# Patient Record
Sex: Female | Born: 1955 | Race: White | Hispanic: No | Smoking: Former smoker
Health system: Southern US, Community
[De-identification: ages and names within clinical notes are randomized; demographics above are authoritative.]

## PROBLEM LIST (undated history)

## (undated) DIAGNOSIS — I639 Cerebral infarction, unspecified: Secondary | ICD-10-CM

## (undated) DIAGNOSIS — J45909 Unspecified asthma, uncomplicated: Secondary | ICD-10-CM

## (undated) DIAGNOSIS — E0590 Thyrotoxicosis, unspecified without thyrotoxic crisis or storm: Secondary | ICD-10-CM

## (undated) DIAGNOSIS — F419 Anxiety disorder, unspecified: Secondary | ICD-10-CM

## (undated) DIAGNOSIS — J309 Allergic rhinitis, unspecified: Secondary | ICD-10-CM

## (undated) DIAGNOSIS — M1990 Unspecified osteoarthritis, unspecified site: Secondary | ICD-10-CM

## (undated) DIAGNOSIS — I219 Acute myocardial infarction, unspecified: Secondary | ICD-10-CM

## (undated) DIAGNOSIS — R569 Unspecified convulsions: Secondary | ICD-10-CM

## (undated) DIAGNOSIS — J449 Chronic obstructive pulmonary disease, unspecified: Secondary | ICD-10-CM

## (undated) DIAGNOSIS — E039 Hypothyroidism, unspecified: Secondary | ICD-10-CM

## (undated) DIAGNOSIS — K219 Gastro-esophageal reflux disease without esophagitis: Secondary | ICD-10-CM

## (undated) DIAGNOSIS — Z90710 Acquired absence of both cervix and uterus: Secondary | ICD-10-CM

## (undated) DIAGNOSIS — R011 Cardiac murmur, unspecified: Secondary | ICD-10-CM

## (undated) DIAGNOSIS — I6529 Occlusion and stenosis of unspecified carotid artery: Secondary | ICD-10-CM

## (undated) DIAGNOSIS — N186 End stage renal disease: Secondary | ICD-10-CM

## (undated) DIAGNOSIS — I2510 Atherosclerotic heart disease of native coronary artery without angina pectoris: Secondary | ICD-10-CM

## (undated) HISTORY — PX: OTHER SURGICAL HISTORY: SHX169

## (undated) HISTORY — DX: Unspecified asthma, uncomplicated: J45.909

## (undated) HISTORY — DX: Allergic rhinitis, unspecified: J30.9

## (undated) HISTORY — PX: APPENDECTOMY: SHX54

## (undated) HISTORY — PX: CORONARY ARTERY BYPASS GRAFT: SHX141

## (undated) HISTORY — DX: Acquired absence of both cervix and uterus: Z90.710

## (undated) HISTORY — DX: Thyrotoxicosis, unspecified without thyrotoxic crisis or storm: E05.90

---

## 2005-04-10 ENCOUNTER — Ambulatory Visit: Payer: Self-pay | Admitting: Family Medicine

## 2005-06-06 ENCOUNTER — Ambulatory Visit: Payer: Self-pay | Admitting: Family Medicine

## 2005-06-07 ENCOUNTER — Inpatient Hospital Stay (HOSPITAL_COMMUNITY): Admission: AD | Admit: 2005-06-07 | Discharge: 2005-06-17 | Payer: Self-pay | Admitting: Internal Medicine

## 2005-06-07 ENCOUNTER — Ambulatory Visit: Payer: Self-pay | Admitting: Internal Medicine

## 2005-06-08 ENCOUNTER — Encounter: Payer: Self-pay | Admitting: Internal Medicine

## 2005-06-08 ENCOUNTER — Encounter: Payer: Self-pay | Admitting: Vascular Surgery

## 2005-06-13 ENCOUNTER — Encounter (INDEPENDENT_AMBULATORY_CARE_PROVIDER_SITE_OTHER): Payer: Self-pay | Admitting: *Deleted

## 2005-06-13 HISTORY — PX: CORONARY ARTERY BYPASS GRAFT: SHX141

## 2005-06-13 HISTORY — PX: CAROTID ENDARTERECTOMY: SUR193

## 2005-07-07 ENCOUNTER — Encounter: Admission: RE | Admit: 2005-07-07 | Discharge: 2005-07-07 | Payer: Self-pay | Admitting: Cardiothoracic Surgery

## 2006-07-09 ENCOUNTER — Ambulatory Visit: Payer: Self-pay | Admitting: Vascular Surgery

## 2006-11-06 IMAGING — CR DG CHEST 2V
2 series · 2 of 2 positions shown · non-contrast
Comparison: none

CLINICAL DATA: Three weeks postop coronary artery bypass grafts.  Reformed smoker.
 TWO VIEW CHEST:
 PA and lateral views of the chest are made and are compared to previous studies of 06/16/05 from [HOSPITAL] and show significant improvement in aeration and clearing of the pleural effusions.  The coronary artery bypass grafts are seen but there is no cardiomegaly or edema.  There is no pneumothorax.  Bony thorax is normal and shows wire sutures in the sternum.

[w chest pa *]
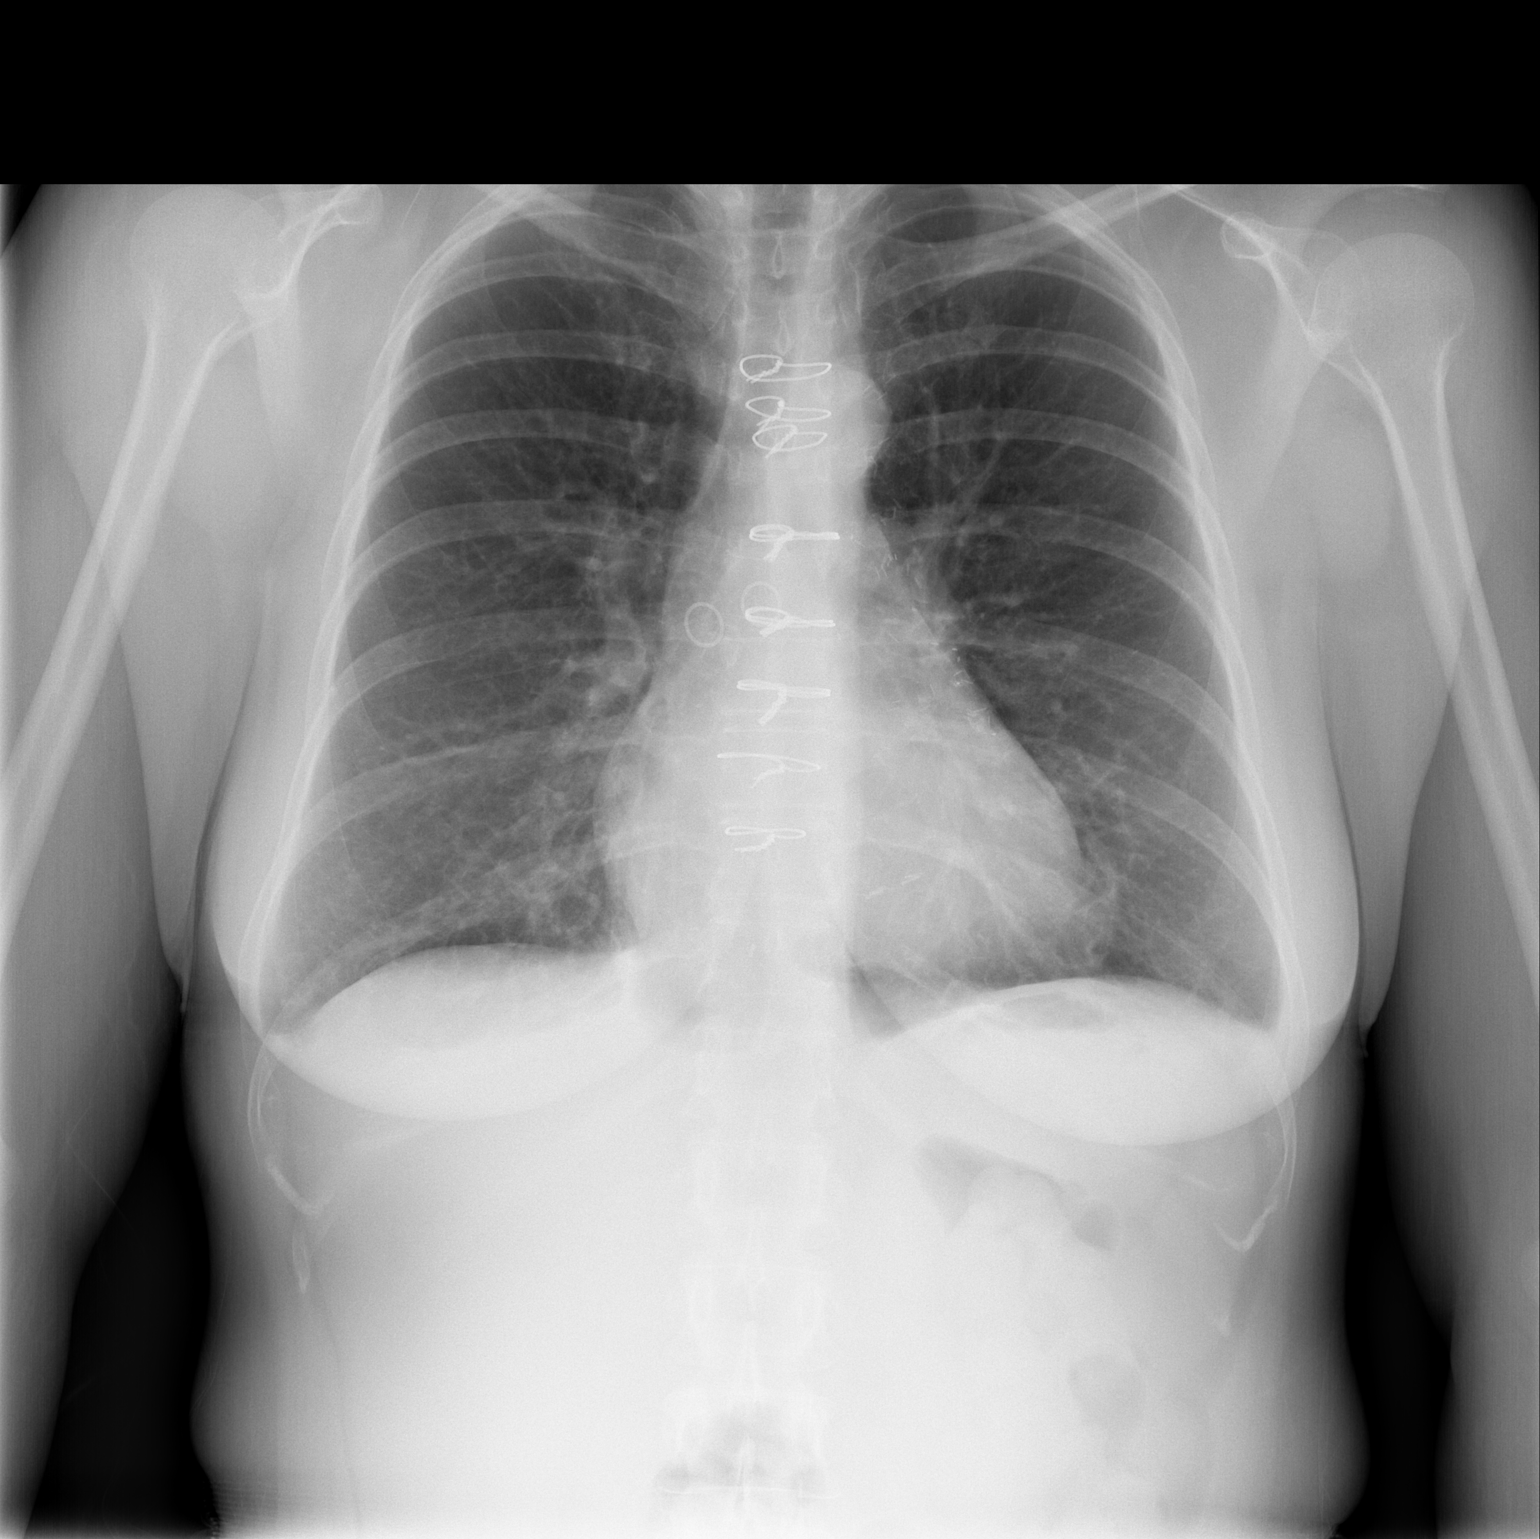

[w chest lat *]
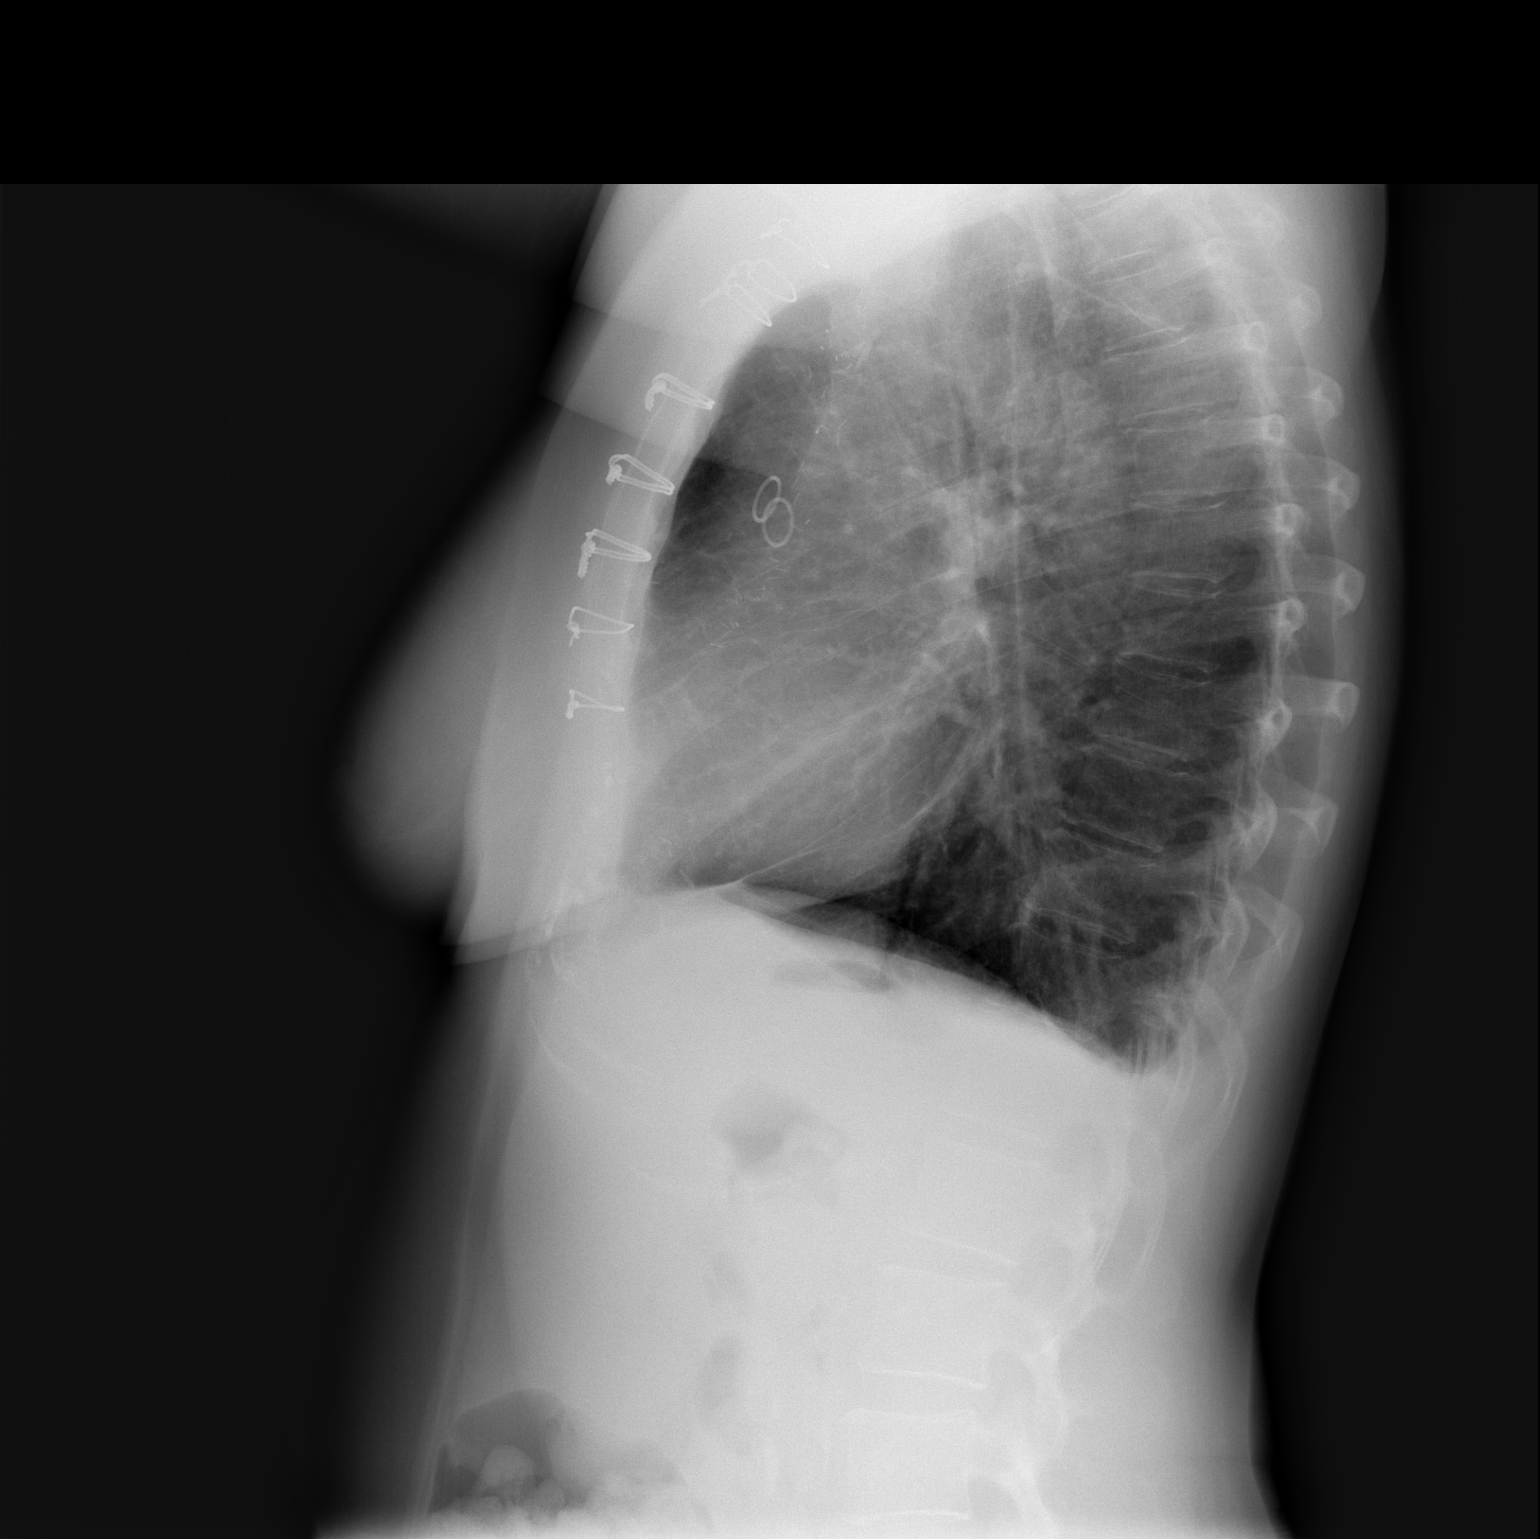

[2 of 2 positions shown; findings below may reference images not displayed]

IMPRESSION: Marked interval improvement in basilar aeration with clearing of the effusions and atelectasis at the base.  No evidence of active cardiopulmonary disease status post coronary artery bypass grafts.

## 2007-07-19 ENCOUNTER — Ambulatory Visit: Payer: Self-pay | Admitting: Vascular Surgery

## 2010-07-26 NOTE — Procedures (Signed)
CAROTID DUPLEX EXAM   INDICATION:  Followup carotid artery disease.   HISTORY:  Diabetes:  No.  Cardiac:  CABG.  Hypertension:  Yes.  Smoking:  Quit.  Previous Surgery:  Right CEA with DPA 06/13/2005.  CV History:  Asymptomatic.  Amaurosis Fugax No, Paresthesias No, Hemiparesis No                                       RIGHT               LEFT  Brachial systolic pressure:         121                 120  Brachial Doppler waveforms:         Triphasic           Triphasic  Vertebral direction of flow:        Antegrade           Antegrade  DUPLEX VELOCITIES (cm/sec)  CCA peak systolic                   82                  90  ECA peak systolic                   154                 [id number]  ICA peak systolic                   140                 [id number]  ICA end diastolic                   52                  48  PLAQUE MORPHOLOGY:                  Homogenous/intimal hyperplasia        Calcified  PLAQUE AMOUNT:                      Mild/moderate       Mild/moderate  PLAQUE LOCATION:                    bifurcation/ICA/ECA ICA/ECA/CCA   IMPRESSION:  Bilateral ICA stenosis of 40-59%, an increase bilaterally  from previous study.       ___________________________________________  Rosetta Posner, M.D.   AS/MEDQ  D:  07/19/2007  T:  07/19/2007  Job:  [id number]

## 2010-07-29 NOTE — H&P (Signed)
Bethany Sosa, Bethany Sosa               ACCOUNT NO.:  [id number]   MEDICAL RECORD NO.:  [id number]          PATIENT TYPE:  INP   LOCATION:  [id number]                         FACILITY:  [facility]   PHYSICIAN:  Glori Bickers, M.D. LHCDATE OF BIRTH:  01/22/1956   DATE OF ADMISSION:  06/07/2005  DATE OF DISCHARGE:                                HISTORY & PHYSICAL   BRIEF HISTORY:  Bethany Sosa is a 55 year old white female who was transferred  via [facility] to [facility] for further cardiac evaluation.   On June 04, 2005 Bethany Sosa got off work around midnight and went to bed  around 2 a.m.  When she laid down she noticed an anterior chest aching.  This did not radiate nor was it associated with shortness of breath, nausea,  vomiting, or diaphoresis.  The discomfort lasted approximately five minutes  that she gave it a 5 on a scale of 0-10.  She states she was able to fall  asleep and she was awakened at least three to four times from sleep with  similar discomfort.  On Tuesday morning she stated that she was tired and  around lunch time her symptoms returned; however, at this time it radiated  into her left arm and bilateral jaws.  She felt it was more intense and gave  an 8 on a scale of 0-10.  She denied any shortness of breath, nausea,  vomiting, or diaphoresis.  She felt that it would occur with exertion and  would be relieved with rest.  Episodes lasted less than five minutes.  She  cannot recall the frequency.  She does recall some belching.  She called her  primary care physician and was seen in the office and admitted to [facility].  While walking into [facility] she had reoccurring chest  discomfort, but has not had any further episodes since that time.   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  Chantix 0.5 mg b.i.d.  2.  Vytorin 10/40 daily.  3.  Levoxyl 0.1 half a tablet daily.  4.  Spiriva  one daily.  5.  Singulair 10 mg daily.  6.  Zegerid 40 mg daily.  7.  Advair 500/50 daily.   MEDICAL HISTORY:  1.  Hyperlipidemia, unknown last checked.  2.  Hypothyroidism since 2005.  3.  Recent bronchitis which was attributed to dust mites allergies.  4.  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  She denies any      rheumatic heart disease.   SURGICAL HISTORY:  Hysterectomy secondary to cervical cancer.  She denies  any history of diabetes, hypertension, CVA, myocardial infarction, COPD, or  bleeding dyscrasias.   SOCIAL HISTORY:  She resides in [place] with husband and is raising her 92-  year-old grandson.  Her husband is with end-stage liver cancer.  She is  employed as a Biomedical scientist.  She has two sons, one daughter  alive and well and six grandchildren alive and well.  She  smokes  approximately one pack per day for 30 years; however, she states that since  January she has cut this down to a few cigarettes per day.  She denies any  alcohol, drugs, herbal medications.  She follows an ADA diet as that her  husband is diabetic.  She does not exercise.   FAMILY HISTORY:  Notable for the health of her mother at age 48 with a  history of hypertension, her father at 19 with diabetes.  She has two  brothers alive and well.   REVIEW OF SYSTEMS:  In addition to above is notable for glasses,  postmenopausal, arthralgias in her legs, difficult to determine if she has  any claudication symptoms.  All other points are negative.   PHYSICAL EXAMINATION:  GENERAL:  Well-nourished, well-developed, pleasant  white female in no apparent distress.  VITAL SIGNS:  Temperature 98.5, blood pressure 93/64, pulse 76 and regular,  respirations 18, regular.  99% saturations on room air.  HEENT:  Unremarkable.  NECK:  Supple without thyromegaly, adenopathy, or JVD.  She has bilateral  bruits, left greater than the right.  LUNGS:  Symmetrical excursion.  Clear to  auscultation without rales,  rhonchi, or wheezing.  HEART:  Distant heart sounds.  Regular rate and rhythm.  I appreciate a 2/6  systolic murmur best appreciated at the upper right and left sternal  borders.  SKIN/INTEGUMENT:  Intact.  ABDOMEN:  Bowel sounds present without organomegaly, masses, or tenderness.  EXTREMITIES:  Negative cyanosis, clubbing, or edema.  Femoral pulses are 4+  without bruits.  Pedal pulses 2+ on the right, 1+ on the left.  MUSCULOSKELETAL:  Unremarkable.  NEUROLOGIC:  Unremarkable.   Chest x-ray from [facility] did not show any active disease.  EKG showed  normal sinus rhythm, normal intervals.  Old EKGs are not available for  comparison.  H&H is 12.3 and 35, normal indices, platelet 310.  WBCs 8.5.  Sodium 142, potassium 3.7, BUN 11, creatinine 0.9.  Normal LFTs.  CKs,  MBs,  and troponin x3 were negative at [facility].  TSH is elevated 51.06.  PTT 31.5, PT 9.6.  Urinalysis is unremarkable.   IMPRESSION:  1.  Acute coronary syndrome, transfer from [facility] for cardiac      catheterization.  2.  Tobacco use; however, she is currently trying to quit.  3.  Hypothyroidism with an elevated TSH of 51.  It is noted that at [facility] her Synthroid was increased from 0.5 prior to admission to      0.125.  4.  History as noted per past medical history.   DISPOSITION:  We will begin aspirin, continue her transfer and home  medications.  However, we will change her nitroglycerin paste to IV  nitroglycerin.  We will obtain carotid Dopplers and echocardiogram to  further evaluate her murmur and bilateral carotid bruits.  Cardiac  catheterization will be performed on June 08, 2005.  Tobacco cessation  consult has already been performed.  Further plan will be pending results.      Sharyl Nimrod, P.A. [facility]      Glori Bickers, M.D. [facility]  Electronically Signed    EW/MEDQ  D:  06/07/2005  T:  06/08/2005  Job:  [id number]  cc:   Mardene Celeste  A. Rocky Link, M.D.  [phone]   Malkiat Dhatt, M.D.  Fax: [phone]   Jiles Prows, M.D.  Fax: [phone]

## 2010-07-29 NOTE — Op Note (Signed)
Bethany Sosa, Bethany Sosa               ACCOUNT NO.:  [id number]   MEDICAL RECORD NO.:  [id number]          PATIENT TYPE:  INP   LOCATION:  2314                         FACILITY:  [facility]   PHYSICIAN:  Ivin Poot, M.D.  DATE OF BIRTH:  1955-08-28   DATE OF PROCEDURE:  06/13/2005  DATE OF DISCHARGE:                                 OPERATIVE REPORT   OPERATION:  Coronary artery bypass grafting x4 (left internal mammary artery  to the LAD, saphenous vein graft to obtuse marginal, sequential saphenous  vein graft to right coronary artery and posterior descending).   PRE-AND-POSTOPERATIVE DIAGNOSIS:  Class IV unstable angina with severe 3-  vessel coronary artery disease, critical right carotid stenosis.   SURGEON:  Ivin Poot, M.D.   ASSISTANT:  Suzzanne Cloud, PA-C   ANESTHESIA:  General.   INDICATIONS:  The patient is a 55 year old female smoker who presented with  symptoms of unstable angina.  Cardiac catheterization demonstrated severe 3-  vessel coronary artery disease with preserved LV function.  Preoperative  Dopplers indicated bilateral carotid artery stenosis with right carotid  stenosis critical at over 90%.  The patient was felt to be candidate for  combined cabbage and right carotid endarterectomy.   Prior to surgery I examined the patient and reviewed the results of the  cardiac catheterization with the patient and her family.  I discussed the  indications and expected benefits of coronary bypass surgery for treatment  of her coronary disease.  I reviewed, as well, the alternatives to surgical  therapy for treatment of her coronary disease.  I discussed with the  patient, and family the major aspects of the planned procedure including the  choice of conduit to include mammary artery and endoscopically harvested  vein, the  location of the surgical incisions, the use of general anesthesia  and cardiopulmonary bypass, and the expected postoperative hospital  recovery.   I also reviewed with the patient the risks to her of coronary  artery bypass surgery.  The right risks of MI, CVA, bleeding, infection,  blood transfusion requirement, and death.  She understood these implications  for the surgery and agreed to proceed with the operation as planned under  what, I felt, was an informed consent.   OPERATIVE FINDINGS:  The patient had a combined cabbage right carotid  endarterectomy.  The carotid endarterectomy was performed by Dr. Sherren Mocha Early  who will dictate the procedure in a separate document.  The patient was  given Amicar after the carotid endarterectomy was completed to help with the  bleeding complications.  The patient presented to the operating room with a  hematocrit of 27%; and received 2 units of packed cells at the onset of the  case.  The patient had a post pump platelet count of 125,000 and received a  unit of platelets after reversal of heparin with protamine.  The patient's  saphenous vein was harvested endoscopically from the right leg and was of  good quality.  The posterior descending was a very small targets as was the  LAD.  The other vessels were adequate targets for  grafts.  The patient has a  significant smoking induced lung disease.   PROCEDURE:  The patient was brought to operating room and placed supine on  the operating table.  A general anesthesia was induced under invasive  hemodynamic monitoring.  The chest, abdomen, and legs were prepped with  Betadine and draped as a sterile field.  The right neck was prepped and a  right carotid endarterectomy was performed initially by Dr. Sherren Mocha early,  which will be dictated in a separate operative document.  When the carotid  endarterectomy was completed, a sternal incision was made as the saphenous  vein was harvested endoscopically from the right leg.  The left internal  mammary artery was harvested as a pedicle graft from its origin at the  subclavian vessels; and was a good vessel  with excellent flow.  Heparin was  administered and the ACT was documented as being therapeutic.  The sternal  retractor was placed and the pericardium was opened and suspended.  Pursestrings were placed through the ascending aorta and the right atrium  and the patient was cannulated and placed on bypass.  Cardioplegia catheters  were placed as well for antegrade aortic and retrograde coronary sinus  cardioplegia.  The coronaries were identified for grafting in the mammary  artery and vein grafts were prepared for the distal anastomoses.  The  patient was cooled to 30 degrees.  The aortic crossclamp was applied.  Then  800 mL of cold blood cardioplegia was delivered, in split doses, between the  antegrade aortic and retrograde coronary sinus catheters.  There is a good  cardioplegic arrest and septal temperature dropped less than 12 degrees.  Topical iced saline was used to augment myocardial preservation.   The distal coronary anastomoses were then performed.  The first distal  anastomosis was a sequential vein graft to the right coronary artery  continuing to the posterior descending.  The right coronary was a 1.75 mm  vessel approximately 80% stenosis.  A side-to-side anastomosis with the  reverse saphenous vein was constructed using a running 7-0 Prolene.  The  second distal anastomosis was a continuation of this sequential vein graft  to the posterior descending.  There was a smaller 1.2 mm vessel with  approximately 90% stenosis at its origin from the right coronary.  An end-to-  side anastomosis using running 8-0 Prolene was used to construct this graft.  Cardioplegia was redosed.  The third distal anastomosis was the obtuse  marginal with the circumflex.  This was a 1.5 mm vessel with approximately  90% stenosis.  The reverse saphenous vein was sewn end-to-side with running 7-0 Prolene.  There was good flow through the graft.  The fourth distal  anastomosis was the distal third to  the LAD.  This was a 1.5 mm vessel  approximately 90% stenosis.  The left IMA pedicle was brought through an  opening created in the left lateral pericardium; and was brought down onto  the LAD, and sewn end-to-side with running 8-0 Prolene.  There was good flow  through the anastomosis with immediate rise in septal temperature after  release of the pedicle clamp on the mammary artery.  The mammary pedicle  bulldog was replaced and the pedicle was secured to the epicardium with 6-0  Prolene.   Cardioplegia was redosed.  While the crossclamp was still in place, 2  proximal vein anastomoses were placed in the ascending aorta using a 4.0 mm  punch with a running 6-0 Prolene.  Prior  to release of the crossclamp, air  was vented from the coronaries and the left side of heart using a dose of  retrograde warm blood cardioplegia; and the usual de-airing maneuvers on  bypass.  The patient was reperfused as the crossclamp was removed.  The  heart resumed a spontaneous rhythm.  Air was aspirated from the vein grafts  with a 27-gauge needle and the grafts were opened; and each had good flow.  Hemostasis was documented at the proximal and distal anastomoses.  The  patient was rewarmed and reperfused.  Temporary pacing wires were applied.  The lungs were re-expanded and the ventilator was resumed.  The patient was  then weaned from bypass without inotropes with stable blood pressure and  good cardiac output.  Protamine was administered without adverse reaction.  The cannulas were removed.  The mediastinum was irrigated with warm  antibiotic irrigation.  The leg incision was irrigated in a standard  fashion, and closed.  The superior mediastinal fat was closed over the aorta  and vein grafts.  Two mediastinal and a left pleural chest tube were placed  and brought out through separate incisions.  The sternum was closed with  interrupted steel wire.  The pectoralis fascia was closed with a running #1   Vicryl.  The subcutaneous and skin layers were closed with a running Vicryl  and sterile dressings were applied.   The right neck incision was then inspected, irrigated, and hemostasis was  achieved.  The fascial layer was closed with an interrupted 2-0 Vicryl.  The  subcutaneous was closed with a running 3-0 Vicryl; and the skin was closed  with a subcuticular.  Sterile dressings were applied.  The patient then  returned to the intensive care unit in stable condition.  Total  cardiopulmonary bypass time was 110 minutes with crossclamp time of 70  minutes.      Ivin Poot, M.D.  Electronically Signed     PV/MEDQ  D:  06/13/2005  T:  06/14/2005  Job:  [id number]   cc:   Glori Bickers, M.D. [place] [id number]   CVTS Office

## 2010-07-29 NOTE — Op Note (Signed)
NAMEHONESTY, SCHWALLIE               ACCOUNT NO.:  [id number]   MEDICAL RECORD NO.:  [id number]          PATIENT TYPE:  INP   LOCATION:  3799                         FACILITY:  [facility]   PHYSICIAN:  Rosetta Posner, M.D.    DATE OF BIRTH:  [birth year]   DATE OF PROCEDURE:  06/13/2005  DATE OF DISCHARGE:                                 OPERATIVE REPORT   PREOPERATIVE DIAGNOSIS:  Severe asymptomatic right internal carotid artery  stenosis and severe coronary disease.   POSTOPERATIVE DIAGNOSIS:  Severe asymptomatic right internal carotid artery  stenosis and severe coronary disease.   PROCEDURE:  Right carotid endarterectomy and Dacron patch angioplasty  followed by coronary bypass grafting.   SURGEON:  Rosetta Posner, M.D.   ASSISTANT:  Ivin Poot, M.D.  John Giovanni, P.A.-C.   ANESTHESIA:  General tracheal.   COMPLICATIONS:  None.   DISPOSITION:  The patient will next undergo coronary bypass graft which will  be dictated as a separate note by Dr. Prescott Gum.   PROCEDURE IN DETAIL:  The patient was taken to the operating room and placed  in the supine position where the area of the right neck, chest, and both  legs were prepped and draped in a sterile fashion.  An incision was made  anterior to the sternocleidomastoid and carried down to the platysma with  electrocautery.  The sternocleidomastoid was reflected posteriorly and the  carotid sheath was opened.  The facial vein was ligated with 2-0 silk ties  divided.  The common carotid artery was encircled with an umbilical tape and  Rumel tourniquet.  Dissection was continued on to the bifurcation.  The  superior thyroid artery was encircled with a 2-0 silk Potts tie, the  external carotid was encircled with a blue vessel loop, and the internal  carotid was encircled with an umbilical tape and Rumel tourniquet.  The  vagus hypoglossal nerves were identified and preserved.  The patient was  given 7000 units of intravenous  heparin.  After adequate circulation time,  the internal, external, and common carotid arteries were occluded.  The  common carotid artery was opened with an 11 blade sewn and extended with  Potts scissors through the plaque onto the internal carotid Potts scissors.  A 10 shunt was passed up the internal carotid artery, allowed to back bleed,  and then down the common carotid where it was secured with Rumel  tourniquets.  The endarterectomy was begun on the common carotid artery and  the plaques divided proximally with Potts scissors.  The endarterectomy was  continued onto the artery syndrome bifurcation.  The external carotid was  then endarterectomized with an eversion technique.  The internal carotid was  opened.  Endarterectomy was performed in an open fashion.  Remaining  atheromatous debris was removed.  A Finesse Hemashield Dacron patch was  brought onto the field and sewn as a patch angioplasty with a running 6-0  Prolene suture.  Prior to completion of the anastomosis, the shunt was  removed and the usual flushing maneuvers were undertaken.  The anastomosis  was then completed  and the external followed by the common and finally the  internal carotid occlusion clamp was removed.  Excellent flow  characteristics were noted with handheld Doppler in the internal and  external carotid arteries.  The patient had excellent Doppler flow in the  internal and external carotid arteries.  The wounds were irrigated with  saline.  The wound was temporarily closed with 2-0 nylon mattress sutures  over a Ray-Tec. At the completion of the procedure, the patient had coronary  artery bypass grafting which will be dictated as a separate note.  AT the  completion of this, the neck wound was irrigated and closed in a standard  fashion.      Rosetta Posner, M.D.  Electronically Signed     TFE/MEDQ  D:  06/13/2005  T:  06/13/2005  Job:  [id number]   cc:   Glori Bickers, M.D. [place]  [place] [address]

## 2010-07-29 NOTE — Discharge Summary (Signed)
Bethany Sosa, Bethany Sosa               ACCOUNT NO.:  [id number]   MEDICAL RECORD NO.:  [id number]          PATIENT TYPE:  INP   LOCATION:  2040                         FACILITY:  [facility]   PHYSICIAN:  Ivin Poot, M.D.  DATE OF BIRTH:  1955-07-24   DATE OF ADMISSION:  06/07/2005  DATE OF DISCHARGE:  06/17/2005                                 DISCHARGE SUMMARY   ADMISSION DIAGNOSES:  1.  Acute coronary syndrome.  2.  Ongoing tobacco use.  3.  Hypothyroidism with elevated thyroid-stimulating hormones of 51.   DISCHARGE/SECONDARY DIAGNOSES:  1.  Class IV unstable angina with severe three vessel coronary artery      disease, status post coronary artery bypass graft.  2.  Severe asymptomatic right internal carotid artery stenosis, status post      right carotid endarterectomy.  3.  Chronic obstructive pulmonary disease with active tobacco use, status      post smoking cessation consult and on Chantix at admission.  4.  Allergic bronchitis on inhalers.  5.  Status post hysterectomy secondary to cervical cancer.  6.  Hyperlipidemia.  7.  Hypothyroidism with TSH of 51 on admission with Levoxyl dose increased      this hospitalization.  8.  Gastrointestinal intolerance to codeine.   PROCEDURES:  1.  June 13, 2005, coronary artery bypass grafting x4 using left internal      mammary artery to the left anterior descending artery, saphenous vein      graft to the obtuse marginal, sequential saphenous vein graft to the      right coronary artery and posterior descending.  Surgeon Dr. Tharon Aquas      Trigt.  2.  June 13, 2005, right carotid endarterectomy with Dacron patch      angioplasty (with combined coronary artery bypass grafting).  Surgeon      Dr. Arvilla Meres. Early.  3.  June 09, 2005, cardiac catheterization by Dr. Glori Bickers.  4.  June 09, 2005, ankle-branchial indices greater than 1.0 bilaterally.  5.  June 08, 2005, carotid Duplex showing greater than 80% internal carotid   artery stenosis.  There is also external carotid artery stenosis.      Vertebral arteries are antegrade.  6.  June 08, 2005, 2-D echocardiogram showing overall left ventricular      systolic function normal with an ejection fraction estimated at 65%.   BRIEF HISTORY:  Bethany Sosa is a 55 year old Caucasian female who was  transferred to [facility] from [facility] on June 07, 2005.  On June 04, 2005, she went to bed around 2 a.m. and began having anterior  chest aching which did not radiate and was not associated with shortness of  breath, nausea, vomiting or diaphoresis.  The discomfort lasted about five  minutes.  She eventually fell asleep but awoke at least three or four times  during the night with similar discomfort.  The next morning, she had similar  pain but was more intense and this time with radiation to her left arm and  bilateral jaws.  She called her primary physician and  was seen in the office  and admitted to [facility].  She was started on IV nitroglycerin and  transferred to [facility] for further evaluation and treatment.   HOSPITAL COURSE:  Bethany Sosa was admitted to [facility] on June 07, 2005.  She was admitted for acute coronary syndrome and had initially been  evaluated in [place] where her enzymes were negative but was placed on IV  nitroglycerin and heparin.  Her EKG showed normal sinus rhythm by report.   Once at [facility], she was initially admitted under [place],  cardiologist Dr. Glori Bickers.  She ultimately underwent cardiac  catheterization which demonstrated ejection fraction 60%, 80% stenosis of  her mid-right coronary with 70% stenosis at the origin of the posterior  descending.  She had 50% left main and 80% stenosis of the LAD with 50%  stenosis of her circumflex.  She was also noted to have bilateral renal  artery stenosis with 80% stenosis of the right renal artery and 50% stenosis  of the  left renal artery.  A 2-D echocardiogram was also performed showing  normal LV systolic function without significant valvular disease.  She was  referred to cardiac surgeon, Dr. Tharon Aquas Trigt, for consideration of  coronary revascularization.  At that time, she also had a carotid duplex  showing bilateral internal carotid artery, right greater than left.  After  being evaluated by Dr. Tharon Aquas Trigt, he did feel she was a candidate for  coronary artery bypass graft surgery with combined right carotid  endarterectomy.  She was seen by vascular surgeon, Dr. Arvilla Meres Early, who  agreed she would benefit from carotid surgery as well.  She was ultimately  taken to the operating room on June 13, 2005.  At the time of dictation, her  postoperative course has been relatively uneventful.  She was treated for  some postoperative fluid volume excess with her weight up about 10 pounds  but has been diuresing well with short-term Lasix.  Her creatinine has  remained stable at 0.6.  As mentioned earlier, she did have an elevated TSH  on admission with her Levoxyl adjusted appropriately by cardiology.  Neurologically, she has remained stable and is noted to have no dysphagia or  weakness.   She had been transferred out of the surgical care unit to telemetry unit  2000 by postoperative day two.  She has maintained normal sinus rhythm with  her heart rate in the 90s.  She was then weaned from supplemental oxygen  saturating above 92%.  Her blood pressure has been tolerating lower dose  metoprolol but ACE inhibitor has been held since her systolic blood pressure  has been between 100 and 115.  Her chest tubes were removed by postoperative  day one with follow-up chest x-ray showing tiny right apical pneumothorax  which remained stable and bibasilar atelectasis for which aggressive  pulmonary toilet was encouraged.  She has been able to void after removal of her Foley catheter and has been able to ambulate  independently.  Her  incisions appear to be healing well without signs of infection.  At this  time, her bowel function is starting to return and she has had no nausea,  vomiting or distension.  She did undergo a smoking cessation consult since  she had a history of tobacco use and will continue to work on smoking  cessation for which she is also in a program in [place].  If she continues  to make steady progress, it is anticipated that she will be ready to be  discharged home by postoperative day four or five which would be April 7 or  June 18, 2005.   LABORATORY DATA:  Recent labs show white blood count of 11.5 (WBC still  pending), hemoglobin 12.9, hematocrit 37.2, platelet count 231,000.  Sodium  137, potassium 4.2, chloride 105, CO2 25, BUN 8, creatinine 0.6, blood  glucose 92.  AST 35, ALT 36, alkaline phosphate 68, total bilirubin 0.7,  albumin 3.5.  TSH 51.7, T3 on June 11, 2005 (with plans for her to follow up  with her primary physician).  Hemoglobin A1c was normal at 5.2.   DISCHARGE MEDICATIONS:  1.  Aspirin 81 mg p.o. daily.  2.  Lopressor 25 mg p.o. b.i.d.  3.  Vytorin 10/40 mg p.o. daily.  4.  Advair 500/50 mcg 1 dose inhaled b.i.d.  5.  Singulair 10 mg p.o. daily.  6.  Spiriva 18 mcg inhaled daily.  7.  Chantix 0.5 mg p.o. b.i.d.  8.  Zegerid 40 mg p.o. daily.  9.  Levoxyl 0.1 mg p.o. daily.  10. Percocet 5/325 mg 1-2 tablets p.o. q.4-6h. p.r.n. pain.   DISCHARGE INSTRUCTIONS:  She is instructed to avoid driving or heavy lifting  more than 10 pounds.  She is encouraged to continue daily walking, breathing  exercises.  She is to follow a low-fat, low-salt diet.  She may shower and  clean her incisions gently with mild soap and water but she is to notify the  CVTS office if she develops fever greater than 101, redness or drainage from  her incision site, or increase in edema or shortness of breath.   FOLLOW UP:  She is to call and schedule a two-week follow up with  her  cardiologist, Dr. Pati Gallo.  She is to follow up with Dr. Prescott Gum at the [place]  office in approximately six weeks.  The CVTS office will contact her  regarding the specific appointment date and time. Follow up with Dr. Donnetta Hutching  for her carotid artery disease will be discussed at that time.  She should  also call and schedule a follow-up with her primary care physician for  further management of her hypothyroidism.      Jacinta Shoe, P.A.      Ivin Poot, M.D.  Electronically Signed    AWZ/MEDQ  D:  06/16/2005  T:  06/17/2005  Job:  [id number]   cc:   Patient's chart   Ivin Poot, M.D.  [address]  [place]  [place] [address]   Rosetta Posner, M.D.  [address]  [place]  [place] [address]   Glori Bickers, M.D. [place] [id number]   Patricia A. Rocky Link, M.D.  [phone]   Malkiat Dhatt, M.D.  Fax: [phone]   Jiles Prows, M.D. Fax: [phone]

## 2010-07-29 NOTE — Cardiovascular Report (Signed)
NAMEMARTELLA, MEDINE               ACCOUNT NO.:  [id number]   MEDICAL RECORD NO.:  [id number]          PATIENT TYPE:  INP   LOCATION:  [id number]                         FACILITY:  [facility]   PHYSICIAN:  Glori Bickers, M.D. LHCDATE OF BIRTH:  Oct 05, 1955   DATE OF PROCEDURE:  06/09/2005  DATE OF DISCHARGE:                              CARDIAC CATHETERIZATION   PATIENT IDENTIFICATION:  Bethany Sosa is a very pleasant 55 year old woman with  a long history of tobacco use as well as hyperlipidemia, who was transferred  from [facility] for unstable angina.  She ruled out for a myocardial  infarction with serial cardiac markers and is referred for cardiac  catheterization.   PROCEDURES PERFORMED:  1.  Selective coronary angiography.  2.  Left heart catheterization.  3.  Left ventriculogram.  4.  Abdominal aortogram.  5.  Left subclavian angiography.   DESCRIPTION OF PROCEDURE:  The risks and benefits of catheterization were  explained to Ms. Bertling and her family.  Consent was signed and placed on the  chart.  A 6-French arterial sheath was placed in the right femoral artery  using modified Seldinger technique.  Initially we used a 6-French JL-4  catheter to engage the left main.  However, we had significant damping and  thus we switched out for a 5-French JL 3.5, which fit nicely but had some  mild ventricularization of the wave form.  A 6-French JR-4 and a 6-French  angled pigtail were used for the remainder of the catheterization.  All  catheter exchanges were made over a wire.  There were no apparent  complications.   Central aortic pressure is 107/62 with a mean of 80.  LV pressure is 114/11  with an EDP of 16.  There is no aortic stenosis.   Left main had a 50% ostial stenosis.  There was damping with a 6-French  catheter.  With a 5-French catheter, there was just minimal  ventricularization of pressures.   LAD was a long vessel coursing to the apex.  It was a heavily diseased  throughout.  There was a 70% tubular lesion proximally.  In the midportion  there was a long 90% lesion followed by a 60% tubular lesion.  This crossed  the span of four small diagonals.  There were two large septal perforators,  each with 90% stenosis in their ostium.   Left circumflex was a large vessel made up primarily of a large branching OM-  1.  There was a small ramus.  In the proximal left circumflex there was a  40% tubular stenosis leading into the ostium of the OM-1.  In the distal  branch of the OM-1 there was a 95% ostial stenosis.  The ramus was small  with a diffuse 50 proximally.   Right coronary artery was a large, dominant vessel.  It gave off a large PDA  and three large posterolaterals.  The proximal section to the midsection was  diffusely diseased with about 50-60% diffuse lesions.  There was a focal 80%  lesion and a focal 70% lesion in the midsection.  In the ostium of the  PDA,  there is a focal 70-80% stenosis.   Left ventriculogram done the RAO position projection showed an EF of 60%  with no wall motion abnormalities or mitral regurgitation.   Abdominal aortogram showed moderate aortoiliac disease.  There was an 80%  stenosis in the proximal right renal artery and a 50% stenosis on the left.   Left subclavian injection showed that the subclavian was patent without any  significant obstruction.  The LIMA was patent to the chest wall.   ASSESSMENT:  1.  Severe three-vessel coronary artery disease as described above.  2.  Normal left ventricular function.  3.  Severe peripheral arterial disease with bilateral renal artery stenosis      and bilateral 80% carotid stenosis by a recent ultrasound.   PLAN/DISCUSSION:  She will need a CVTS consultation for possible CABG and  carotid endarterectomy.      Glori Bickers, M.D. [facility]  Electronically Signed     DB/MEDQ  D:  06/09/2005  T:  06/10/2005  Job:  [id number]   cc:   Mardene Celeste A. Rocky Link, M.D.  [phone]   Malkiat Dhatt, M.D.  Fax: [phone]

## 2010-07-29 NOTE — Consult Note (Signed)
Bethany Sosa, Bethany Sosa               ACCOUNT NO.:  [id number]   MEDICAL RECORD NO.:  [id number]          PATIENT TYPE:  INP   LOCATION:  [id number]                         FACILITY:  [facility]   PHYSICIAN:  Ivin Poot, M.D.  DATE OF BIRTH:  12-Mar-1956   DATE OF CONSULTATION:  06/09/2005  DATE OF DISCHARGE:                                   CONSULTATION   PHYSICIAN REQUESTING CONSULTATION:  Dr. Haroldine Laws.   PRIMARY CARDIOLOGIST:  Dr. Pati Gallo in [place].   CONSULTANT:  Tharon Aquas Trigt.   PRIMARY CARE PHYSICIAN:  Dr. Rocky Link in [place].   REASON FOR CONSULTATION:  Left main and three-vessel coronary artery  disease, unstable angina.   CHIEF COMPLAINT:  Chest pain.   HISTORY OF PRESENT ILLNESS:  I was asked to evaluate this 55 year old white  female smoker for potential surgical coronary revascularization for recently  diagnosed severe coronary artery disease.  The patient was admitted to the  hospital Monday night and early Tuesday morning after having nocturnal  angina which persisted. She had no prior history of coronary disease or  myocardial infarction.  She was evaluated in [place] where her enzymes were  negative and she was subsequently transferred to [facility] where  she was placed on nitroglycerin and heparin.  A 2-D echocardiogram was  performed which showed normal LV systolic function without significant  valvular disease.  She had bilateral carotid bruits and duplex carotid  ultrasound demonstrated high-grade greater than 80% stenosis of the internal  carotid arteries with the right side being worse than left.  She  subsequently underwent left heart cath today by Dr. Haroldine Laws.  This  demonstrated EF of 60%.  She had an 80% stenosis of the mid right coronary  with a 70% stenosis at the origin of the posterior descending.  She had a  50% left main and 80% stenosis of the LAD.  Her circumflex had a 50%  stenosis.  She is also noted to have bilateral renal artery  stenosis with  80% stenosis of the right renal artery and 50% stenosis of the left renal  artery.  Based on the patient's coronary anatomy and symptoms, she is felt  to be candidate for surgical coronary revascularization and possible  combined carotid endarterectomy with CABG.  She is currently stable on  Lovenox and nitroglycerin.   PAST MEDICAL HISTORY:  1.  COPD with active smoking one pack a day.  2.  Allergic bronchitis on inhalers.  3.  Status post hysterectomy.  4.  Hyperlipidemia.  5.  Hypothyroidism.  6.  GI intolerance to CODEINE.   MEDICATIONS:  1.  Vytorin 10/40 p.o. daily.  2.  Levoxyl 0.05 mg p.o. daily.  3.  Spiriva 1 p.o. q.d.  4.  Singulair 10 mg daily.  5.  Zegerid 40 mg daily.  6.  Lopressor 25 mg b.i.d.  7.  Zetia 10 mg q.d.  8.  Zocor 40 mg a day.  9.  Chantix 0.5 mg b.i.d.  10. Lovenox.  11. Nitroglycerin.   SOCIAL HISTORY:  The patient is married lives in [place].  She is  a Insurance account manager  in [employer]  She smokes pack of cigarettes a day and does  not use alcohol.   FAMILY HISTORY:  Positive for hypertension.  Negative for premature coronary  artery disease.  Positive for diabetes.   REVIEW OF SYSTEMS:  Constitutional review is negative for fever or weight  loss. ENT review is negative difficulty swallowing, change in vision.  She  has upper dental plates.  Pulmonary review is positive for active smoking,  COPD.  No history of pulmonary nodule on chest x-ray and she did have a  recent course of oral antibiotics this past winter for bronchitis.  Cardiac  is positive unstable angina, three-vessel coronary disease and left main  stenosis.  Review is positive for bilateral renal artery stenosis, moderate  and bilateral carotid artery stenosis.  There is no history of DVT, TIA or  claudication.  Neurologic is negative stroke or seizure.  GI review is  negative for blood per rectum, hepatitis, jaundice or ulcer disease.  Endocrine review is  positive thyroid disease.  Negative for diabetes.  Hematologic review is negative bleeding disorder or blood transfusion.   PHYSICAL EXAMINATION:  The patient is 5 feet tall and weighs 145 pounds  blood.  Blood pressure 100/70, pulse 64, temperature 99.1, saturation on  room air 97%.  General appearance is of a middle-aged white female in her  hospital room comfortable in no acute distress accompanied by her daughter.  HEENT exam is normocephalic.  Full EOMs.  Pharynx clear.  Dentition  adequate.  Neck without JVD, mass or thyromegaly.  She has bilateral carotid  bruits, right greater than left.  Lymphatics reveal no palpable  supraclavicular or axillary adenopathy.  Thorax is without deformity.  She  has distant breath sounds.  No active wheezing.  Cardiac exam reveals a soft  grade 1/6 systolic murmur, otherwise no S3 gallop, no rub or abnormal PMI.  Abdominal exam is soft, nontender without organomegaly, abdominal bruit.  Bowel sounds are present.  Peripheral pulses are intact in all extremities.  There is no evidence venous insufficiency.  She has a compression dressing  in the right groin without evidence of hematoma.  Skin is without rash or  lesion.  Neurologic exam is alert and oriented without focal motor deficit.   LABORATORY DATA:  Hematocrit 35%, platelet count 300,000.  Creatinine 0.9.  Sodium 142.  Cardiac enzymes were negative.  Chest x-ray shows no active  disease.  Her coronary arteriograms are reviewed with Dr. Haroldine Laws and she  has severe left main and three-vessel coronary disease with preserved LV  function.   IMPRESSION/PLAN:  The patient has severe left main and three-vessel disease  and would benefit from surgical revascularization of her left anterior  descending artery, circumflex, right coronary and posterior descending  vessels.  She will probably also need combined carotid endarterectomy and a vascular evaluation is pending.  The patient will be scheduled  for her  surgery next week.  The plan for her evaluation, treatment was reviewed with  the patient and daughter and all questions addressed.  Thank you for the  consultation.     Ivin Poot, M.D.  Electronically Signed    PV/MEDQ  D:  06/09/2005  T:  06/11/2005  Job:  [id number]

## 2011-10-26 ENCOUNTER — Emergency Department (HOSPITAL_COMMUNITY)
Admission: EM | Admit: 2011-10-26 | Discharge: 2011-10-27 | Disposition: A | Discharge status: Home / Self Care | Payer: Medicaid Other | Attending: Emergency Medicine | Admitting: Emergency Medicine

## 2011-10-26 ENCOUNTER — Emergency Department (HOSPITAL_COMMUNITY): Payer: Medicaid Other

## 2011-10-26 ENCOUNTER — Encounter (HOSPITAL_COMMUNITY): Payer: Self-pay | Admitting: Adult Health

## 2011-10-26 DIAGNOSIS — R0789 Other chest pain: Secondary | ICD-10-CM

## 2011-10-26 HISTORY — DX: Chronic obstructive pulmonary disease, unspecified: J44.9

## 2011-10-26 HISTORY — DX: Atherosclerotic heart disease of native coronary artery without angina pectoris: I25.10

## 2011-10-26 LAB — BASIC METABOLIC PANEL
CO2: 23 mEq/L (ref 19–32)
Calcium: 9.2 mg/dL (ref 8.4–10.5)
Chloride: 102 mEq/L (ref 96–112)
GFR calc non Af Amer: 68 mL/min — ABNORMAL LOW (ref >90)
Glucose, Bld: 99 mg/dL (ref 70–99)
Potassium: 3.6 mEq/L (ref 3.5–5.1)
Sodium: 137 mEq/L (ref 135–145)

## 2011-10-26 LAB — POCT I-STAT TROPONIN I: Troponin i, poc: 0 ng/mL (ref 0.00–0.08)

## 2011-10-26 LAB — CBC
HCT: 36.8 % (ref 36.0–46.0)
Platelets: 239 10*3/uL (ref 150–400)

## 2011-10-26 MED ORDER — NITROGLYCERIN 0.4 MG SL SUBL: 0.4000 mg | SUBLINGUAL_TABLET | SUBLINGUAL | Status: DC | PRN

## 2011-10-26 MED ORDER — HYDROMORPHONE HCL 2 MG PO TABS: 1.0000 mg | ORAL_TABLET | Freq: Four times a day (QID) | ORAL | Status: AC | PRN

## 2011-10-26 MED ORDER — ASPIRIN 81 MG PO CHEW
324.0000 mg | CHEWABLE_TABLET | Freq: Once | ORAL | Status: AC
  Administered 2011-10-26: 324 mg via ORAL

## 2011-10-26 MED ORDER — HYDROMORPHONE HCL 2 MG PO TABS
1.0000 mg | ORAL_TABLET | Freq: Once | ORAL | Status: AC
  Administered 2011-10-26: 1 mg via ORAL
  Filled 2011-10-26: qty 1

## 2011-10-26 MED ORDER — ASPIRIN 325 MG PO TABS: 325.0000 mg | ORAL_TABLET | ORAL | Status: DC

## 2011-10-26 MED ORDER — ASPIRIN 81 MG PO CHEW
CHEWABLE_TABLET | ORAL | Status: AC
  Filled 2011-10-26: qty 4

## 2011-10-26 NOTE — ED Notes (Signed)
Sharp stabbing chest pain that has gotten worse today and started about 2 days ago. Pt staes "it feels like there is a knife stuck on the left side of my chest, hurts worse with a deep breath and wraps around to my back. I thought it was a muscle" denies nausea.

## 2011-10-26 NOTE — ED Provider Notes (Signed)
History     CSN: [id number]  Arrival date & time 10/26/11  2154   First MD Initiated Contact with Patient 10/26/11 2222      Chief Complaint  Patient presents with  . Chest Pain    (Consider location/radiation/quality/duration/timing/severity/associated sxs/prior treatment) Patient is a 56 y.o. female presenting with chest pain. The history is provided by the patient.  Chest Pain The chest pain began 2 days ago. Chest pain occurs constantly. The chest pain is unchanged. The pain is associated with lifting (movement). At its most intense, the pain is at 8/10. The pain is currently at 8/10. The quality of the pain is described as sharp. Radiates to: wraps around left side. Chest pain is worsened by deep breathing (movement, turning the ehad). Pertinent negatives for primary symptoms include no fever, no syncope, no shortness of breath, no cough, no palpitations, no abdominal pain, no nausea and no vomiting.  Pertinent negatives for associated symptoms include no lower extremity edema, no near-syncope, no orthopnea and no paroxysmal nocturnal dyspnea. She tried nothing for the symptoms.  Her past medical history is significant for CAD (s/p CABG) and COPD.     Past Medical History  Diagnosis Date  . Coronary artery disease   . COPD (chronic obstructive pulmonary disease)     Past Surgical History  Procedure Date  . Open heart surgery   . Coronary artery bypass graft     History reviewed. No pertinent family history.  History  Substance Use Topics  . Smoking status: Former Smoker    Types: Cigarettes  . Smokeless tobacco: Not on file  . Alcohol Use: No    OB History    Grav Para Term Preterm Abortions TAB SAB Ect Mult Living                  Review of Systems  Constitutional: Negative for fever and chills.  HENT: Negative.   Eyes: Negative.   Respiratory: Negative for cough, chest tightness and shortness of breath.   Cardiovascular: Positive for chest pain. Negative  for palpitations, orthopnea, leg swelling, syncope and near-syncope.  Gastrointestinal: Negative.  Negative for nausea, vomiting, abdominal pain, diarrhea and constipation.  Genitourinary: Negative.   Musculoskeletal: Negative.   Skin: Negative.   Neurological: Negative.   All other systems reviewed and are negative.    Allergies  Codeine  Home Medications   Current Outpatient Rx  Name Route Sig Dispense Refill  . ALBUTEROL SULFATE 0.63 MG/3ML IN NEBU Nebulization Take 1 ampule by nebulization every 6 (six) hours as needed. For shortness of breath/wheezing    . ALBUTEROL SULFATE (2.5 MG/3ML) 0.083% IN NEBU Nebulization Take 2.5 mg by nebulization every 6 (six) hours as needed. For shortness of breath/wheezing    . BUDESONIDE 180 MCG/ACT IN AEPB Inhalation Inhale 1 puff into the lungs 2 (two) times daily.    . CHLORPHEN-PE-ACETAMINOPHEN 2-5-325 MG PO TABS Oral Take 2 tablets by mouth every 6 (six) hours as needed. For sinuses    . CITALOPRAM HYDROBROMIDE 20 MG PO TABS Oral Take 20 mg by mouth daily.    Marland Kitchen HYDROXYZINE HCL 25 MG PO TABS Oral Take 25 mg by mouth every 6 (six) hours as needed. For anxiety    . LEVOTHYROXINE SODIUM 125 MCG PO TABS Oral Take 125 mcg by mouth daily.    Marland Kitchen LORATADINE-PSEUDOEPHEDRINE ER 10-240 MG PO TB24 Oral Take 1 tablet by mouth daily.    Marland Kitchen METOPROLOL TARTRATE 25 MG PO TABS Oral Take 25  mg by mouth 2 (two) times daily.    Marland Kitchen TIOTROPIUM BROMIDE MONOHYDRATE 18 MCG IN CAPS Inhalation Place 18 mcg into inhaler and inhale daily.    Marland Kitchen HYDROMORPHONE HCL 2 MG PO TABS Oral Take 0.5 tablets (1 mg total) by mouth every 6 (six) hours as needed for pain. 10 tablet 0    BP 159/76  Pulse 76  Temp 98.3 F (36.8 C) (Oral)  Resp 13  SpO2 97%  Physical Exam  Nursing note and vitals reviewed. Constitutional: She is oriented to person, place, and time. She appears well-developed and well-nourished. No distress.  HENT:  Head: Normocephalic and atraumatic.  Eyes:  Conjunctivae are normal.  Neck: Neck supple.  Cardiovascular: Normal rate, regular rhythm, normal heart sounds and intact distal pulses.   Pulmonary/Chest: Effort normal and breath sounds normal. She has no wheezes. She has no rales. She exhibits no tenderness.  Abdominal: Soft. She exhibits no distension. There is no tenderness.  Musculoskeletal: Normal range of motion.  Neurological: She is alert and oriented to person, place, and time.  Skin: Skin is warm and dry.    ED Course  Procedures (including critical care time)  Labs Reviewed  CBC - Abnormal; Notable for the following:    WBC 15.0 (*)     All other components within normal limits  BASIC METABOLIC PANEL - Abnormal; Notable for the following:    GFR calc non Af Amer 68 (*)     GFR calc Af Amer 79 (*)     All other components within normal limits  POCT I-STAT TROPONIN I   Dg Chest 2 View  10/26/2011  *RADIOLOGY REPORT*  Clinical Data: Chest pain.  CHEST - 2 VIEW  Comparison: 12/14/2007.  Findings:  Cardiopericardial silhouette within normal limits. Mediastinal contours normal. Trachea midline.  No airspace disease or effusion.  CABG.  IMPRESSION: No active cardiopulmonary disease.  Original Report Authenticated By: Dereck Ligas, M.D.     1. Musculoskeletal chest pain       MDM  56 yo female with PMHx of CAD s/p CABG and COPD who presents with 2 day history of chest pain.  Pt reports onset of left sided chest pain after picking up a large dog to days ago.  Today she feels pain has worsened.  It is described as sharp pain wrapping around the left side of the chest.  Pain is worsened with movement of the arm and shoulder.  No history of shortness of breath, cough, or fever.  AF, VSS, NAD.  Presentation consistent with musculoskeletal chest pain.  Will follow labs and CXR obtained in triage.  EKG: rate 84, NSR, nml axis, nml intervals, no ST or T wave changes.  No prior EKG for comparison.  Labs wnl and troponin negative.   Description of pain exacerbated by turning the head and moving the arm with inciting event of lifting her 60 lb dog consistent with musculoskeletal chest pain.  Doubt ACS.  Pt does not tolerate codeine preparations well and will DC with short course of low-dose PO Dilaudid.  Tx plan discussed with pt who voiced understanding.  Strong return precautions provided.        Renaldo Reel, MD 10/27/11 [phone]

## 2011-10-26 NOTE — ED Provider Notes (Signed)
I saw and evaluated the patient, reviewed the resident's note and I agree with the findings and plan.  Patient seen by me. Left anterior chest pain made worse by moving Worse by moving left arm has been ongoing for 2 days. Got worse today started after lifting her 60 pound dog. Made also worse by coughing. Do not think this is cardiac chest pain seems very musculoskeletal. EKG without any acute changes. His labs showed no significant abnormalities patient can be discharged home she does not like taking codeine-type medicine so I would recommend hydromorphone by mouth.    Date: 10/26/2011  Rate: 84  Rhythm: normal sinus rhythm  QRS Axis: normal  Intervals: normal  ST/T Wave abnormalities: nonspecific T wave changes  Conduction Disutrbances:none  Narrative Interpretation:   Old EKG Reviewed: unchanged  From 06/14/2005  Mervin Kung, MD 10/26/11 [phone]

## 2011-10-27 NOTE — ED Notes (Signed)
MD at bedside. 

## 2011-10-27 NOTE — ED Notes (Signed)
Gave patient sprite. Tolerating well.

## 2012-08-06 DIAGNOSIS — I739 Peripheral vascular disease, unspecified: Secondary | ICD-10-CM | POA: Insufficient documentation

## 2012-08-06 DIAGNOSIS — I743 Embolism and thrombosis of arteries of the lower extremities: Secondary | ICD-10-CM | POA: Insufficient documentation

## 2014-11-19 DIAGNOSIS — J4540 Moderate persistent asthma, uncomplicated: Secondary | ICD-10-CM | POA: Insufficient documentation

## 2014-11-19 DIAGNOSIS — J449 Chronic obstructive pulmonary disease, unspecified: Secondary | ICD-10-CM | POA: Insufficient documentation

## 2014-11-19 DIAGNOSIS — J3089 Other allergic rhinitis: Secondary | ICD-10-CM | POA: Insufficient documentation

## 2014-12-14 ENCOUNTER — Other Ambulatory Visit: Payer: Self-pay | Admitting: Allergy and Immunology

## 2014-12-30 ENCOUNTER — Other Ambulatory Visit: Payer: Self-pay | Admitting: Allergy and Immunology

## 2015-01-11 ENCOUNTER — Ambulatory Visit (INDEPENDENT_AMBULATORY_CARE_PROVIDER_SITE_OTHER): Payer: Medicaid Other | Admitting: Allergy and Immunology

## 2015-01-11 ENCOUNTER — Encounter: Payer: Self-pay | Admitting: Allergy and Immunology

## 2015-01-11 VITALS — BP 136/70 | HR 74 | Resp 14 | Ht 59.21 in | Wt 152.1 lb

## 2015-01-11 DIAGNOSIS — J449 Chronic obstructive pulmonary disease, unspecified: Secondary | ICD-10-CM

## 2015-01-11 DIAGNOSIS — J4540 Moderate persistent asthma, uncomplicated: Secondary | ICD-10-CM | POA: Diagnosis not present

## 2015-01-11 DIAGNOSIS — J3089 Other allergic rhinitis: Secondary | ICD-10-CM

## 2015-01-11 MED ORDER — TIOTROPIUM BROMIDE MONOHYDRATE 18 MCG IN CAPS: 18.0000 ug | ORAL_CAPSULE | Freq: Every day | RESPIRATORY_TRACT | Status: DC

## 2015-01-11 NOTE — Patient Instructions (Addendum)
  1. Restart Spiriva handihaler one inhaled capsule contents one time per day.  2. Continue Symbicort 160 two inhalations two times per day  3. Add Qvar 80 two inhalations two times a day to Symbicort during 'flare up'  4. Continue Proair HFA and albuterol nebulization if needed.  5. Return in 6 months or earlier if problem

## 2015-01-11 NOTE — Progress Notes (Signed)
[facility]  Follow-up Note  Refering Provider: No ref. provider found Primary Provider: Default, Provider, MD  Subjective:   Bethany Sosa is a 59 y.o. female who returns to the [facility] [place] in re-evaluation of the following:  HPI Comments:  Bethany Sosa returns to this clinic noting that she is doing very well. However, she did run out of Spiriva in the past week or so and she is felt a little bit more short of breath and has had to use her bronchodilator more often. Prior to stopping her Spiriva and while consistently using her Symbicort without any Qvar she was doing quite well and had very little wheezing and coughing and shortness of breath and she can exert without much problem and rarely uses her bronchodilator. Her nose is been doing quite well.   Outpatient Encounter Prescriptions as of 01/11/2015  Medication Sig  . albuterol (PROAIR HFA) 108 (90 BASE) MCG/ACT inhaler Inhale 2 puffs into the lungs every 4 (four) hours as needed for wheezing or shortness of breath.  Marland Kitchen albuterol (PROVENTIL) (2.5 MG/3ML) 0.083% nebulizer solution Take 2.5 mg by nebulization every 4 (four) hours as needed for wheezing or shortness of breath.  Marland Kitchen aspirin 81 MG EC tablet Take 81 mg by mouth daily. Swallow whole.  . beclomethasone (QVAR) 80 MCG/ACT inhaler Inhale into the lungs as needed.  Marland Kitchen levothyroxine (SYNTHROID, LEVOTHROID) 125 MCG tablet Take 125 mcg by mouth daily.  . metoprolol tartrate (LOPRESSOR) 25 MG tablet Take 25 mg by mouth 2 (two) times daily.  . Simvastatin (ZOCOR PO) Take by mouth.  . SYMBICORT 160-4.5 MCG/ACT inhaler INHALE TWO PUFFS BY MOUTH EVERY 12 HOURS TO  PREVENT  COUGH  OR  WHEEZING.  RINSE,GARGLE,  AND  SPIT  AFTER  USE. NEEDS APPOINTMENT  . tiotropium (SPIRIVA HANDIHALER) 18 MCG inhalation capsule Place 18 mcg into inhaler and inhale daily.  . [DISCONTINUED] albuterol (ACCUNEB) 0.63 MG/3ML nebulizer solution  Take 1 ampule by nebulization every 6 (six) hours as needed. For shortness of breath/wheezing  . [DISCONTINUED] albuterol (PROVENTIL) (2.5 MG/3ML) 0.083% nebulizer solution Take 2.5 mg by nebulization every 6 (six) hours as needed. For shortness of breath/wheezing  . [DISCONTINUED] budesonide (PULMICORT) 180 MCG/ACT inhaler Inhale 1 puff into the lungs 2 (two) times daily.  . [DISCONTINUED] Chlorphen-Phenyleph-APAP (TYLENOL SINUS CONGESTION/PAIN) 2-5-325 MG TABS Take 2 tablets by mouth every 6 (six) hours as needed. For sinuses  . [DISCONTINUED] citalopram (CELEXA) 20 MG tablet Take 20 mg by mouth daily.  . [DISCONTINUED] hydrOXYzine (ATARAX/VISTARIL) 25 MG tablet Take 25 mg by mouth every 6 (six) hours as needed. For anxiety  . [DISCONTINUED] Levothyroxine Sodium (SYNTHROID PO) Take by mouth.  . [DISCONTINUED] loratadine-pseudoephedrine (CLARITIN-D 24-HOUR) 10-240 MG per 24 hr tablet Take 1 tablet by mouth daily.  . [DISCONTINUED] tiotropium (SPIRIVA) 18 MCG inhalation capsule Place 18 mcg into inhaler and inhale daily.   No facility-administered encounter medications on file as of 01/11/2015.    No orders of the defined types were placed in this encounter.    Past Medical History  Diagnosis Date  . Coronary artery disease   . COPD (chronic obstructive pulmonary disease) ([place])   . History of hysterectomy   . Hyperthyroidism   . Asthma   . Allergic rhinitis     Past Surgical History  Procedure Laterality Date  . Open heart surgery    . Coronary artery bypass graft  Allergies  Allergen Reactions  . Codeine     GI issues    Review of Systems  Constitutional: Negative for fever and chills.  HENT: Negative for congestion, ear pain, facial swelling, nosebleeds, postnasal drip, rhinorrhea, sinus pressure, sneezing, sore throat, tinnitus, trouble swallowing and voice change.   Eyes: Negative for pain, discharge, redness and itching.  Respiratory: Positive for cough and  wheezing. Negative for choking, chest tightness, shortness of breath and stridor.   Cardiovascular: Negative for chest pain and leg swelling.  Gastrointestinal: Negative for nausea, vomiting and abdominal pain.  Endocrine: Negative for cold intolerance and heat intolerance.  Genitourinary: Negative for difficulty urinating.  Musculoskeletal: Negative for myalgias and arthralgias.  Allergic/Immunologic: Negative.   Neurological: Negative for dizziness.  Hematological: Negative for adenopathy.     Objective:   Filed Vitals:   01/11/15 1139  BP: 136/70  Pulse: 74  Resp: 14   Height: 4' 11.21" (150.4 cm)  Weight: 152 lb 1.9 oz (69 kg)   Physical Exam  Constitutional: She appears well-developed and well-nourished. No distress.  HENT:  Head: Normocephalic and atraumatic. Head is without right periorbital erythema and without left periorbital erythema.  Right Ear: Tympanic membrane, external ear and ear canal normal. No drainage or tenderness. No foreign bodies. Tympanic membrane is not injected, not scarred, not perforated, not erythematous, not retracted and not bulging. No middle ear effusion.  Left Ear: Tympanic membrane, external ear and ear canal normal. No drainage or tenderness. No foreign bodies. Tympanic membrane is not injected, not scarred, not perforated, not erythematous, not retracted and not bulging.  No middle ear effusion.  Nose: Nose normal. No mucosal edema, rhinorrhea, nose lacerations or sinus tenderness.  No foreign bodies.  Mouth/Throat: Oropharynx is clear and moist. No oropharyngeal exudate, posterior oropharyngeal edema, posterior oropharyngeal erythema or tonsillar abscesses.  Eyes: Lids are normal. Right eye exhibits no chemosis, no discharge and no exudate. No foreign body present in the right eye. Left eye exhibits no chemosis, no discharge and no exudate. No foreign body present in the left eye. Right conjunctiva is not injected. Left conjunctiva is not  injected.  Neck: Neck supple. No tracheal tenderness present. No tracheal deviation and no edema present. No thyroid mass and no thyromegaly present.  Cardiovascular: Normal rate, regular rhythm, S1 normal and S2 normal.  Exam reveals no gallop.   No murmur heard. Pulmonary/Chest: No accessory muscle usage or stridor. No respiratory distress. She has no wheezes. She has no rhonchi. She has no rales.  Abdominal: Soft. There is no hepatosplenomegaly. There is no tenderness. There is no rigidity, no rebound and no guarding.  Musculoskeletal: She exhibits no edema.  Lymphadenopathy:       Head (right side): No tonsillar adenopathy present.       Head (left side): No tonsillar adenopathy present.    She has no cervical adenopathy.  Neurological: She is alert.  Skin: No rash noted. She is not diaphoretic.  Psychiatric: She has a normal mood and affect. Her behavior is normal.    Diagnostics:    Spirometry was performed and demonstrated an FEV1 of 1.25 at 60 % of predicted.  The patient had an Asthma Control Test with the following results:  .    Assessment and Plan:   1. Chronic obstructive pulmonary disease, unspecified COPD type ([place])   2. Moderate persistent asthma, uncomplicated   3. Other allergic rhinitis      1. Restart Spiriva handihaler one inhaled capsule contents one time  per day.  2. Continue Symbicort 160 two inhalations two times per day  3. Add Qvar 80 two inhalations two times a day to Symbicort during 'flare up'  4. Continue Proair HFA and albuterol nebulization if needed.  5. Return in 6 months or earlier if problem  Overall Bethany Sosa has done well and we will continue to have her use the a for mentioned therapy which is a combination of a inhaled steroid, long-acting bronchodilator, and long-acting anticholinergic agent. I will see her back in this clinic in 6 months or earlier if there is a problem.     Allena Katz, MD [place]

## 2015-03-17 ENCOUNTER — Other Ambulatory Visit: Payer: Self-pay | Admitting: *Deleted

## 2015-03-17 ENCOUNTER — Other Ambulatory Visit: Payer: Self-pay | Admitting: Allergy and Immunology

## 2015-03-17 MED ORDER — BECLOMETHASONE DIPROPIONATE 80 MCG/ACT IN AERS: 2.0000 | INHALATION_SPRAY | Freq: Two times a day (BID) | RESPIRATORY_TRACT | Status: DC

## 2015-03-17 MED ORDER — ALBUTEROL SULFATE HFA 108 (90 BASE) MCG/ACT IN AERS: 2.0000 | INHALATION_SPRAY | RESPIRATORY_TRACT | Status: DC | PRN

## 2015-03-17 NOTE — Telephone Encounter (Signed)
PT IS REQUESTING MEDICATION REFILL FOR EMERGENCY INHALER AND QVAR.

## 2015-03-23 ENCOUNTER — Other Ambulatory Visit: Payer: Self-pay

## 2015-03-23 MED ORDER — ALBUTEROL SULFATE HFA 108 (90 BASE) MCG/ACT IN AERS
2.0000 | INHALATION_SPRAY | RESPIRATORY_TRACT | Status: DC | PRN
Start: 2015-03-23 — End: 2016-02-10

## 2015-05-29 DIAGNOSIS — M25551 Pain in right hip: Secondary | ICD-10-CM | POA: Insufficient documentation

## 2015-05-29 DIAGNOSIS — I2510 Atherosclerotic heart disease of native coronary artery without angina pectoris: Secondary | ICD-10-CM | POA: Diagnosis present

## 2015-05-29 DIAGNOSIS — E039 Hypothyroidism, unspecified: Secondary | ICD-10-CM | POA: Diagnosis present

## 2015-05-29 DIAGNOSIS — E785 Hyperlipidemia, unspecified: Secondary | ICD-10-CM | POA: Insufficient documentation

## 2015-07-12 ENCOUNTER — Encounter: Payer: Self-pay | Admitting: Allergy and Immunology

## 2015-07-12 ENCOUNTER — Ambulatory Visit (INDEPENDENT_AMBULATORY_CARE_PROVIDER_SITE_OTHER): Payer: Medicaid Other | Admitting: Allergy and Immunology

## 2015-07-12 VITALS — BP 140/70 | HR 72 | Resp 14

## 2015-07-12 DIAGNOSIS — J310 Chronic rhinitis: Secondary | ICD-10-CM

## 2015-07-12 DIAGNOSIS — J449 Chronic obstructive pulmonary disease, unspecified: Secondary | ICD-10-CM

## 2015-07-12 DIAGNOSIS — J387 Other diseases of larynx: Secondary | ICD-10-CM

## 2015-07-12 DIAGNOSIS — T485X1A Poisoning by other anti-common-cold drugs, accidental (unintentional), initial encounter: Secondary | ICD-10-CM | POA: Diagnosis not present

## 2015-07-12 DIAGNOSIS — J45909 Unspecified asthma, uncomplicated: Secondary | ICD-10-CM | POA: Diagnosis not present

## 2015-07-12 DIAGNOSIS — K219 Gastro-esophageal reflux disease without esophagitis: Secondary | ICD-10-CM

## 2015-07-12 DIAGNOSIS — J3089 Other allergic rhinitis: Secondary | ICD-10-CM | POA: Diagnosis not present

## 2015-07-12 DIAGNOSIS — T485X5A Adverse effect of other anti-common-cold drugs, initial encounter: Secondary | ICD-10-CM

## 2015-07-12 MED ORDER — AZELASTINE-FLUTICASONE 137-50 MCG/ACT NA SUSP: NASAL | Status: DC

## 2015-07-12 MED ORDER — FLUTICASONE PROPIONATE 50 MCG/ACT NA SUSP: 1.0000 | Freq: Two times a day (BID) | NASAL | Status: DC

## 2015-07-12 MED ORDER — RANITIDINE HCL 300 MG PO TABS: ORAL_TABLET | ORAL | Status: DC

## 2015-07-12 MED ORDER — OMEPRAZOLE 40 MG PO CPDR: DELAYED_RELEASE_CAPSULE | ORAL | Status: DC

## 2015-07-12 MED ORDER — AZELASTINE HCL 0.1 % NA SOLN: 1.0000 | Freq: Two times a day (BID) | NASAL | Status: DC

## 2015-07-12 NOTE — Patient Instructions (Addendum)
  1. Continue Spiriva handihaler one inhaled capsule contents one time per day.  2. Continue Symbicort 160 two inhalations two times per day  3. Add Qvar 80 two inhalations two times a day to Symbicort during 'flare up'  4. Continue Proair HFA and albuterol nebulization if needed.  5. Discontinue over-the-counter nasal decongestant spray  6. Start reflux treatment:   A. decrease daily caffeine and chocolate consumption  B. start omeprazole 40 mg 1 time per day in morning  C. start ranitidine 300 mg 1 time per day in evening  7. Start Dymista one spray each nostril twice a day  8. Can use over-the-counter antihistamine if needed  9. Return in 4 weeks or earlier if problem

## 2015-07-12 NOTE — Progress Notes (Signed)
Follow-up Note  Referring Provider: No ref. provider found Primary Provider: Janie Morning, DO Date of Office Visit: 07/12/2015  Subjective:   Bethany Sosa (DOB: [birth year]) is a 60 y.o. female who returns to the [facility] [place] on 07/12/2015 in re-evaluation of the following:  HPI: Angellena returns to this clinic in reevaluation of her COPD with asthma, allergic rhinitis, and problems with drainage. I last saw her in his clinic approximately 6 months ago.  Her lower respiratory tract status has really been doing quite well. She has not had a flareup of her asthma requiring a systemic steroid. She rarely uses any short acting bronchodilator and can exert herself without any difficulty. Morrigan has been consistently using a combination of Symbicort and Spiriva and has not had to activate an action plan including addition of Qvar. She did obtain the flu vaccine.  Her nose is been a little bit stuffy but she's had no sneezing and no anosmia and no headaches and no ugly nasal discharge. She has had lots of postnasal drip and some throat clearing. She was given Flonase which does not help her. She is using some over-the-counter nasal decongestant spray twice a day for the past several months which has not helped her. She has a history of reflux but she does not think this is active at this point in time. She drinks 5 cups of coffee per day.    Medication List           albuterol (2.5 MG/3ML) 0.083% nebulizer solution  Commonly known as:  PROVENTIL  Take 2.5 mg by nebulization every 4 (four) hours as needed for wheezing or shortness of breath.     albuterol 108 (90 Base) MCG/ACT inhaler  Commonly known as:  PROAIR HFA  Inhale 2 puffs into the lungs every 4 (four) hours as needed for wheezing or shortness of breath.     aspirin 81 MG EC tablet  Take 81 mg by mouth daily. Swallow whole.     beclomethasone 80 MCG/ACT inhaler  Commonly known as:  QVAR  Inhale 2 puffs into  the lungs 2 (two) times daily.     levothyroxine 125 MCG tablet  Commonly known as:  SYNTHROID, LEVOTHROID  Take 125 mcg by mouth daily.     metoprolol tartrate 25 MG tablet  Commonly known as:  LOPRESSOR  Take 25 mg by mouth 2 (two) times daily.     SYMBICORT 160-4.5 MCG/ACT inhaler  Generic drug:  budesonide-formoterol  INHALE TWO PUFFS BY MOUTH EVERY 12 HOURS TO PREVENT COUGH OR WHEEZING. RINSE, GARGLE, AND SPIT AFTER USE. NEEDS APPOINTMENT.     tiotropium 18 MCG inhalation capsule  Commonly known as:  SPIRIVA HANDIHALER  Place 1 capsule (18 mcg total) into inhaler and inhale daily.     ZOCOR PO  Take by mouth.        Past Medical History  Diagnosis Date  . Coronary artery disease   . COPD (chronic obstructive pulmonary disease) ([place])   . History of hysterectomy   . Hyperthyroidism   . Asthma   . Allergic rhinitis     Past Surgical History  Procedure Laterality Date  . Open heart surgery    . Coronary artery bypass graft      Allergies  Allergen Reactions  . Codeine     GI issues    Review of systems negative except as noted in HPI / PMHx or noted below:  Review of Systems  Constitutional:  Negative.   HENT: Negative.   Eyes: Negative.   Respiratory: Negative.   Cardiovascular: Negative.   Gastrointestinal: Negative.   Genitourinary: Negative.   Musculoskeletal: Negative.   Skin: Negative.   Neurological: Negative.   Endo/Heme/Allergies: Negative.   Psychiatric/Behavioral: Negative.      Objective:   Filed Vitals:   07/12/15 0839  BP: 140/70  Pulse: 72  Resp: 14          Physical Exam  Constitutional: She is well-developed, well-nourished, and in no distress.  HENT:  Head: Normocephalic.  Right Ear: Tympanic membrane, external ear and ear canal normal.  Left Ear: Tympanic membrane, external ear and ear canal normal.  Nose: Nose normal. No mucosal edema or rhinorrhea.  Mouth/Throat: Uvula is midline, oropharynx is clear and moist and  mucous membranes are normal. No oropharyngeal exudate.  Eyes: Conjunctivae are normal.  Neck: Trachea normal. No tracheal tenderness present. No tracheal deviation present. No thyromegaly present.  Cardiovascular: Normal rate, regular rhythm, S1 normal, S2 normal and normal heart sounds.   No murmur heard. Pulmonary/Chest: Breath sounds normal. No stridor. No respiratory distress. She has no wheezes. She has no rales.  Musculoskeletal: She exhibits no edema.  Lymphadenopathy:       Head (right side): No tonsillar adenopathy present.       Head (left side): No tonsillar adenopathy present.    She has no cervical adenopathy.  Neurological: She is alert. Gait normal.  Skin: No rash noted. She is not diaphoretic. No erythema. Nails show no clubbing.  Psychiatric: Mood and affect normal.    Diagnostics:    Spirometry was performed and demonstrated an FEV1 of 1.43 at 69 % of predicted.  The patient had an Asthma Control Test with the following results: ACT Total Score: 22.    Assessment and Plan:   1. COPD with asthma ([place])   2. Other allergic rhinitis   3. LPRD (laryngopharyngeal reflux disease)   4. Rhinitis medicamentosa      1. Continue Spiriva handihaler one inhaled capsule contents one time per day.  2. Continue Symbicort 160 two inhalations two times per day  3. Add Qvar 80 two inhalations two times a day to Symbicort during 'flare up'  4. Continue Proair HFA and albuterol nebulization if needed.  5. Discontinue over-the-counter nasal decongestant spray  6. Start reflux treatment:   A. decrease daily caffeine and chocolate consumption  B. start omeprazole 40 mg 1 time per day in morning  C. start ranitidine 300 mg 1 time per day in evening  7. Start Dymista one spray each nostril twice a day  8. Can use over-the-counter antihistamine if needed  9. Return in 4 weeks or earlier if problem  I will have Roxine utilize a combination of therapy directed against reflux  assuming that she may have a component of LPR and also have her discontinue her over-the-counter nasal decongestant spray and give her Dymista to help with any upper airway inflammation. I'll regroup with her in approximately 4 weeks and we'll make a determination about how to proceed pending her response.  Allena Katz, MD [place]

## 2015-08-12 ENCOUNTER — Other Ambulatory Visit: Payer: Self-pay | Admitting: Allergy and Immunology

## 2015-08-12 ENCOUNTER — Ambulatory Visit (INDEPENDENT_AMBULATORY_CARE_PROVIDER_SITE_OTHER): Payer: Medicaid Other | Admitting: Allergy and Immunology

## 2015-08-12 ENCOUNTER — Encounter: Payer: Self-pay | Admitting: Allergy and Immunology

## 2015-08-12 VITALS — BP 140/80 | HR 78 | Resp 16

## 2015-08-12 DIAGNOSIS — J45909 Unspecified asthma, uncomplicated: Secondary | ICD-10-CM

## 2015-08-12 DIAGNOSIS — J387 Other diseases of larynx: Secondary | ICD-10-CM | POA: Diagnosis not present

## 2015-08-12 DIAGNOSIS — J449 Chronic obstructive pulmonary disease, unspecified: Secondary | ICD-10-CM

## 2015-08-12 DIAGNOSIS — K219 Gastro-esophageal reflux disease without esophagitis: Secondary | ICD-10-CM

## 2015-08-12 DIAGNOSIS — J3089 Other allergic rhinitis: Secondary | ICD-10-CM | POA: Diagnosis not present

## 2015-08-12 MED ORDER — BUDESONIDE-FORMOTEROL FUMARATE 160-4.5 MCG/ACT IN AERO: INHALATION_SPRAY | RESPIRATORY_TRACT | Status: DC

## 2015-08-12 NOTE — Patient Instructions (Addendum)
  1. Continue Spiriva handihaler one inhaled capsule contents one time per day.  2. Continue Symbicort 160 two inhalations two times per day  3. Add Qvar 80 two inhalations two times a day to Symbicort during 'flare up'  4. Continue Proair HFA and albuterol nebulization if needed.  5. Discontinue over-the-counter nasal decongestant spray  6. Continue reflux treatment:   A. decrease daily caffeine and chocolate consumption  B. omeprazole 40 mg 1 time per day in morning  C. ranitidine 300 mg 1 time per day in evening  7. Start OTC Rhinocort one spray each nostril 3-7 times per week. Coupon  8. Can use over-the-counter antihistamine if needed  9. Return in 8 weeks or earlier if problem

## 2015-08-12 NOTE — Progress Notes (Signed)
Follow-up Note  Referring Provider: Janie Morning, DO Primary Provider: Janie Morning, DO Date of Office Visit: 08/12/2015  Subjective:   Bethany Sosa (DOB: [birth year]) is a 60 y.o. 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 Utilizing the plan as established on 13 Jul 2015 she has done much better. She's had very little problems with her lower airway and does not have any coughing and does not use a bronchodilator and can exert herself without any problem. Her nose has improved although it is still a little bit stuffy and she's no longer using any nasal steroid because her insurance company will not pay for dymista. Her throat is improved tremendously and she is basically resolved her postnasal drip and throat clearing. She has consolidate her caffeine and she has been consistently using medical therapy prescribed on 07/13/2015.    Medication List           albuterol (2.5 MG/3ML) 0.083% nebulizer solution  Commonly known as:  PROVENTIL  Take 2.5 mg by nebulization every 4 (four) hours as needed for wheezing or shortness of breath.     albuterol 108 (90 Base) MCG/ACT inhaler  Commonly known as:  PROAIR HFA  Inhale 2 puffs into the lungs every 4 (four) hours as needed for wheezing or shortness of breath.     PROAIR HFA 108 (90 Base) MCG/ACT inhaler  Generic drug:  albuterol  INHALE TWO PUFFS BY MOUTH  INTO THE LUNGS EVERY 4 HOURS AS NEEDED FOR WHEEZING OR SHORTNESS OF BREATH     aspirin 81 MG EC tablet  Take 81 mg by mouth daily. Swallow whole.     azelastine 0.1 % nasal spray  Commonly known as:  ASTELIN  Place 1 spray into both nostrils 2 (two) times daily.     beclomethasone 80 MCG/ACT inhaler  Commonly known as:  QVAR  Inhale 2 puffs into the lungs 2 (two) times daily.     fluticasone 50 MCG/ACT nasal spray    Commonly known as:  FLONASE  Place 1 spray into both nostrils 2 (two) times daily.     levothyroxine 125 MCG tablet  Commonly known as:  SYNTHROID, LEVOTHROID  Take 125 mcg by mouth daily.     metoprolol tartrate 25 MG tablet  Commonly known as:  LOPRESSOR  Take 25 mg by mouth 2 (two) times daily.     omeprazole 40 MG capsule  Commonly known as:  PRILOSEC  TAKE ONE CAPSULE EACH MORNING BEFORE BREAKFAST     ranitidine 300 MG tablet  Commonly known as:  ZANTAC  TAKE ONE TABLET EACH EVENING     SPIRIVA HANDIHALER 18 MCG inhalation capsule  Generic drug:  tiotropium  INHALE ONE DOSE BY MOUTH ONCE DAILY     SYMBICORT 160-4.5 MCG/ACT inhaler  Generic drug:  budesonide-formoterol  INHALE TWO PUFFS BY MOUTH EVERY 12 HOURS TO PREVENT COUGH OR WHEEZING. RINSE, GARGLE, AND SPIT AFTER USE. NEEDS APPOINTMENT.     ZOCOR PO  Take by mouth.        Past Medical History  Diagnosis Date  . Coronary artery disease   . COPD (chronic obstructive pulmonary disease) ([place])   . History of hysterectomy   . Hyperthyroidism   . Asthma   . Allergic rhinitis     Past Surgical History  Procedure Laterality Date  .  Open heart surgery    . Coronary artery bypass graft      Allergies  Allergen Reactions  . Codeine     GI issues    Review of systems negative except as noted in HPI / PMHx or noted below:  Review of Systems  Constitutional: Negative.   HENT: Negative.   Eyes: Negative.   Respiratory: Negative.   Cardiovascular: Negative.   Gastrointestinal: Negative.   Genitourinary: Negative.   Musculoskeletal: Negative.   Skin: Negative.   Neurological: Negative.   Endo/Heme/Allergies: Negative.   Psychiatric/Behavioral: Negative.      Objective:   Filed Vitals:   08/12/15 1110  BP: 140/80  Pulse: 78  Resp: 16          Physical Exam  Constitutional: She is well-developed, well-nourished, and in no distress.  HENT:  Head: Normocephalic.  Right Ear: Tympanic  membrane, external ear and ear canal normal.  Left Ear: Tympanic membrane, external ear and ear canal normal.  Nose: Nose normal. No mucosal edema or rhinorrhea.  Mouth/Throat: Uvula is midline, oropharynx is clear and moist and mucous membranes are normal. No oropharyngeal exudate.  Eyes: Conjunctivae are normal.  Neck: Trachea normal. No tracheal tenderness present. No tracheal deviation present. No thyromegaly present.  Cardiovascular: Normal rate, regular rhythm, S1 normal, S2 normal and normal heart sounds.   No murmur heard. Pulmonary/Chest: Breath sounds normal. No stridor. No respiratory distress. She has no wheezes. She has no rales.  Musculoskeletal: She exhibits no edema.  Lymphadenopathy:       Head (right side): No tonsillar adenopathy present.       Head (left side): No tonsillar adenopathy present.    She has no cervical adenopathy.  Neurological: She is alert. Gait normal.  Skin: No rash noted. She is not diaphoretic. No erythema. Nails show no clubbing.  Psychiatric: Mood and affect normal.    Diagnostics:    Spirometry was performed and demonstrated an FEV1 of 1.19 at 57 % of predicted.  The patient had an Asthma Control Test with the following results: ACT Total Score: 23.    Assessment and Plan:   1. COPD with asthma ([place])   2. Other allergic rhinitis   3. LPRD (laryngopharyngeal reflux disease)     1. Continue Spiriva handihaler one inhaled capsule contents one time per day.  2. Continue Symbicort 160 two inhalations two times per day  3. Add Qvar 80 two inhalations two times a day to Symbicort during 'flare up'  4. Continue Proair HFA and albuterol nebulization if needed.  5. Discontinue over-the-counter nasal decongestant spray  6. Continue reflux treatment:   A. decrease daily caffeine and chocolate consumption  B. omeprazole 40 mg 1 time per day in morning  C. ranitidine 300 mg 1 time per day in evening  7. Start OTC Rhinocort one spray each  nostril 3-7 times per week. Coupon  8. Can use over-the-counter antihistamine if needed  9. Return in 8 weeks or earlier if problem  Sara has done better and I'm going to keep her on this plan for an additional 8 weeks which will be a total of 12 weeks of therapy. I would like for her to use some over-the-counter Rhinocort on a relatively consistent basis even if that means using it 3 times per week. She will contact me should she develop significant problems in the face of the therapy mentioned above. Otherwise, she will return to this clinic in 8 weeks  Allena Katz, MD [facility]

## 2015-10-07 ENCOUNTER — Ambulatory Visit: Payer: Medicaid Other | Admitting: Allergy and Immunology

## 2015-11-16 ENCOUNTER — Ambulatory Visit (INDEPENDENT_AMBULATORY_CARE_PROVIDER_SITE_OTHER): Payer: Medicaid Other | Admitting: Allergy

## 2015-11-16 ENCOUNTER — Encounter: Payer: Self-pay | Admitting: Allergy

## 2015-11-16 VITALS — BP 140/90 | HR 80 | Resp 20

## 2015-11-16 DIAGNOSIS — J441 Chronic obstructive pulmonary disease with (acute) exacerbation: Secondary | ICD-10-CM | POA: Diagnosis not present

## 2015-11-16 DIAGNOSIS — J45901 Unspecified asthma with (acute) exacerbation: Secondary | ICD-10-CM | POA: Diagnosis not present

## 2015-11-16 DIAGNOSIS — J3089 Other allergic rhinitis: Secondary | ICD-10-CM | POA: Diagnosis not present

## 2015-11-16 DIAGNOSIS — J387 Other diseases of larynx: Secondary | ICD-10-CM | POA: Diagnosis not present

## 2015-11-16 DIAGNOSIS — K219 Gastro-esophageal reflux disease without esophagitis: Secondary | ICD-10-CM

## 2015-11-16 MED ORDER — PREDNISONE 10 MG (21) PO TBPK: ORAL_TABLET | ORAL | 0 refills | Status: DC

## 2015-11-16 MED ORDER — IPRATROPIUM-ALBUTEROL 0.5-2.5 (3) MG/3ML IN SOLN: 3.0000 mL | Freq: Once | RESPIRATORY_TRACT | Status: DC

## 2015-11-16 NOTE — Patient Instructions (Signed)
Asthma with COPD overlap, exacerbation  - Continue Spiriva HandiHaler 1 capsule inhalation daily  - Continue Symbicort 162 puffs twice a day  - Continue Qvar 80 2 puffs twice a day during flare up  - Take Proair or albuterol nebulizer every 4 hours during the day for the next 2-3 days then decrease to every 6 hours for 2 days then every 8 hours for 2 days then resume as needed use  - We'll extend your prednisone course -- take 40 mg 2 days, then 30 mg 2 days then 20 mg 2 days and 10 mg 2 days.  - Complete your levofloxacin course  - Continue Mucinex as previously prescribed, drink plenty of water  Allergic rhinitis  - Take Rhinocort 1 spray each nostril daily - Take over-the-counter antihistamine (Zyrtec or Allegra) daily  Reflux  - Continue current reflux regimen with omeprazole once a day in the morning and ranitidine in the evening  Follow-up 2-3 weeks

## 2015-11-16 NOTE — Progress Notes (Signed)
Follow-up Note  RE: Bethany Sosa MRN: [id number] DOB: October 30, 1955 Date of Office Visit: 11/16/2015   History of present illness: Bethany Sosa is a 60 y.o. female presenting today for sick visit. She has COPD with asthma as well as allergic rhinitis and laryngopharyngeal reflux disease. She was last seen in our office by Dr. Neldon Mc in June 2017.  She reports she was doing well up until Saturday when she noticed she was a little wheezy.  Sunday she reports her symptoms worsened.  She states she feels like her chest is full, continued wheezing, some coughing.  Has been using albuterol twice a day and does report some relief however it is short-lived.  She reports a low grade fever Sunday but hasn't had one since that time. She denies any sick contacts. She is unsure of the trigger.   She went to urgent care on Sunday who prescribed prednisone 40mg  x 3 days, levofloxacin 750mg  x10 day and mucinex Dm.  She reports she does not feel that much better. She continues to take her Symbicort 160 g 2 puffs twice a day, Spiriva 1 inhalation once a day, and she is started Qvar 80 g 2 puffs twice a day with this flare.      Review of systems: Review of Systems  Constitutional: Positive for fever and malaise/fatigue. Negative for chills.  HENT: Negative for sore throat.   Eyes: Negative for redness.  Respiratory: Positive for cough, shortness of breath and wheezing.   Cardiovascular: Positive for chest pain.  Gastrointestinal: Positive for heartburn. Negative for nausea and vomiting.  Skin: Negative for rash.  Neurological: Negative for headaches.    All other systems negative unless noted above in HPI  Past medical/social/surgical/family history have been reviewed and are unchanged unless specifically indicated below.  No changes  Medication List:   Medication List       Accurate as of 11/16/15 11:59 AM. Always use your most recent med list.          albuterol (2.5 MG/3ML) 0.083%  nebulizer solution Commonly known as:  PROVENTIL Take 2.5 mg by nebulization every 4 (four) hours as needed for wheezing or shortness of breath.   albuterol 108 (90 Base) MCG/ACT inhaler Commonly known as:  PROAIR HFA Inhale 2 puffs into the lungs every 4 (four) hours as needed for wheezing or shortness of breath.   PROAIR HFA 108 (90 Base) MCG/ACT inhaler Generic drug:  albuterol INHALE TWO PUFFS BY MOUTH  INTO THE LUNGS EVERY 4 HOURS AS NEEDED FOR WHEEZING OR SHORTNESS OF BREATH   aspirin 81 MG EC tablet Take 81 mg by mouth daily. Swallow whole.   azelastine 0.1 % nasal spray Commonly known as:  ASTELIN Place 1 spray into both nostrils 2 (two) times daily.   beclomethasone 80 MCG/ACT inhaler Commonly known as:  QVAR Inhale 2 puffs into the lungs 2 (two) times daily.   budesonide-formoterol 160-4.5 MCG/ACT inhaler Commonly known as:  SYMBICORT Inhale two puffs twice daily to prevent cough or wheeze.  Rinse, gargle, and spit after use.   dextromethorphan-guaiFENesin 30-600 MG 12hr tablet Commonly known as:  MUCINEX DM Take 1 tablet by mouth 2 (two) times daily as needed for cough.   fluticasone 50 MCG/ACT nasal spray Commonly known as:  FLONASE Place 1 spray into both nostrils 2 (two) times daily.   levofloxacin 750 MG tablet Commonly known as:  LEVAQUIN Take 750 mg by mouth daily.   levothyroxine 125 MCG tablet Commonly known  as:  SYNTHROID, LEVOTHROID Take 125 mcg by mouth daily.   metoprolol tartrate 25 MG tablet Commonly known as:  LOPRESSOR Take 25 mg by mouth 2 (two) times daily.   omeprazole 40 MG capsule Commonly known as:  PRILOSEC TAKE ONE CAPSULE EACH MORNING BEFORE BREAKFAST   predniSONE 20 MG tablet Commonly known as:  DELTASONE Take 20 mg by mouth 2 (two) times daily with a meal.   predniSONE 10 MG (21) Tbpk tablet Commonly known as:  STERAPRED UNI-PAK 21 TAB Take 40 mg (4 tabs) x 2 days, then 30 mg (3 tabs) x 2 days, then 20 mg (2 tabs) x 2 days  then 10 mg (1 tab)x 2 days then stop   ranitidine 300 MG tablet Commonly known as:  ZANTAC TAKE ONE TABLET EACH EVENING   RHINOCORT ALLERGY 32 MCG/ACT nasal spray Generic drug:  budesonide Place 2 sprays into both nostrils daily.   SPIRIVA HANDIHALER 18 MCG inhalation capsule Generic drug:  tiotropium INHALE ONE DOSE BY MOUTH ONCE DAILY   ZOCOR PO Take by mouth.       Known medication allergies: Allergies  Allergen Reactions  . Codeine     GI issues     Physical examination: Blood pressure 140/90, pulse 80, resp. rate 20, SpO2 90 % increased to 94% following bronchodilator.  General: Alert, interactive, in no acute distress. HEENT: TMs pearly gray, turbinates mildly edematous without discharge, post-pharynx non erythematous. Neck: Supple without lymphadenopathy. Lungs: Decreased breath sounds with expiratory wheezing bilaterally. {no increased work of breathing.  After DuoNeb she has increased breath sounds bilaterally still with wheezing. CV: Normal S1, S2 without murmurs. Abdomen: Nondistended, nontender. Skin: Warm and dry, without lesions or rashes. Extremities:  No clubbing, cyanosis or edema. Neuro:   Grossly intact.  Diagnositics/Labs:  Spirometry: FEV1: 0.72L  35%, FVC: 1.10L 41%.  DuoNeb provided in office and she did not have a significant response. FVC did increase to 1.15 L.  Assessment and plan:   Asthma with COPD overlap, exacerbation  - Continue Spiriva HandiHaler 1 capsule inhalation daily  - Continue Symbicort 160 2 puffs twice a day  - Continue Qvar 80 2 puffs twice a day during flare up  - Take Proair or albuterol nebulizer every 4 hours during the day for the next 2-3 days then decrease to every 6 hours for 2 days then every 8 hours for 2 days then resume as needed use  - We'll extend your prednisone course -- take 40 mg 2 days, then 30 mg 2 days then 20 mg 2 days and 10 mg 2 days.  - Complete your levofloxacin course  - Continue Mucinex  as previously prescribed, drink plenty of water  Allergic rhinitis  - Take Rhinocort 1 spray each nostril daily - Take over-the-counter antihistamine (Zyrtec or Allegra) daily  Reflux  - Continue current reflux regimen with omeprazole once a day in the morning and ranitidine in the evening  Follow-up 2-3 weeks  I appreciate the opportunity to take part in Bethany Sosa's care. Please do not hesitate to contact me with questions.  Sincerely,   Prudy Feeler, MD Allergy/Immunology [facility] [place] [facility]

## 2015-12-07 ENCOUNTER — Encounter: Payer: Self-pay | Admitting: Allergy

## 2015-12-07 ENCOUNTER — Ambulatory Visit (INDEPENDENT_AMBULATORY_CARE_PROVIDER_SITE_OTHER): Payer: Medicaid Other | Admitting: Allergy

## 2015-12-07 VITALS — BP 150/82 | HR 88 | Resp 18

## 2015-12-07 DIAGNOSIS — M546 Pain in thoracic spine: Secondary | ICD-10-CM

## 2015-12-07 DIAGNOSIS — J45909 Unspecified asthma, uncomplicated: Secondary | ICD-10-CM | POA: Diagnosis not present

## 2015-12-07 DIAGNOSIS — K219 Gastro-esophageal reflux disease without esophagitis: Secondary | ICD-10-CM | POA: Diagnosis not present

## 2015-12-07 DIAGNOSIS — B370 Candidal stomatitis: Secondary | ICD-10-CM

## 2015-12-07 DIAGNOSIS — J449 Chronic obstructive pulmonary disease, unspecified: Secondary | ICD-10-CM

## 2015-12-07 DIAGNOSIS — J3089 Other allergic rhinitis: Secondary | ICD-10-CM | POA: Diagnosis not present

## 2015-12-07 NOTE — Patient Instructions (Addendum)
Asthma with COPD overlap, with recent exacerbation  - Continue Spiriva HandiHaler 1 capsule inhalation daily  - Continue Symbicort 162 puffs twice a day  - Continue Qvar 80 2 puffs twice a day during flare up  - Take Proair or albuterol nebulizer as needed  - Continue Mucinex with plenty of water while still with cough  Compression fracture  - unable to fully cough due to back pain  - recommend addressing pain control with your primary care provider  Thrush  - recommend treatment with Nystatin suspension for next 7 days  Allergic rhinitis  - Take Rhinocort 1 spray each nostril daily  - Take over-the-counter antihistamine (Zyrtec or Allegra) daily  Reflux  - Continue current reflux regimen with omeprazole once a day in the morning and ranitidine in the evening  Follow-up 2-3 months

## 2015-12-07 NOTE — Progress Notes (Signed)
Follow-up Note  RE: Bethany Sosa MRN: [id number] DOB: Jul 25, 1955 Date of Office Visit: 12/07/2015   History of present illness: Bethany Sosa is a 60 y.o. female presenting today for follow-up of asthma exacerbation. She was last seen in our office on September 5 by myself. At that time she had an exacerbation and she was started on Spiriva and provided with a prednisone course. She did complete her levofloxacin course that was previously prescribed before her appointment here. She had resume use of her Qvar 80 that she takes during flares. She continue on her Symbicort that she does 2 puffs twice a day every day.   She was seen in her PCP on September 12 due to continued symptoms. She was given Rocephin injection as well as a Kenalog injection. She was prescribed azithromycin course and started on Singulair. She also had a chest x-ray (see below).    She does report feeling much better now. She states that she does not have much shortness of breath and does not need to use her albuterol every day. She still has a bit of a cough however she is unable to cough as well as she will like due to a compression fracture. Because of the cough she continues to take Mucinex.  She started having back pain and had an x-ray that showed a compression fracture in her upper back about a week ago. She has follow up with her PCP later in the week. She has been taking Bayer back pain relief which helps some.  Review of systems: Review of Systems  Constitutional: Negative for fever.  HENT: Positive for congestion. Negative for sore throat.   Eyes: Negative for redness.  Respiratory: Positive for cough, sputum production, shortness of breath and wheezing.   Cardiovascular: Negative for chest pain.  Gastrointestinal: Negative for nausea and vomiting.  Musculoskeletal: Positive for back pain and neck pain.  Skin: Negative for rash.  Neurological: Negative for headaches.    All other systems negative  unless noted above in HPI  Past medical/social/surgical/family history have been reviewed and are unchanged unless specifically indicated below.  none  Medication List:   Medication List       Accurate as of 12/07/15 11:44 AM. Always use your most recent med list.          albuterol (2.5 MG/3ML) 0.083% nebulizer solution Commonly known as:  PROVENTIL Take 2.5 mg by nebulization every 4 (four) hours as needed for wheezing or shortness of breath.   albuterol 108 (90 Base) MCG/ACT inhaler Commonly known as:  PROAIR HFA Inhale 2 puffs into the lungs every 4 (four) hours as needed for wheezing or shortness of breath.   aspirin 81 MG EC tablet Take 81 mg by mouth daily. Swallow whole.   azelastine 0.1 % nasal spray Commonly known as:  ASTELIN Place 1 spray into both nostrils 2 (two) times daily.   beclomethasone 80 MCG/ACT inhaler Commonly known as:  QVAR Inhale 2 puffs into the lungs 2 (two) times daily.   budesonide-formoterol 160-4.5 MCG/ACT inhaler Commonly known as:  SYMBICORT Inhale two puffs twice daily to prevent cough or wheeze.  Rinse, gargle, and spit after use.   dextromethorphan-guaiFENesin 30-600 MG 12hr tablet Commonly known as:  MUCINEX DM Take 1 tablet by mouth 2 (two) times daily as needed for cough.   levothyroxine 125 MCG tablet Commonly known as:  SYNTHROID, LEVOTHROID Take 125 mcg by mouth daily.   metoprolol tartrate 25 MG tablet Commonly known  as:  LOPRESSOR Take 25 mg by mouth 2 (two) times daily.   montelukast 10 MG tablet Commonly known as:  SINGULAIR Take 10 mg by mouth.   omeprazole 40 MG capsule Commonly known as:  PRILOSEC TAKE ONE CAPSULE EACH MORNING BEFORE BREAKFAST   ranitidine 300 MG tablet Commonly known as:  ZANTAC TAKE ONE TABLET EACH EVENING   RHINOCORT ALLERGY 32 MCG/ACT nasal spray Generic drug:  budesonide Place 2 sprays into both nostrils daily.   SPIRIVA HANDIHALER 18 MCG inhalation capsule Generic drug:   tiotropium INHALE ONE DOSE BY MOUTH ONCE DAILY   ZOCOR PO Take by mouth.       Known medication allergies: Allergies  Allergen Reactions  . Codeine     GI issues     Physical examination: Blood pressure (!) 150/82, pulse 88, resp. rate 18.  General: Alert, interactive, in no acute distress. HEENT: TMs pearly gray, turbinates minimally edematous without discharge, post-pharynx non erythematous.  White plaques on her tongue and hard and soft palate  Neck: Supple without lymphadenopathy. Lungs: Mildly decreased breath sounds bilaterally without wheezing, rhonchi or rales. {no increased work of breathing. CV: Normal S1, S2 without murmurs. Abdomen: Nondistended, nontender. Skin: Warm and dry, without lesions or rashes. Extremities:  No clubbing, cyanosis or edema. Neuro:   Grossly intact.  Diagnositics/Labs: Spirometry: FEV1: 1.17L  57%, FVC: 1.64L  61%.  spirometry improved from last study..  Reviewed x-ray reading from September 12:  Stable cardiomediastinal silhouette. Status post CABG. No acute infiltrate or pleural effusion. No pulmonary edema. Stable mild perihilar bronchitic changes. Thoracic spine osteopenia. Mild compression deformities midthoracic spine of indeterminate age.  Assessment and plan:   Asthma with COPD overlap, with recent exacerbation - improved  - Continue Spiriva HandiHaler 1 capsule inhalation daily  - Continue Symbicort 160 2 puffs twice a day  - Continue Qvar 80 2 puffs twice a day while still with cough  - Continue Singulair daily  - Take Proair or albuterol nebulizer as needed  - Continue Mucinex with plenty of water while still with cough  Osteopenia\compression deformities  - unable to fully cough due to back pain  - recommend addressing pain control with your primary care provider  - ?need for repeat DEXA scan +/- treatment/therapy for osteopenia  Thrush  - recommend treatment with Nystatin suspension for next 7 days  - Recommend to  rinse and spit after inhaled steroid use  Allergic rhinitis  - Take Rhinocort 1 spray each nostril daily  - Take over-the-counter antihistamine (Zyrtec or Allegra) daily  Reflux  - Continue current reflux regimen with omeprazole once a day in the morning and ranitidine in the evening   follow-up 2-3 months   I appreciate the opportunity to take part in Bethany Sosa's care. Please do not hesitate to contact me with questions.    Sincerely,   Prudy Feeler, MD Allergy/Immunology [facility] [place] [facility] [place]

## 2015-12-16 ENCOUNTER — Other Ambulatory Visit: Payer: Self-pay | Admitting: Allergy and Immunology

## 2016-01-10 ENCOUNTER — Other Ambulatory Visit: Payer: Self-pay | Admitting: Allergy and Immunology

## 2016-01-31 ENCOUNTER — Other Ambulatory Visit: Payer: Self-pay | Admitting: Allergy and Immunology

## 2016-02-04 ENCOUNTER — Other Ambulatory Visit: Payer: Self-pay | Admitting: Allergy and Immunology

## 2016-02-10 ENCOUNTER — Other Ambulatory Visit: Payer: Self-pay

## 2016-02-10 MED ORDER — ALBUTEROL SULFATE HFA 108 (90 BASE) MCG/ACT IN AERS: 2.0000 | INHALATION_SPRAY | RESPIRATORY_TRACT | 0 refills | Status: DC | PRN

## 2016-02-23 ENCOUNTER — Other Ambulatory Visit: Payer: Self-pay | Admitting: Allergy and Immunology

## 2016-03-15 ENCOUNTER — Encounter: Payer: Self-pay | Admitting: Allergy

## 2016-03-15 ENCOUNTER — Ambulatory Visit (INDEPENDENT_AMBULATORY_CARE_PROVIDER_SITE_OTHER): Payer: Medicaid Other | Admitting: Allergy

## 2016-03-15 VITALS — BP 130/78 | HR 100 | Resp 18

## 2016-03-15 DIAGNOSIS — J3089 Other allergic rhinitis: Secondary | ICD-10-CM

## 2016-03-15 DIAGNOSIS — J4541 Moderate persistent asthma with (acute) exacerbation: Secondary | ICD-10-CM | POA: Diagnosis not present

## 2016-03-15 DIAGNOSIS — J449 Chronic obstructive pulmonary disease, unspecified: Secondary | ICD-10-CM

## 2016-03-15 DIAGNOSIS — J4489 Other specified chronic obstructive pulmonary disease: Secondary | ICD-10-CM

## 2016-03-15 NOTE — Patient Instructions (Signed)
Asthma with COPD overlap, with recent exacerbation  - Continue Spiriva HandiHaler 1 capsule inhalation daily  - Continue Symbicort 162 puffs twice a day  - Continue Qvar 80 2 puffs twice a day during flare up  - Take Proair or albuterol nebulizer as needed  - take prednisone 40mg  x 4 days, 30mg  x 3 days, 20mg  x 2 days, 10mg  x 1 day then stop  Allergic rhinitis  - continue Rhinocort 1-2 spray each nostril daily  - Take over-the-counter antihistamine (Zyrtec or Allegra) daily  Reflux  - Continue current reflux regimen with omeprazole once a day in the morning and ranitidine in the evening  Follow-up 2-3 months

## 2016-03-15 NOTE — Progress Notes (Signed)
Follow-up Note  RE: Bethany Sosa MRN: [id number] DOB: March 19, 1955 Date of Office Visit: 03/15/2016   History of present illness: Bethany Sosa is a 61 y.o. female presenting today for sick visit.  She was last seen in the office on 12/07/15.  She was doing well up until about a 1-2 weeks ago.  She reports she had her house worked on the week before christmas and she reports her house was "full of dust". She started having worsening of asthma her symptoms with productive cough, wheeze and worsening nasal congestion.  She saw her PCP about a week ago with these symptoms her gave her a prednisone for 7 day taper.   She is now down to10mg  and symptoms have started to worsen again.  She also got a azithromycin that she reports taking for a week. Using nebulizer before bed due to coughing right now.    She ran out of her Proair.       Review of systems: Review of Systems  Constitutional: Negative for chills, fever and malaise/fatigue.  HENT: Positive for congestion. Negative for nosebleeds, sinus pain, sore throat and tinnitus.   Eyes: Negative for discharge and redness.  Respiratory: Positive for cough, sputum production, shortness of breath and wheezing.   Cardiovascular: Negative for chest pain.  Gastrointestinal: Negative for heartburn, nausea and vomiting.  Skin: Negative for itching and rash.    All other systems negative unless noted above in HPI  Past medical/social/surgical/family history have been reviewed and are unchanged unless specifically indicated below.  No changes  Medication List: Allergies as of 03/15/2016      Reactions   Codeine    GI issues      Medication List       Accurate as of 03/15/16  1:47 PM. Always use your most recent med list.          albuterol (2.5 MG/3ML) 0.083% nebulizer solution Commonly known as:  PROVENTIL Take 2.5 mg by nebulization every 4 (four) hours as needed for wheezing or shortness of breath.   albuterol 108 (90 Base)  MCG/ACT inhaler Commonly known as:  PROAIR HFA Inhale 2 puffs into the lungs every 4 (four) hours as needed for wheezing or shortness of breath.   aspirin 81 MG EC tablet Take 81 mg by mouth daily. Swallow whole.   azelastine 0.1 % nasal spray Commonly known as:  ASTELIN Place 1 spray into both nostrils 2 (two) times daily.   azithromycin 500 MG tablet Commonly known as:  ZITHROMAX Take 500 mg by mouth.   dextromethorphan-guaiFENesin 30-600 MG 12hr tablet Commonly known as:  MUCINEX DM Take 1 tablet by mouth 2 (two) times daily as needed for cough.   levothyroxine 125 MCG tablet Commonly known as:  SYNTHROID, LEVOTHROID Take 125 mcg by mouth daily.   metoprolol tartrate 25 MG tablet Commonly known as:  LOPRESSOR Take 25 mg by mouth 2 (two) times daily.   montelukast 10 MG tablet Commonly known as:  SINGULAIR Take 10 mg by mouth.   omeprazole 40 MG capsule Commonly known as:  PRILOSEC TAKE ONE CAPSULE BY MOUTH IN THE MORNING BEFORE  BREAKFAST   predniSONE 20 MG tablet Commonly known as:  DELTASONE Take 3 tablets daily for 3 days then take 2 tablets daily for 3 days then take 1 tablet daily for 3 days   QVAR 80 MCG/ACT inhaler Generic drug:  beclomethasone INHALE TWO PUFFS INTO THE LUNGS TWICE DAILY.   ranitidine 300 MG tablet  Commonly known as:  ZANTAC TAKE ONE TABLET BY MOUTH IN THE EVENING   SYMBICORT 160-4.5 MCG/ACT inhaler Generic drug:  budesonide-formoterol INHALE TWO PUFFS BY MOUTH TWICE DAILY TO  PREVENT  COUGH  OR  WHEEZE.  RINSE,  GARGLE,  AND  SPIT  AFTER  USE.   tiotropium 18 MCG inhalation capsule Commonly known as:  SPIRIVA HANDIHALER Inhale the contents of one capsule once daily as directed.   ZOCOR PO Take by mouth.       Known medication allergies: Allergies  Allergen Reactions  . Codeine     GI issues     Physical examination: Blood pressure 130/78, pulse 100, resp. rate 18.  General: Alert, interactive, in no acute  distress. HEENT: TMs pearly gray, turbinates moderately edematous with clear discharge, post-pharynx non erythematous. Neck: Supple without lymphadenopathy. Lungs: Mildly decreased breath sounds with expiratory wheezing bilaterally. {no increased work of breathing.  Post duoneb better aeration with decreased wheezing throughout CV: Normal S1, S2 without murmurs. Abdomen: Nondistended, nontender. Skin: Warm and dry, without lesions or rashes. Extremities:  No clubbing, cyanosis or edema. Neuro:   Grossly intact.  Diagnositics/Labs:  Spirometry: FEV1: 1.04L  51%, FVC: 1.6L  60%,  Post BD she had improvement in FEV1 1.13L  Assessment and plan:   Asthma with COPD overlap, with acute exacerbation following particle dust exposure in home  - Continue Spiriva HandiHaler 1 capsule inhalation daily  - Continue Symbicort 160  2 puffs twice a day  - Continue Qvar 80  2 puffs twice a day during flare up  - Take Proair or albuterol nebulizer as needed  - take prednisone 40mg  x 4 days, 30mg  x 3 days, 20mg  x 2 days, 10mg  x 1 day then stop  Allergic rhinitis  - continue Rhinocort 1-2 spray each nostril daily  - Take over-the-counter antihistamine (Zyrtec or Allegra) daily  - encouraged nasal saline rinse however she prefers not to use  Reflux  - Continue current reflux regimen with omeprazole once a day in the morning and ranitidine in the evening  Follow-up 2-3 months  I appreciate the opportunity to take part in Jannessa's care. Please do not hesitate to contact me with questions.  Sincerely,   Prudy Feeler, MD Allergy/Immunology [facility]

## 2016-04-07 ENCOUNTER — Other Ambulatory Visit: Payer: Self-pay | Admitting: *Deleted

## 2016-04-07 MED ORDER — ALBUTEROL SULFATE HFA 108 (90 BASE) MCG/ACT IN AERS: 2.0000 | INHALATION_SPRAY | RESPIRATORY_TRACT | 1 refills | Status: DC | PRN

## 2016-04-23 DIAGNOSIS — I639 Cerebral infarction, unspecified: Secondary | ICD-10-CM

## 2016-04-23 DIAGNOSIS — E039 Hypothyroidism, unspecified: Secondary | ICD-10-CM | POA: Diagnosis not present

## 2016-04-23 DIAGNOSIS — Z720 Tobacco use: Secondary | ICD-10-CM | POA: Diagnosis not present

## 2016-04-23 DIAGNOSIS — E785 Hyperlipidemia, unspecified: Secondary | ICD-10-CM | POA: Diagnosis not present

## 2016-04-23 DIAGNOSIS — I10 Essential (primary) hypertension: Secondary | ICD-10-CM | POA: Diagnosis not present

## 2016-04-23 DIAGNOSIS — I2510 Atherosclerotic heart disease of native coronary artery without angina pectoris: Secondary | ICD-10-CM | POA: Diagnosis not present

## 2016-04-23 DIAGNOSIS — J449 Chronic obstructive pulmonary disease, unspecified: Secondary | ICD-10-CM | POA: Diagnosis not present

## 2016-04-23 HISTORY — DX: Cerebral infarction, unspecified: I63.9

## 2016-04-24 DIAGNOSIS — I639 Cerebral infarction, unspecified: Secondary | ICD-10-CM | POA: Diagnosis not present

## 2016-04-24 DIAGNOSIS — Z720 Tobacco use: Secondary | ICD-10-CM | POA: Diagnosis not present

## 2016-04-24 DIAGNOSIS — E785 Hyperlipidemia, unspecified: Secondary | ICD-10-CM | POA: Diagnosis not present

## 2016-04-24 DIAGNOSIS — I10 Essential (primary) hypertension: Secondary | ICD-10-CM | POA: Diagnosis not present

## 2016-04-25 DIAGNOSIS — I639 Cerebral infarction, unspecified: Secondary | ICD-10-CM | POA: Diagnosis not present

## 2016-04-25 DIAGNOSIS — E785 Hyperlipidemia, unspecified: Secondary | ICD-10-CM | POA: Diagnosis not present

## 2016-04-25 DIAGNOSIS — I10 Essential (primary) hypertension: Secondary | ICD-10-CM | POA: Diagnosis not present

## 2016-04-25 DIAGNOSIS — Z720 Tobacco use: Secondary | ICD-10-CM | POA: Diagnosis not present

## 2016-04-28 DIAGNOSIS — I69398 Other sequelae of cerebral infarction: Secondary | ICD-10-CM | POA: Insufficient documentation

## 2016-04-28 DIAGNOSIS — IMO0002 Reserved for concepts with insufficient information to code with codable children: Secondary | ICD-10-CM | POA: Insufficient documentation

## 2016-04-28 DIAGNOSIS — I639 Cerebral infarction, unspecified: Secondary | ICD-10-CM | POA: Insufficient documentation

## 2016-05-02 DIAGNOSIS — N289 Disorder of kidney and ureter, unspecified: Secondary | ICD-10-CM | POA: Diagnosis not present

## 2016-05-02 DIAGNOSIS — E860 Dehydration: Secondary | ICD-10-CM | POA: Diagnosis not present

## 2016-05-02 DIAGNOSIS — R262 Difficulty in walking, not elsewhere classified: Secondary | ICD-10-CM | POA: Diagnosis not present

## 2016-05-02 DIAGNOSIS — I639 Cerebral infarction, unspecified: Secondary | ICD-10-CM | POA: Diagnosis not present

## 2016-05-03 DIAGNOSIS — N289 Disorder of kidney and ureter, unspecified: Secondary | ICD-10-CM | POA: Diagnosis not present

## 2016-05-03 DIAGNOSIS — E860 Dehydration: Secondary | ICD-10-CM | POA: Diagnosis not present

## 2016-05-03 DIAGNOSIS — I639 Cerebral infarction, unspecified: Secondary | ICD-10-CM | POA: Diagnosis not present

## 2016-05-03 DIAGNOSIS — R262 Difficulty in walking, not elsewhere classified: Secondary | ICD-10-CM | POA: Diagnosis not present

## 2016-06-12 ENCOUNTER — Other Ambulatory Visit: Payer: Self-pay | Admitting: *Deleted

## 2016-06-12 MED ORDER — BUDESONIDE-FORMOTEROL FUMARATE 160-4.5 MCG/ACT IN AERO: INHALATION_SPRAY | RESPIRATORY_TRACT | 0 refills | Status: DC

## 2016-06-23 ENCOUNTER — Inpatient Hospital Stay (HOSPITAL_COMMUNITY)
Admission: EM | Admit: 2016-06-23 | Discharge: 2016-06-26 | DRG: 683 | Disposition: A | Discharge status: Home Health | Payer: Medicaid Other | Attending: Internal Medicine | Admitting: Internal Medicine

## 2016-06-23 ENCOUNTER — Emergency Department (HOSPITAL_COMMUNITY): Payer: Medicaid Other

## 2016-06-23 ENCOUNTER — Encounter (HOSPITAL_COMMUNITY): Payer: Self-pay | Admitting: Emergency Medicine

## 2016-06-23 DIAGNOSIS — Z7982 Long term (current) use of aspirin: Secondary | ICD-10-CM

## 2016-06-23 DIAGNOSIS — N179 Acute kidney failure, unspecified: Principal | ICD-10-CM

## 2016-06-23 DIAGNOSIS — N189 Chronic kidney disease, unspecified: Secondary | ICD-10-CM | POA: Diagnosis present

## 2016-06-23 DIAGNOSIS — E039 Hypothyroidism, unspecified: Secondary | ICD-10-CM | POA: Diagnosis present

## 2016-06-23 DIAGNOSIS — G3184 Mild cognitive impairment, so stated: Secondary | ICD-10-CM | POA: Insufficient documentation

## 2016-06-23 DIAGNOSIS — E860 Dehydration: Secondary | ICD-10-CM

## 2016-06-23 DIAGNOSIS — I2510 Atherosclerotic heart disease of native coronary artery without angina pectoris: Secondary | ICD-10-CM | POA: Diagnosis present

## 2016-06-23 DIAGNOSIS — N390 Urinary tract infection, site not specified: Secondary | ICD-10-CM | POA: Diagnosis present

## 2016-06-23 DIAGNOSIS — J449 Chronic obstructive pulmonary disease, unspecified: Secondary | ICD-10-CM | POA: Diagnosis present

## 2016-06-23 DIAGNOSIS — I129 Hypertensive chronic kidney disease with stage 1 through stage 4 chronic kidney disease, or unspecified chronic kidney disease: Secondary | ICD-10-CM | POA: Diagnosis present

## 2016-06-23 DIAGNOSIS — R740 Nonspecific elevation of levels of transaminase and lactic acid dehydrogenase [LDH]: Secondary | ICD-10-CM | POA: Diagnosis present

## 2016-06-23 DIAGNOSIS — I69398 Other sequelae of cerebral infarction: Secondary | ICD-10-CM

## 2016-06-23 DIAGNOSIS — I951 Orthostatic hypotension: Secondary | ICD-10-CM | POA: Diagnosis present

## 2016-06-23 DIAGNOSIS — J45901 Unspecified asthma with (acute) exacerbation: Secondary | ICD-10-CM

## 2016-06-23 DIAGNOSIS — E0590 Thyrotoxicosis, unspecified without thyrotoxic crisis or storm: Secondary | ICD-10-CM | POA: Diagnosis present

## 2016-06-23 DIAGNOSIS — I252 Old myocardial infarction: Secondary | ICD-10-CM

## 2016-06-23 DIAGNOSIS — Z79899 Other long term (current) drug therapy: Secondary | ICD-10-CM

## 2016-06-23 DIAGNOSIS — Z951 Presence of aortocoronary bypass graft: Secondary | ICD-10-CM

## 2016-06-23 DIAGNOSIS — Z7951 Long term (current) use of inhaled steroids: Secondary | ICD-10-CM

## 2016-06-23 DIAGNOSIS — Z7902 Long term (current) use of antithrombotics/antiplatelets: Secondary | ICD-10-CM

## 2016-06-23 DIAGNOSIS — J441 Chronic obstructive pulmonary disease with (acute) exacerbation: Secondary | ICD-10-CM

## 2016-06-23 DIAGNOSIS — D72829 Elevated white blood cell count, unspecified: Secondary | ICD-10-CM

## 2016-06-23 DIAGNOSIS — R197 Diarrhea, unspecified: Secondary | ICD-10-CM

## 2016-06-23 DIAGNOSIS — R569 Unspecified convulsions: Secondary | ICD-10-CM

## 2016-06-23 DIAGNOSIS — Z87891 Personal history of nicotine dependence: Secondary | ICD-10-CM

## 2016-06-23 DIAGNOSIS — E785 Hyperlipidemia, unspecified: Secondary | ICD-10-CM | POA: Diagnosis present

## 2016-06-23 DIAGNOSIS — K219 Gastro-esophageal reflux disease without esophagitis: Secondary | ICD-10-CM | POA: Diagnosis present

## 2016-06-23 HISTORY — DX: Unspecified convulsions: R56.9

## 2016-06-23 HISTORY — DX: Cerebral infarction, unspecified: I63.9

## 2016-06-23 NOTE — ED Provider Notes (Signed)
[facility] DEPT Provider Note   CSN: [id number] Arrival date & time: 06/23/16  2200  By signing my name below, I, Ethelle Lyon Long, attest that this documentation has been prepared under the direction and in the presence of Carlisle Cater, PA-C. Electronically Signed: Ethelle Lyon Long, Scribe. 06/23/2016. 11:10 PM.  History   Chief Complaint Chief Complaint  Patient presents with  . Seizures   The history is provided by the EMS personnel, medical records, the patient and a relative. No language interpreter was used.    HPI Comments:  Bethany Sosa is a 61 y.o. female with a PMhx of CAD, Hyperthyroidism, Seizures, COPD, Asthma, and CVA, who presents to the Emergency Department by way of ambulance s/p a seizure this evening. Per EMS, pt requested to use the bedside commode this evening when she began to show seizure like activity witnessed by the family. Upon arrival, pt was "cool, clammy, and unconscious." Pt states with past seizures, she normally feels her right leg begin to shake and move prior to all over convulsions. However, tonight she did have any warning signs. Her daughter states the last seizure prior to today, was yesterday in which she loss consciousness. During these episodes, she is aware of her surroundings and can sometimes speak during them with some episodes being worse than others. She does not have incontinence bladder/bowel or biting of her tongue during episodes but reports these seizures are occurring more often and with increased severity, having one almost every day this week. Pt is currently on anticoagulant therapy. Pt denies HA, nausea, vomiting, fever, cough, sneezing, CP, SOB, neck pain or pain anywhere else, and any other complaints at this time.  Today she had multiple episodes of watery, non-bloody diarrhea. Last episode on arrival to ED.    Past Medical History:  Diagnosis Date  . Allergic rhinitis   . Asthma   . COPD (chronic obstructive pulmonary  disease) ([place])   . Coronary artery disease   . History of hysterectomy   . Hyperthyroidism   . Seizures ([place])   . Stroke [facility])     Patient Active Problem List   Diagnosis Date Noted  . GERD (gastroesophageal reflux disease) 12/07/2015  . Obstructive lung disease ([place]) 11/19/2014  . Moderate persistent asthma 11/19/2014  . Allergic rhinitis 11/19/2014    Past Surgical History:  Procedure Laterality Date  . CORONARY ARTERY BYPASS GRAFT    . open heart surgery      OB History    No data available       Home Medications    Prior to Admission medications   Medication Sig Start Date End Date Taking? Authorizing Provider  albuterol (PROAIR HFA) 108 (90 Base) MCG/ACT inhaler Inhale 2 puffs into the lungs every 4 (four) hours as needed for wheezing or shortness of breath. 04/07/16   Jiles Prows, MD  albuterol (PROVENTIL) (2.5 MG/3ML) 0.083% nebulizer solution Take 2.5 mg by nebulization every 4 (four) hours as needed for wheezing or shortness of breath.    Historical Provider, MD  aspirin 81 MG EC tablet Take 81 mg by mouth daily. Swallow whole.    Historical Provider, MD  azelastine (ASTELIN) 0.1 % nasal spray Place 1 spray into both nostrils 2 (two) times daily. 07/12/15   Jiles Prows, MD  budesonide-formoterol [facility]) 160-4.5 MCG/ACT inhaler Inhale two puffs twice daily to prevent cough or wheeze 06/12/16   Jiles Prows, MD  dextromethorphan-guaiFENesin [facility] DM) 30-600 MG 12hr tablet Take 1 tablet  by mouth 2 (two) times daily as needed for cough.    Historical Provider, MD  levothyroxine (SYNTHROID, LEVOTHROID) 125 MCG tablet Take 125 mcg by mouth daily.    Historical Provider, MD  metoprolol tartrate (LOPRESSOR) 25 MG tablet Take 25 mg by mouth 2 (two) times daily.    Historical Provider, MD  montelukast (SINGULAIR) 10 MG tablet Take 10 mg by mouth. 11/23/15 12/23/15  Historical Provider, MD  omeprazole (PRILOSEC) 40 MG capsule TAKE ONE CAPSULE BY MOUTH IN THE MORNING  BEFORE  BREAKFAST 02/07/16   Jiles Prows, MD  predniSONE (DELTASONE) 20 MG tablet Take 3 tablets daily for 3 days then take 2 tablets daily for 3 days then take 1 tablet daily for 3 days 03/09/16   Historical Provider, MD  QVAR 80 MCG/ACT inhaler INHALE TWO PUFFS INTO THE LUNGS TWICE DAILY. 12/17/15   Jiles Prows, MD  ranitidine (ZANTAC) 300 MG tablet TAKE ONE TABLET BY MOUTH IN THE EVENING 02/23/16   Jiles Prows, MD  Simvastatin (ZOCOR PO) Take by mouth.    Historical Provider, MD  tiotropium (SPIRIVA HANDIHALER) 18 MCG inhalation capsule Inhale the contents of one capsule once daily as directed. 02/23/16   Jiles Prows, MD    Family History Family History  Problem Relation Age of Onset  . Asthma Father     Social History Social History  Substance Use Topics  . Smoking status: Former Smoker    Types: Cigarettes  . Smokeless tobacco: Never Used  . Alcohol use No     Allergies   Codeine   Review of Systems Review of Systems  Constitutional: Negative for fever.  HENT: Negative for sneezing.   Respiratory: Negative for cough and shortness of breath.   Cardiovascular: Negative for chest pain.  Gastrointestinal: Positive for diarrhea. Negative for blood in stool, nausea and vomiting.  Genitourinary: Negative for pelvic pain.  Musculoskeletal: Negative for neck pain.  Neurological: Positive for seizures. Negative for headaches.     Physical Exam Updated Vital Signs Pulse 87   Resp 16   Ht 5' (1.524 m)   Wt 173 lb (78.5 kg)   SpO2 100%   BMI 33.79 kg/m   Physical Exam  Constitutional: She is oriented to person, place, and time. She appears well-developed and well-nourished.  HENT:  Head: Normocephalic and atraumatic.  Mouth/Throat: Oropharynx is clear and moist.  Eyes: Conjunctivae are normal. Right eye exhibits no discharge. Left eye exhibits no discharge.  Neck: Normal range of motion. Neck supple.  Cardiovascular: Normal rate, regular rhythm and normal  heart sounds.   Pulmonary/Chest: Effort normal and breath sounds normal.  Abdominal: Soft. She exhibits no distension. There is no tenderness.  Musculoskeletal: Normal range of motion.  Neurological: She is alert and oriented to person, place, and time. She has normal strength. No cranial nerve deficit or sensory deficit. She displays a negative Romberg sign.  Skin: Skin is warm and dry.  Psychiatric: She has a normal mood and affect.  Nursing note and vitals reviewed.    ED Treatments / Results  DIAGNOSTIC STUDIES:  Oxygen Saturation is 100% with [place] on 3L, normal  by my interpretation.    COORDINATION OF CARE:  11:08 PM Discussed treatment plan with pt at bedside including CBC, CMP, and CT Head without contrast and pt agreed to plan.  Labs (all labs ordered are listed, but only abnormal results are displayed) Labs Reviewed  CBC WITH DIFFERENTIAL/PLATELET - Abnormal; Notable for the following:  Result Value   WBC 14.2 (*)    Neutro Abs 11.3 (*)    All other components within normal limits  COMPREHENSIVE METABOLIC PANEL - Abnormal; Notable for the following:    CO2 21 (*)    Glucose, Bld 113 (*)    Creatinine, Ser 1.83 (*)    AST 49 (*)    ALT 55 (*)    GFR calc non Af Amer 28 (*)    GFR calc Af Amer 33 (*)    All other components within normal limits  URINALYSIS, ROUTINE W REFLEX MICROSCOPIC  CK  MAGNESIUM    Radiology Ct Head Wo Contrast  Result Date: 06/23/2016 CLINICAL DATA:  Acute onset of seizure and syncope. Generalized weakness. Initial encounter. EXAM: CT HEAD WITHOUT CONTRAST TECHNIQUE: Contiguous axial images were obtained from the base of the skull through the vertex without intravenous contrast. COMPARISON:  CT of the head performed 05/05/2016 FINDINGS: Brain: No evidence of acute infarction, hemorrhage, hydrocephalus, extra-axial collection or mass lesion/mass effect. Prominence of the ventricles and sulci reflects mild cortical volume loss. Mild  cerebellar atrophy is noted. A chronic infarct is noted at the left frontal lobe, with associated encephalomalacia. The brainstem and fourth ventricle are within normal limits. The basal ganglia are unremarkable in appearance. The cerebral hemispheres demonstrate grossly normal gray-white differentiation. No mass effect or midline shift is seen. Vascular: No hyperdense vessel or unexpected calcification. Skull: There is no evidence of fracture; visualized osseous structures are unremarkable in appearance. Sinuses/Orbits: The visualized portions of the orbits are within normal limits. The paranasal sinuses and mastoid air cells are well-aerated. Other: No significant soft tissue abnormalities are seen. IMPRESSION: 1. No acute intracranial pathology seen on CT. 2. Mild cortical volume loss. 3. 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 Electronically Signed   By: Garald Balding M.D.   On: 06/23/2016 23:18    Procedures Procedures (including critical care time)  Medications Ordered in ED Medications  gemfibrozil (LOPID) tablet 600 mg (not administered)  clopidogrel (PLAVIX) tablet 75 mg (not administered)  citalopram (CELEXA) tablet 20 mg (not administered)  famotidine (PEPCID) tablet 40 mg (not administered)  pantoprazole (PROTONIX) EC tablet 40 mg (not administered)  tiotropium (SPIRIVA) inhalation capsule 18 mcg (not administered)  aspirin EC tablet 81 mg (not administered)  ipratropium-albuterol (DUONEB) 0.5-2.5 (3) MG/3ML nebulizer solution 3 mL (not administered)  levothyroxine (SYNTHROID, LEVOTHROID) tablet 125 mcg (not administered)  0.9 %  sodium chloride infusion (not administered)  levETIRAcetam (KEPPRA) tablet 500 mg (not administered)  budesonide (PULMICORT) nebulizer solution 0.25 mg (not administered)  levETIRAcetam (KEPPRA) tablet 1,000 mg (1,000 mg Oral Given 06/24/16 0123)  sodium chloride 0.9 % bolus 1,000 mL (1,000 mLs Intravenous New Bag/Given  06/24/16 0314)     Initial Impression / Assessment and Plan / ED Course  I have reviewed the triage vital signs and the nursing notes.  Pertinent labs & imaging results that were available during my care of the patient were reviewed by me and considered in my medical decision making (see chart for details).     Vital signs reviewed and are as follows: Vitals:   06/23/16 2335 06/24/16 0145  BP: 115/74 95/80  Pulse: 86 83  Resp: 14 15  Temp: 97.6 F (36.4 C)     Orthostatic VS for the past 24 hrs:  BP- Lying Pulse- Lying BP- Sitting Pulse- Sitting BP- Standing at 0 minutes Pulse- Standing at 0 minutes  06/24/16 0234 90/58 84 (!) 86/67  92 106/88 97   Spoke with Dr. Leonel Ramsay earlier. Reccs Keppra. New AKI. Spoke with pharmacy to confirm dosage. 500mg  bid, 1000mg  load. Pt does not have IV access -- will give orally and monitor for an hr.   Patient's BP continues to be soft. She is orthostatic. Will admit for IV fluids with AKI. RN to start IV in foot.     Spoke with Dr. Tamala Julian who will evaluate patient.   Final Clinical Impressions(s) / ED Diagnoses   Final diagnoses:  Seizure ([place])  Diarrhea, unspecified type  Acute kidney injury ([place])  Dehydration   Admit.   New Prescriptions Current Discharge Medication List     I personally performed the services described in this documentation, which was scribed in my presence. The recorded information has been reviewed and is accurate.     Carlisle Cater, PA-C 06/24/16 [place], MD 06/24/16 [phone]

## 2016-06-23 NOTE — ED Triage Notes (Signed)
Pt brought in by [facility] EMS from home  Pt told family she felt like she needed to have a BM and got up to the bedside commode and while sitting on the [facility] she started having a seizure witnessed by family   When EMS arrived pt was cool, clammy, and unconscious  Upon moving pt to the stretcher pt woke up and was alert and oriented x 4  Cardiac monitor read SR with PVCs  Oxygen applied at 3 liters/min via [place]  IV attempted x 2   Pt denies pain but states she is weak all over and sore

## 2016-06-23 NOTE — ED Notes (Signed)
Patient transported to CT 

## 2016-06-24 ENCOUNTER — Observation Stay (HOSPITAL_COMMUNITY): Payer: Medicaid Other

## 2016-06-24 DIAGNOSIS — I951 Orthostatic hypotension: Secondary | ICD-10-CM

## 2016-06-24 DIAGNOSIS — R569 Unspecified convulsions: Secondary | ICD-10-CM

## 2016-06-24 DIAGNOSIS — E860 Dehydration: Secondary | ICD-10-CM | POA: Diagnosis present

## 2016-06-24 DIAGNOSIS — N179 Acute kidney failure, unspecified: Secondary | ICD-10-CM

## 2016-06-24 DIAGNOSIS — Z87891 Personal history of nicotine dependence: Secondary | ICD-10-CM | POA: Diagnosis not present

## 2016-06-24 DIAGNOSIS — Z7902 Long term (current) use of antithrombotics/antiplatelets: Secondary | ICD-10-CM | POA: Diagnosis not present

## 2016-06-24 DIAGNOSIS — I69398 Other sequelae of cerebral infarction: Secondary | ICD-10-CM | POA: Diagnosis not present

## 2016-06-24 DIAGNOSIS — I2510 Atherosclerotic heart disease of native coronary artery without angina pectoris: Secondary | ICD-10-CM

## 2016-06-24 DIAGNOSIS — I252 Old myocardial infarction: Secondary | ICD-10-CM | POA: Diagnosis not present

## 2016-06-24 DIAGNOSIS — I129 Hypertensive chronic kidney disease with stage 1 through stage 4 chronic kidney disease, or unspecified chronic kidney disease: Secondary | ICD-10-CM | POA: Diagnosis present

## 2016-06-24 DIAGNOSIS — J449 Chronic obstructive pulmonary disease, unspecified: Secondary | ICD-10-CM | POA: Diagnosis present

## 2016-06-24 DIAGNOSIS — J45901 Unspecified asthma with (acute) exacerbation: Secondary | ICD-10-CM

## 2016-06-24 DIAGNOSIS — E039 Hypothyroidism, unspecified: Secondary | ICD-10-CM

## 2016-06-24 DIAGNOSIS — J441 Chronic obstructive pulmonary disease with (acute) exacerbation: Secondary | ICD-10-CM | POA: Diagnosis not present

## 2016-06-24 DIAGNOSIS — D72829 Elevated white blood cell count, unspecified: Secondary | ICD-10-CM

## 2016-06-24 DIAGNOSIS — E038 Other specified hypothyroidism: Secondary | ICD-10-CM

## 2016-06-24 DIAGNOSIS — Z951 Presence of aortocoronary bypass graft: Secondary | ICD-10-CM | POA: Diagnosis not present

## 2016-06-24 DIAGNOSIS — K219 Gastro-esophageal reflux disease without esophagitis: Secondary | ICD-10-CM | POA: Diagnosis present

## 2016-06-24 DIAGNOSIS — R197 Diarrhea, unspecified: Secondary | ICD-10-CM

## 2016-06-24 DIAGNOSIS — R740 Nonspecific elevation of levels of transaminase and lactic acid dehydrogenase [LDH]: Secondary | ICD-10-CM | POA: Diagnosis present

## 2016-06-24 DIAGNOSIS — D72823 Leukemoid reaction: Secondary | ICD-10-CM

## 2016-06-24 DIAGNOSIS — N390 Urinary tract infection, site not specified: Secondary | ICD-10-CM | POA: Diagnosis present

## 2016-06-24 DIAGNOSIS — E0590 Thyrotoxicosis, unspecified without thyrotoxic crisis or storm: Secondary | ICD-10-CM | POA: Diagnosis present

## 2016-06-24 DIAGNOSIS — R319 Hematuria, unspecified: Secondary | ICD-10-CM | POA: Diagnosis not present

## 2016-06-24 DIAGNOSIS — Z7982 Long term (current) use of aspirin: Secondary | ICD-10-CM | POA: Diagnosis not present

## 2016-06-24 DIAGNOSIS — Z7951 Long term (current) use of inhaled steroids: Secondary | ICD-10-CM | POA: Diagnosis not present

## 2016-06-24 DIAGNOSIS — N189 Chronic kidney disease, unspecified: Secondary | ICD-10-CM | POA: Diagnosis present

## 2016-06-24 DIAGNOSIS — Z79899 Other long term (current) drug therapy: Secondary | ICD-10-CM | POA: Diagnosis not present

## 2016-06-24 DIAGNOSIS — E785 Hyperlipidemia, unspecified: Secondary | ICD-10-CM | POA: Diagnosis present

## 2016-06-24 LAB — URINALYSIS, COMPLETE (UACMP) WITH MICROSCOPIC
BILIRUBIN URINE: NEGATIVE
GLUCOSE, UA: NEGATIVE mg/dL
Ketones, ur: NEGATIVE mg/dL
NITRITE: NEGATIVE
PH: 5 (ref 5.0–8.0)
Protein, ur: 30 mg/dL — AB
SPECIFIC GRAVITY, URINE: 1.016 (ref 1.005–1.030)

## 2016-06-24 LAB — COMPREHENSIVE METABOLIC PANEL
ALK PHOS: 76 U/L (ref 38–126)
ALT: 55 U/L — AB (ref 14–54)
AST: 49 U/L — ABNORMAL HIGH (ref 15–41)
Albumin: 3.8 g/dL (ref 3.5–5.0)
Anion gap: 8 (ref 5–15)
BILIRUBIN TOTAL: 1 mg/dL (ref 0.3–1.2)
BUN: 18 mg/dL (ref 6–20)
CALCIUM: 9 mg/dL (ref 8.9–10.3)
CO2: 21 mmol/L — ABNORMAL LOW (ref 22–32)
CREATININE: 1.83 mg/dL — AB (ref 0.44–1.00)
Chloride: 110 mmol/L (ref 101–111)
GFR calc non Af Amer: 28 mL/min — ABNORMAL LOW (ref >60)
GFR, EST AFRICAN AMERICAN: 33 mL/min — AB (ref >60)
Glucose, Bld: 113 mg/dL — ABNORMAL HIGH (ref 65–99)
Potassium: 3.8 mmol/L (ref 3.5–5.1)
SODIUM: 139 mmol/L (ref 135–145)
TOTAL PROTEIN: 6.6 g/dL (ref 6.5–8.1)

## 2016-06-24 LAB — CBC WITH DIFFERENTIAL/PLATELET
Basophils Absolute: 0 10*3/uL (ref 0.0–0.1)
Basophils Relative: 0 %
Eosinophils Absolute: 0.1 10*3/uL (ref 0.0–0.7)
Eosinophils Relative: 1 %
HEMATOCRIT: 40.8 % (ref 36.0–46.0)
HEMOGLOBIN: 14.3 g/dL (ref 12.0–15.0)
LYMPHS ABS: 2.1 10*3/uL (ref 0.7–4.0)
LYMPHS PCT: 15 %
MCH: 33.3 pg (ref 26.0–34.0)
MCHC: 35 g/dL (ref 30.0–36.0)
MCV: 94.9 fL (ref 78.0–100.0)
MONOS PCT: 5 %
Monocytes Absolute: 0.7 10*3/uL (ref 0.1–1.0)
NEUTROS ABS: 11.3 10*3/uL — AB (ref 1.7–7.7)
NEUTROS PCT: 79 %
Platelets: 244 10*3/uL (ref 150–400)
RBC: 4.3 MIL/uL (ref 3.87–5.11)
RDW: 13.2 % (ref 11.5–15.5)
WBC: 14.2 10*3/uL — ABNORMAL HIGH (ref 4.0–10.5)

## 2016-06-24 LAB — MAGNESIUM: Magnesium: 2.1 mg/dL (ref 1.7–2.4)

## 2016-06-24 LAB — TROPONIN I

## 2016-06-24 LAB — RAPID URINE DRUG SCREEN, HOSP PERFORMED
AMPHETAMINES: NOT DETECTED
BARBITURATES: NOT DETECTED
Benzodiazepines: NOT DETECTED
Cocaine: NOT DETECTED
OPIATES: NOT DETECTED
TETRAHYDROCANNABINOL: NOT DETECTED

## 2016-06-24 LAB — TSH: TSH: 61.286 u[IU]/mL — ABNORMAL HIGH (ref 0.350–4.500)

## 2016-06-24 LAB — CK: Total CK: 259 U/L — ABNORMAL HIGH (ref 38–234)

## 2016-06-24 MED ORDER — GEMFIBROZIL 600 MG PO TABS
600.0000 mg | ORAL_TABLET | Freq: Two times a day (BID) | ORAL | Status: DC
  Administered 2016-06-24 – 2016-06-26 (×5): 600 mg via ORAL
  Filled 2016-06-24 (×6): qty 1

## 2016-06-24 MED ORDER — CLOPIDOGREL BISULFATE 75 MG PO TABS
75.0000 mg | ORAL_TABLET | Freq: Every day | ORAL | Status: DC
  Administered 2016-06-24 – 2016-06-26 (×3): 75 mg via ORAL
  Filled 2016-06-24 (×3): qty 1

## 2016-06-24 MED ORDER — ONDANSETRON HCL 4 MG PO TABS
4.0000 mg | ORAL_TABLET | Freq: Four times a day (QID) | ORAL | Status: DC | PRN
  Administered 2016-06-24: 4 mg via ORAL
  Filled 2016-06-24 (×2): qty 1

## 2016-06-24 MED ORDER — ROSUVASTATIN CALCIUM 10 MG PO TABS
40.0000 mg | ORAL_TABLET | Freq: Every day | ORAL | Status: DC
Start: 2016-06-24 — End: 2016-06-26
  Administered 2016-06-24 – 2016-06-26 (×3): 40 mg via ORAL
  Filled 2016-06-24 (×3): qty 4

## 2016-06-24 MED ORDER — CITALOPRAM HYDROBROMIDE 20 MG PO TABS
20.0000 mg | ORAL_TABLET | Freq: Every day | ORAL | Status: DC
  Administered 2016-06-24 – 2016-06-26 (×3): 20 mg via ORAL
  Filled 2016-06-24 (×5): qty 1

## 2016-06-24 MED ORDER — LEVETIRACETAM 500 MG PO TABS
1000.0000 mg | ORAL_TABLET | Freq: Once | ORAL | Status: AC
  Administered 2016-06-24: 1000 mg via ORAL
  Filled 2016-06-24: qty 2

## 2016-06-24 MED ORDER — BECLOMETHASONE DIPROPIONATE 80 MCG/ACT IN AERS: 2.0000 | INHALATION_SPRAY | Freq: Two times a day (BID) | RESPIRATORY_TRACT | Status: DC

## 2016-06-24 MED ORDER — ENOXAPARIN SODIUM 30 MG/0.3ML ~~LOC~~ SOLN
30.0000 mg | SUBCUTANEOUS | Status: DC
  Administered 2016-06-24 – 2016-06-25 (×2): 30 mg via SUBCUTANEOUS
  Filled 2016-06-24 (×2): qty 0.3

## 2016-06-24 MED ORDER — ALBUTEROL SULFATE (2.5 MG/3ML) 0.083% IN NEBU: 2.5000 mg | INHALATION_SOLUTION | RESPIRATORY_TRACT | Status: DC | PRN

## 2016-06-24 MED ORDER — FAMOTIDINE 20 MG PO TABS: 40.0000 mg | ORAL_TABLET | Freq: Every day | ORAL | Status: DC

## 2016-06-24 MED ORDER — BUDESONIDE 0.25 MG/2ML IN SUSP: 0.2500 mg | Freq: Two times a day (BID) | RESPIRATORY_TRACT | Status: DC

## 2016-06-24 MED ORDER — LEVETIRACETAM 500 MG PO TABS
500.0000 mg | ORAL_TABLET | Freq: Two times a day (BID) | ORAL | Status: DC
  Administered 2016-06-24 – 2016-06-26 (×5): 500 mg via ORAL
  Filled 2016-06-24 (×5): qty 1

## 2016-06-24 MED ORDER — TIOTROPIUM BROMIDE MONOHYDRATE 18 MCG IN CAPS
18.0000 ug | ORAL_CAPSULE | Freq: Every day | RESPIRATORY_TRACT | Status: DC
  Administered 2016-06-24 – 2016-06-26 (×3): 18 ug via RESPIRATORY_TRACT
  Filled 2016-06-24: qty 5

## 2016-06-24 MED ORDER — SODIUM CHLORIDE 0.9 % IV SOLN
INTRAVENOUS | Status: DC
  Administered 2016-06-24 (×2): via INTRAVENOUS

## 2016-06-24 MED ORDER — LEVOTHYROXINE SODIUM 125 MCG PO TABS
125.0000 ug | ORAL_TABLET | Freq: Every day | ORAL | Status: DC
  Administered 2016-06-24 – 2016-06-25 (×2): 125 ug via ORAL
  Filled 2016-06-24 (×2): qty 1

## 2016-06-24 MED ORDER — PANTOPRAZOLE SODIUM 40 MG PO TBEC
40.0000 mg | DELAYED_RELEASE_TABLET | Freq: Every day | ORAL | Status: DC
  Administered 2016-06-24 – 2016-06-26 (×3): 40 mg via ORAL
  Filled 2016-06-24 (×3): qty 1

## 2016-06-24 MED ORDER — SODIUM CHLORIDE 0.9 % IV SOLN
1000.0000 mg | Freq: Once | INTRAVENOUS | Status: DC
  Filled 2016-06-24: qty 10

## 2016-06-24 MED ORDER — ACETAMINOPHEN 650 MG RE SUPP: 650.0000 mg | RECTAL | Status: DC | PRN

## 2016-06-24 MED ORDER — MOMETASONE FURO-FORMOTEROL FUM 200-5 MCG/ACT IN AERO
2.0000 | INHALATION_SPRAY | Freq: Two times a day (BID) | RESPIRATORY_TRACT | Status: DC
  Administered 2016-06-24 – 2016-06-26 (×5): 2 via RESPIRATORY_TRACT
  Filled 2016-06-24: qty 8.8

## 2016-06-24 MED ORDER — SODIUM CHLORIDE 0.9 % IV BOLUS (SEPSIS)
1000.0000 mL | Freq: Once | INTRAVENOUS | Status: AC
  Administered 2016-06-24: 1000 mL via INTRAVENOUS

## 2016-06-24 MED ORDER — ACETAMINOPHEN 325 MG PO TABS: 650.0000 mg | ORAL_TABLET | ORAL | Status: DC | PRN

## 2016-06-24 MED ORDER — ONDANSETRON HCL 4 MG/2ML IJ SOLN
4.0000 mg | Freq: Four times a day (QID) | INTRAMUSCULAR | Status: DC | PRN
Start: 2016-06-24 — End: 2016-06-26

## 2016-06-24 MED ORDER — FAMOTIDINE 20 MG PO TABS
20.0000 mg | ORAL_TABLET | Freq: Every day | ORAL | Status: DC
  Administered 2016-06-24 – 2016-06-25 (×2): 20 mg via ORAL
  Filled 2016-06-24 (×2): qty 1

## 2016-06-24 MED ORDER — ENOXAPARIN SODIUM 40 MG/0.4ML ~~LOC~~ SOLN: 40.0000 mg | SUBCUTANEOUS | Status: DC

## 2016-06-24 MED ORDER — LORAZEPAM 2 MG/ML IJ SOLN: 1.0000 mg | INTRAMUSCULAR | Status: DC | PRN

## 2016-06-24 MED ORDER — SODIUM CHLORIDE 0.9 % IV SOLN: 75.0000 mL/h | INTRAVENOUS | Status: DC

## 2016-06-24 MED ORDER — IPRATROPIUM-ALBUTEROL 0.5-2.5 (3) MG/3ML IN SOLN
3.0000 mL | RESPIRATORY_TRACT | Status: DC | PRN
Start: 2016-06-24 — End: 2016-06-24

## 2016-06-24 MED ORDER — LEVOTHYROXINE SODIUM 150 MCG PO TABS: 150.0000 ug | ORAL_TABLET | Freq: Every day | ORAL | Status: DC

## 2016-06-24 MED ORDER — ASPIRIN EC 81 MG PO TBEC
81.0000 mg | DELAYED_RELEASE_TABLET | Freq: Every day | ORAL | Status: DC
  Administered 2016-06-24 – 2016-06-26 (×3): 81 mg via ORAL
  Filled 2016-06-24 (×3): qty 1

## 2016-06-24 NOTE — Evaluation (Signed)
Physical Therapy Evaluation Patient Details Name: Bethany Sosa MRN: [id number] DOB: 10-31-1955 Today's Date: 06/24/2016   History of Present Illness  Bethany Sosa is a 61 y.o. female with medical history significant of COPD, CAD, MI, hypothyroidism, seizures, left frontal CVA in 2/18; who presents with after having seizure at home yesterday evening.  Clinical Impression  Pt admitted with above diagnosis. Pt currently with functional limitations due to the deficits listed below (see PT Problem List). * Pt will benefit from skilled PT to increase their independence and safety with mobility to allow discharge to the venue listed below.   O2 sats 99-100% on RA;  Pt fatigued, very unsteady with gait today with LOB x 2; recommend HHPT at D/C    Follow Up Recommendations Home health PT;Supervision for mobility/OOB    Equipment Recommendations  None recommended by PT    Recommendations for Other Services       Precautions / Restrictions Precautions Precautions: Fall Restrictions Weight Bearing Restrictions: No      Mobility  Bed Mobility Overal bed mobility: Needs Assistance Bed Mobility: Supine to Sit;Sit to Supine     Supine to sit: Min guard Sit to supine: Min guard   General bed mobility comments: incr time and effort to transition to sit; min-guard for safety  Transfers Overall transfer level: Needs assistance Equipment used: Rolling walker (2 wheeled) Transfers: Sit to/from Stand Sit to Stand: Min assist         General transfer comment: assist to rise and stabilize; cues for safety--to back up to surface  Ambulation/Gait Ambulation/Gait assistance: Min assist Ambulation Distance (Feet): 110 Feet Assistive device: Rolling walker (2 wheeled) Gait Pattern/deviations: Step-through pattern;Decreased stride length;Narrow base of support;Scissoring     General Gait Details: pt very unsteady, LOB x 2 with min assist to recover; assist to maneuver RW around  objects; scissoring with turns  Science writer    Modified Rankin (Stroke Patients Only)       Balance Overall balance assessment: Needs assistance Sitting-balance support: No upper extremity supported;Feet supported Sitting balance-Leahy Scale: Fair       Standing balance-Leahy Scale: Fair Standing balance comment: able to maintain static standing without support; LOB x2 with dynamic activities even with UE support                             Pertinent Vitals/Pain Pain Assessment: No/denies pain    Home Living Family/patient expects to be discharged to:: Private residence Living Arrangements: Children Available Help at Discharge: Available PRN/intermittently Type of Home: [place]: One level (3 stairs to get to living area ) Home Equipment: Gilford Rile - 2 wheels;Bedside commode Additional Comments: dtr has been staying with pt "for awhile"    Prior Function Level of Independence: Independent with assistive device(s);Independent         Comments: pt reports Ind with ADLS, mod I amb with RW in and outside home     Hand Dominance        Extremity/Trunk Assessment   Upper Extremity Assessment Upper Extremity Assessment: Overall WFL for tasks assessed    Lower Extremity Assessment Lower Extremity Assessment: Overall WFL for tasks assessed       Communication   Communication: No difficulties  Cognition Arousal/Alertness: Awake/alert (sleepy) Behavior During Therapy: WFL for tasks assessed/performed;Flat affect Overall Cognitive Status: Within Functional Limits  for tasks assessed                                        General Comments      Exercises     Assessment/Plan    PT Assessment Patient needs continued PT services  PT Problem List Decreased activity tolerance;Decreased balance;Decreased mobility;Decreased coordination       PT Treatment Interventions DME  instruction;Gait training;Functional mobility training;Stair training;Therapeutic activities;Therapeutic exercise;Patient/family education;Balance training    PT Goals (Current goals can be found in the Care Plan section)  Acute Rehab PT Goals Patient Stated Goal: back to independence PT Goal Formulation: With patient/family Time For Goal Achievement: 07/01/16 Potential to Achieve Goals: Good    Frequency Min 3X/week   Barriers to discharge        Co-evaluation               End of Session Equipment Utilized During Treatment: Gait belt Activity Tolerance: Patient tolerated treatment well Patient left: in bed;with call bell/phone within reach;with family/visitor present   PT Visit Diagnosis: Unsteadiness on feet (R26.81)    Time: 6381-7711 PT Time Calculation (min) (ACUTE ONLY): 14 min   Charges:   PT Evaluation $PT Eval Low Complexity: 1 Procedure     PT G CodesKenyon Ana, PT Pager: [phone] 06/24/2016   [facility] 06/24/2016, 3:00 PM

## 2016-06-24 NOTE — Progress Notes (Signed)
PROGRESS NOTE  Bethany Sosa XLK:[id number] DOB: 1955-06-22 DOA: 06/23/2016 PCP: Janie Morning, DO  Brief History:  61 year old female with a history of coronary artery disease, COPD, hypothyroidism, hyperlipidemia presenting after seizure-like activity. The patient was getting up after using the commode when she felt right leg shaking that subsequently progressed up the rest of her body resulting in "whole body convulsing" witnessed by daughter. This resulted in loss of consciousness that lasted approximately 3 minutes. According to the family, the patient has had increasing episodes of these spells in the past week prior to admission.  The family also states that she has had these prior episodes where she is awake and aware for surroundings even as she has had "whole body convulsions".  There is no loss of bowel or bladder function. Patient is not better to him. The patient went to see her primary care provider on the morning prior to admission with same complaints. Referral was provided to see a neurologist in the outpatient setting. However, after the patient went home, she had the episode on the commode which prompted her to come to the emergency department. The patient denies any new medications and denies any illicit drug use or over-the-counter medications. According to medical records, the patient had similar complaints back in January 2018 when she went to see her primary care provider. Per Dr. Janie Morning' notes, it was felt these spells were related to muscle spasms. In the emergency department, the patient was noted to have WBC 14.2 and elevated serum creatinine of 1.83.  As result, the patient was admitted for further observation.The case was discussed with neurology, Dr. Leonel Ramsay who recommended Keppra load and continuing on maintenance Keppra and follow up with outpatient neurology.  Assessment/Plan: Seizure-like activity -MRI brain -EEG -Continue Keppra -Urine drug  screen  Acute kidney injury -Patient had normal serum creatinine dating back to 10/26/2011 -Renal ultrasound -IV fluids -Urinalysis -Holding lisinopril  Hypotension -Likely due to volume depletion -Improved with fluids  Hypertension -Holding lisinopril secondary to soft blood pressure -Holding metoprolol tortured secondary to soft blood pressure  CAD -Continue aspirin and Plavix -Continue statin -No anginal symptoms  COPD -Patient quit smoking February 2018 -Stable presently -Continue LABA and Spiriva  Syncope -due to volume depletion -continue tele  Hypothyroidism -Continue Synthroid -Check TSH  Hyperlipidemia  -Continue gemfibrozil and statin   Leukocytosis -CXR -UA -Holding antibiotics as the patient is afebrile and clinically stable  Disposition Plan:   Home 4/15 if stable  Family Communication:   Daughter updated at bedside--Total time spent 35 minutes.  Greater than 50% spent face to face counseling and coordinating care. 0720 to [phone]  Consultants:  Neurology on phone  Code Status:  FULL   DVT Prophylaxis:  [place] Lovenox   Procedures: As Listed in Progress Note Above  Antibiotics: None    Subjective: Patient denies fevers, chills, headache, chest pain, dyspnea, nausea, vomiting, diarrhea, abdominal pain, dysuria, hematuria, hematochezia, and melena.   Objective: Vitals:   06/24/16 0400 06/24/16 0430 06/24/16 0500 06/24/16 0558  BP: 103/64 110/78 111/76 112/76  Pulse:    99  Resp: (!) 21 18 17 16   Temp:    98.4 F (36.9 C)  TempSrc:    Oral  SpO2:    100%  Weight:      Height:       No intake or output data in the 24 hours ending 06/24/16 0744 Weight change:  Exam:   General:  Pt is alert, follows commands appropriately, not in acute distress  HEENT: No icterus, No thrush, No neck mass, [place]/AT  Cardiovascular: RRR, S1/S2, no rubs, no gallops  Respiratory: CTA bilaterally, no wheezing, no crackles, no rhonchi  Abdomen:  Soft/+BS, non tender, non distended, no guarding  Extremities: No edema, No lymphangitis, No petechiae, No rashes, no synovitis   Data Reviewed: I have personally reviewed following labs and imaging studies Basic Metabolic Panel:  Recent Labs Lab 06/24/16 0009 06/24/16 0626  NA 139  --   K 3.8  --   CL 110  --   CO2 21*  --   GLUCOSE 113*  --   BUN 18  --   CREATININE 1.83*  --   CALCIUM 9.0  --   MG  --  2.1   Liver Function Tests:  Recent Labs Lab 06/24/16 0009  AST 49*  ALT 55*  ALKPHOS 76  BILITOT 1.0  PROT 6.6  ALBUMIN 3.8   No results for input(s): LIPASE, AMYLASE in the last 168 hours. No results for input(s): AMMONIA in the last 168 hours. Coagulation Profile: No results for input(s): INR, PROTIME in the last 168 hours. CBC:  Recent Labs Lab 06/24/16 0009  WBC 14.2*  NEUTROABS 11.3*  HGB 14.3  HCT 40.8  MCV 94.9  PLT 244   Cardiac Enzymes:  Recent Labs Lab 06/24/16 0626  CKTOTAL 259*   BNP: Invalid input(s): POCBNP CBG: No results for input(s): GLUCAP in the last 168 hours. HbA1C: No results for input(s): HGBA1C in the last 72 hours. Urine analysis: No results found for: COLORURINE, APPEARANCEUR, LABSPEC, PHURINE, GLUCOSEU, HGBUR, BILIRUBINUR, KETONESUR, PROTEINUR, UROBILINOGEN, NITRITE, LEUKOCYTESUR Sepsis Labs: @LABRCNTIP (procalcitonin:4,lacticidven:4) )No results found for this or any previous visit (from the past 240 hour(s)).   Scheduled Meds: . aspirin EC  81 mg Oral Daily  . citalopram  20 mg Oral Daily  . clopidogrel  75 mg Oral Daily  . enoxaparin (LOVENOX) injection  30 mg Subcutaneous Q24H  . famotidine  40 mg Oral QHS  . gemfibrozil  600 mg Oral BID AC  . levETIRAcetam  500 mg Oral BID  . levothyroxine  125 mcg Oral QAC breakfast  . mometasone-formoterol  2 puff Inhalation BID  . pantoprazole  40 mg Oral Daily  . rosuvastatin  40 mg Oral Daily  . tiotropium  18 mcg Inhalation Daily   Continuous Infusions: .  sodium chloride 75 mL/hr at 06/24/16 0600  . sodium chloride      Procedures/Studies: Ct Head Wo Contrast  Result Date: 06/23/2016 CLINICAL DATA:  Acute onset of seizure and syncope. Generalized weakness. Initial encounter. EXAM: CT HEAD WITHOUT CONTRAST TECHNIQUE: Contiguous axial images were obtained from the base of the skull through the vertex without intravenous contrast. COMPARISON:  CT of the head performed 05/05/2016 FINDINGS: Brain: No evidence of acute infarction, hemorrhage, hydrocephalus, extra-axial collection or mass lesion/mass effect. Prominence of the ventricles and sulci reflects mild cortical volume loss. Mild cerebellar atrophy is noted. A chronic infarct is noted at the left frontal lobe, with associated encephalomalacia. The brainstem and fourth ventricle are within normal limits. The basal ganglia are unremarkable in appearance. The cerebral hemispheres demonstrate grossly normal gray-white differentiation. No mass effect or midline shift is seen. Vascular: No hyperdense vessel or unexpected calcification. Skull: There is no evidence of fracture; visualized osseous structures are unremarkable in appearance. Sinuses/Orbits: The visualized portions of the orbits are within normal limits. The paranasal sinuses and mastoid air cells are well-aerated.  Other: No significant soft tissue abnormalities are seen. IMPRESSION: 1. No acute intracranial pathology seen on CT. 2. Mild cortical volume loss. 3. 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 Electronically Signed   By: Garald Balding M.D.   On: 06/23/2016 23:18    Qiara Minetti, DO  [facility] Pager [phone]  If 7PM-7AM, please contact night-coverage [email or web address] Password [facility] 06/24/2016, 7:44 AM   LOS: 0 days

## 2016-06-24 NOTE — H&P (Addendum)
History and Physical    Bethany Sosa GLO:[id number] DOB: 1955-11-14 DOA: 06/23/2016  Referring MD/NP/PA: Carlisle Cater, PA-C PCP: Janie Morning, DO  Patient coming from: Home via EMS  Chief Complaint: Griffin Basil  HPI: Bethany Sosa is a 61 y.o. female with medical history significant of COPD, CAD, MI, hypothyroidism, seizures, left frontal CVA in 2/18; who presents with after having seizure at home yesterday evening. Patient had gone up to use her bedside commode when she started having generalized whole-body seizure activity witnessed by family. Seizure like activity was initially reported prior to her stroke in February of this year. Symptoms previously included right leg shaking, but following the stroke had noticed increased frequency of episodes with progression to whole-body convulsions. Now reporting a least one episode per day. She had followed up with her primary care provider and was being set up to follow with the neurologist. However, symptoms last night were different in that the patient lost consciousness which she had never done before. Normally, she is aware of her surroundings and can speak with some episodes. Denies any tongue biting, loss of bowel/bladder, chest pain, muscle pain, or shortness of breath. Patient reports quitting smoking back in February. She also reported associated symptoms of diarrhea.  ED Course: Neurology was consulted who recommended loading the patient with Keppra 1000 mg  and then starting patient on Keppra 500mg  BID and to allow her to follow-up as outpatient with her neurologist. Labs revealed creatinine 1.83 which was also noted to be new.  Review of Systems: As per HPI otherwise 10 point review of systems negative.   Past Medical History:  Diagnosis Date  . Allergic rhinitis   . Asthma   . COPD (chronic obstructive pulmonary disease) ([place])   . Coronary artery disease   . History of hysterectomy   . Hyperthyroidism   . Seizures ([place])   . Stroke  [facility])     Past Surgical History:  Procedure Laterality Date  . CORONARY ARTERY BYPASS GRAFT    . open heart surgery       reports that she has quit smoking. Her smoking use included Cigarettes. She has never used smokeless tobacco. She reports that she does not drink alcohol or use drugs.  Allergies  Allergen Reactions  . Codeine     GI issues    Family History  Problem Relation Age of Onset  . Asthma Father     Prior to Admission medications   Medication Sig Start Date End Date Taking? Authorizing Provider  albuterol (PROAIR HFA) 108 (90 Base) MCG/ACT inhaler Inhale 2 puffs into the lungs every 4 (four) hours as needed for wheezing or shortness of breath. 04/07/16  Yes Jiles Prows, MD  albuterol (PROVENTIL) (2.5 MG/3ML) 0.083% nebulizer solution Take 2.5 mg by nebulization every 4 (four) hours as needed for wheezing or shortness of breath.   Yes Historical Provider, MD  aspirin 81 MG EC tablet Take 81 mg by mouth daily. Swallow whole.   Yes Historical Provider, MD  azelastine (ASTELIN) 0.1 % nasal spray Place 1 spray into both nostrils 2 (two) times daily. Patient taking differently: Place 1 spray into both nostrils 2 (two) times daily as needed for rhinitis or allergies.  07/12/15  Yes Jiles Prows, MD  budesonide-formoterol [facility]) 160-4.5 MCG/ACT inhaler Inhale two puffs twice daily to prevent cough or wheeze 06/12/16  Yes Jiles Prows, MD  citalopram (CELEXA) 20 MG tablet Take 20 mg by mouth daily.   Yes Historical Provider,  MD  clopidogrel (PLAVIX) 75 MG tablet Take 75 mg by mouth daily.   Yes Historical Provider, MD  dextromethorphan-guaiFENesin (MUCINEX DM) 30-600 MG 12hr tablet Take 1 tablet by mouth 2 (two) times daily as needed for cough.   Yes Historical Provider, MD  gemfibrozil (LOPID) 600 MG tablet Take 600 mg by mouth 2 (two) times daily before a meal.   Yes Historical Provider, MD  levothyroxine (SYNTHROID, LEVOTHROID) 125 MCG tablet Take 125 mcg by mouth daily.    Yes Historical Provider, MD  levothyroxine (SYNTHROID, LEVOTHROID) 150 MCG tablet Take 150 mcg by mouth daily before breakfast.   Yes Historical Provider, MD  lisinopril (PRINIVIL,ZESTRIL) 10 MG tablet Take 10 mg by mouth daily.   Yes Historical Provider, MD  metoprolol tartrate (LOPRESSOR) 25 MG tablet Take 25 mg by mouth 2 (two) times daily.   Yes Historical Provider, MD  nitroGLYCERIN (NITROSTAT) 0.4 MG SL tablet Place 0.4 mg under the tongue every 5 (five) minutes as needed for chest pain.   Yes Historical Provider, MD  omeprazole (PRILOSEC) 40 MG capsule TAKE ONE CAPSULE BY MOUTH IN THE MORNING BEFORE  BREAKFAST 02/07/16  Yes Jiles Prows, MD  QVAR 80 MCG/ACT inhaler INHALE TWO PUFFS INTO THE LUNGS TWICE DAILY. 12/17/15  Yes Jiles Prows, MD  ranitidine (ZANTAC) 300 MG tablet TAKE ONE TABLET BY MOUTH IN THE EVENING 02/23/16  Yes Jiles Prows, MD  rosuvastatin (CRESTOR) 40 MG tablet Take 40 mg by mouth daily.   Yes Historical Provider, MD  tiotropium (SPIRIVA HANDIHALER) 18 MCG inhalation capsule Inhale the contents of one capsule once daily as directed. 02/23/16  Yes Jiles Prows, MD  montelukast (SINGULAIR) 10 MG tablet Take 10 mg by mouth. 11/23/15 12/23/15  Historical Provider, MD    Physical Exam:   Constitutional: Elderly female in NAD, calm, comfortable Vitals:   06/23/16 2246 06/23/16 2335 06/24/16 0145 06/24/16 0330  BP:  115/74 95/80 103/88  Pulse: 87 86 83   Resp: 16 14 15 20   Temp:  97.6 F (36.4 C)    TempSrc:  Oral    SpO2: 100% 100% 100%   Weight: 78.5 kg (173 lb)     Height: 5' (1.524 m)      Eyes: PERRL, lids and conjunctivae normal ENMT: Mucous membranes are moist. Posterior pharynx clear of any exudate or lesions. Neck: normal, supple, no masses, no thyromegaly Respiratory: clear to auscultation bilaterally, no wheezing, no crackles. Normal respiratory effort. No accessory muscle use.  Cardiovascular: Regular rate and rhythm, no murmurs / rubs / gallops. No  extremity edema. 2+ pedal pulses. No carotid bruits.  Abdomen: no tenderness, no masses palpated. No hepatosplenomegaly. Bowel sounds positive.  Musculoskeletal: no clubbing / cyanosis. No joint deformity upper and lower extremities. Good ROM, no contractures. Normal muscle tone.  Skin: no rashes, lesions, ulcers. No induration Neurologic: CN 2-12 grossly intact. Sensation intact, DTR normal. Strength 5/5 in all 4.  Psychiatric: Normal judgment and insight. Alert and oriented x 3. Normal mood.     Labs on Admission: I have personally reviewed following labs and imaging studies  CBC:  Recent Labs Lab 06/24/16 0009  WBC 14.2*  NEUTROABS 11.3*  HGB 14.3  HCT 40.8  MCV 94.9  PLT 638   Basic Metabolic Panel:  Recent Labs Lab 06/24/16 0009  NA 139  K 3.8  CL 110  CO2 21*  GLUCOSE 113*  BUN 18  CREATININE 1.83*  CALCIUM 9.0   GFR: Estimated Creatinine  Clearance: 29.5 mL/min (A) (by C-G formula based on SCr of 1.83 mg/dL (H)). Liver Function Tests:  Recent Labs Lab 06/24/16 0009  AST 49*  ALT 55*  ALKPHOS 76  BILITOT 1.0  PROT 6.6  ALBUMIN 3.8   No results for input(s): LIPASE, AMYLASE in the last 168 hours. No results for input(s): AMMONIA in the last 168 hours. Coagulation Profile: No results for input(s): INR, PROTIME in the last 168 hours. Cardiac Enzymes: No results for input(s): CKTOTAL, CKMB, CKMBINDEX, TROPONINI in the last 168 hours. BNP (last 3 results) No results for input(s): PROBNP in the last 8760 hours. HbA1C: No results for input(s): HGBA1C in the last 72 hours. CBG: No results for input(s): GLUCAP in the last 168 hours. Lipid Profile: No results for input(s): CHOL, HDL, LDLCALC, TRIG, CHOLHDL, LDLDIRECT in the last 72 hours. Thyroid Function Tests: No results for input(s): TSH, T4TOTAL, FREET4, T3FREE, THYROIDAB in the last 72 hours. Anemia Panel: No results for input(s): VITAMINB12, FOLATE, FERRITIN, TIBC, IRON, RETICCTPCT in the last 72  hours. Urine analysis: No results found for: COLORURINE, APPEARANCEUR, LABSPEC, PHURINE, GLUCOSEU, HGBUR, BILIRUBINUR, KETONESUR, PROTEINUR, UROBILINOGEN, NITRITE, LEUKOCYTESUR Sepsis Labs: No results found for this or any previous visit (from the past 240 hour(s)).   Radiological Exams on Admission: Ct Head Wo Contrast  Result Date: 06/23/2016 CLINICAL DATA:  Acute onset of seizure and syncope. Generalized weakness. Initial encounter. EXAM: CT HEAD WITHOUT CONTRAST TECHNIQUE: Contiguous axial images were obtained from the base of the skull through the vertex without intravenous contrast. COMPARISON:  CT of the head performed 05/05/2016 FINDINGS: Brain: No evidence of acute infarction, hemorrhage, hydrocephalus, extra-axial collection or mass lesion/mass effect. Prominence of the ventricles and sulci reflects mild cortical volume loss. Mild cerebellar atrophy is noted. A chronic infarct is noted at the left frontal lobe, with associated encephalomalacia. The brainstem and fourth ventricle are within normal limits. The basal ganglia are unremarkable in appearance. The cerebral hemispheres demonstrate grossly normal gray-white differentiation. No mass effect or midline shift is seen. Vascular: No hyperdense vessel or unexpected calcification. Skull: There is no evidence of fracture; visualized osseous structures are unremarkable in appearance. Sinuses/Orbits: The visualized portions of the orbits are within normal limits. The paranasal sinuses and mastoid air cells are well-aerated. Other: No significant soft tissue abnormalities are seen. IMPRESSION: 1. No acute intracranial pathology seen on CT. 2. Mild cortical volume loss. 3. 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 Electronically Signed   By: Garald Balding M.D.   On: 06/23/2016 23:18      Assessment/Plan Recurrent seizures: Patient has been having seizure-like activity for the last 2 months. Reported more  frequent episodes. Yesterday was first episode which patient loss consciousness. Neurology was notified and recommended starting patient on [place]. Patient has follow-up appointment with outpatient neurology in the coming weeks. - Admit to a telemetry bed - Seizure protocol initiated - Check CPK - Ativan prn seizures - Continue Keppra 500 mg twice a day - May want to formally consult neurology in a.m. for any other recommendations  Orthostatic hypotension: Acute. Secondary to dehydration possibly related to diarrhea. - IVF NS at 75 ml/hr - Recheck orthostatic vital signs in a.m. - May warrant stool studies if diarrhea persists  Leukocytosis: WBC elevated at 14.2. Suspect that this could be secondary to infection versus reactive following seizure. - Continue to monitor  Acute kidney injury:  Patient presents with creatinine 1.83 and BUN 18. Previous creatinine on care everywhere noted to  be 0.86 on 08/2015 - Continue IV fluids  - Follow-up urinalysis - Check Renal ultrasound  - Hold nephrotoxic agents   Essential hypertension - Hold lisinopril and metoprolol  CAD - Continue Plavix and aspirin  COPD, without acute exacerbation - Continue Spiriva and pharmacy substitution of Dulera - Albuterol nebs prn  Dyslipidemia - Continue Crestor and gemfibrozil   Hypothyroidism - Check TSH - Continue levothyroxine    Transaminitis : Mild elevation in AST and nail pitting.   Jerrye Bushy - Continued pharmacy substitutions of Pepcid and Protonix   DVT prophylaxis: Lovenox Code Status: Full  Family Communication: Discussed plan of care with patient and family present at bedside Disposition Plan: Possible discharge home 1-2 days Consults called: None  Admission status: Observation  Norval Morton MD [facility] Pager [phone]  If 7PM-7AM, please contact night-coverage [email or web address] Password [facility]  06/24/2016, 4:04 AM

## 2016-06-24 NOTE — Progress Notes (Signed)
MEDICATION RELATED CONSULT NOTE - INITIAL   Pharmacy Consult for keppra Indication: seizues  Allergies  Allergen Reactions  . Codeine     GI issues    Patient Measurements: Height: 5' (152.4 cm) Weight: 173 lb (78.5 kg) IBW/kg (Calculated) : 45.5 Adjusted Body Weight:   Vital Signs: Temp: 98.4 F (36.9 C) (04/14 0558) Temp Source: Oral (04/14 0558) BP: 112/76 (04/14 0558) Pulse Rate: 99 (04/14 0558) Intake/Output from previous day: No intake/output data recorded. Intake/Output from this shift: No intake/output data recorded.  Labs:  Recent Labs  06/24/16 0009  WBC 14.2*  HGB 14.3  HCT 40.8  PLT 244  CREATININE 1.83*  ALBUMIN 3.8  PROT 6.6  AST 49*  ALT 55*  ALKPHOS 76  BILITOT 1.0   Estimated Creatinine Clearance: 29.5 mL/min (A) (by C-G formula based on SCr of 1.83 mg/dL (H)).   Microbiology: No results found for this or any previous visit (from the past 720 hour(s)).  Medical History: Past Medical History:  Diagnosis Date  . Allergic rhinitis   . Asthma   . COPD (chronic obstructive pulmonary disease) ([place])   . Coronary artery disease   . History of hysterectomy   . Hyperthyroidism   . Seizures ([place])   . Stroke [facility])     Medications:  Facility-Administered Medications Prior to Admission  Medication Dose Route Frequency Provider Last Rate Last Dose  . ipratropium-albuterol (DUONEB) 0.5-2.5 (3) MG/3ML nebulizer solution 3 mL  3 mL Nebulization Once Shaylar Charmian Muff, MD       Prescriptions Prior to Admission  Medication Sig Dispense Refill Last Dose  . albuterol (PROAIR HFA) 108 (90 Base) MCG/ACT inhaler Inhale 2 puffs into the lungs every 4 (four) hours as needed for wheezing or shortness of breath. 1 Inhaler 1 unknown  . albuterol (PROVENTIL) (2.5 MG/3ML) 0.083% nebulizer solution Take 2.5 mg by nebulization every 4 (four) hours as needed for wheezing or shortness of breath.   unknown  . aspirin 81 MG EC tablet Take 81 mg by mouth  daily. Swallow whole.   06/23/2016 at Unknown time  . azelastine (ASTELIN) 0.1 % nasal spray Place 1 spray into both nostrils 2 (two) times daily. (Patient taking differently: Place 1 spray into both nostrils 2 (two) times daily as needed for rhinitis or allergies. ) 30 mL 5 unknown  . budesonide-formoterol (SYMBICORT) 160-4.5 MCG/ACT inhaler Inhale two puffs twice daily to prevent cough or wheeze 10.2 g 0 06/23/2016 at Unknown time  . citalopram (CELEXA) 20 MG tablet Take 20 mg by mouth daily.   06/23/2016 at Unknown time  . clopidogrel (PLAVIX) 75 MG tablet Take 75 mg by mouth daily.   06/23/2016 at Unknown time  . dextromethorphan-guaiFENesin (MUCINEX DM) 30-600 MG 12hr tablet Take 1 tablet by mouth 2 (two) times daily as needed for cough.   unknown  . gemfibrozil (LOPID) 600 MG tablet Take 600 mg by mouth 2 (two) times daily before a meal.   06/23/2016 at Unknown time  . levothyroxine (SYNTHROID, LEVOTHROID) 125 MCG tablet Take 125 mcg by mouth daily.   06/23/2016 at Unknown time  . levothyroxine (SYNTHROID, LEVOTHROID) 150 MCG tablet Take 150 mcg by mouth daily before breakfast.   06/23/2016 at Unknown time  . lisinopril (PRINIVIL,ZESTRIL) 10 MG tablet Take 10 mg by mouth daily.   06/23/2016 at Unknown time  . metoprolol tartrate (LOPRESSOR) 25 MG tablet Take 25 mg by mouth 2 (two) times daily.   06/23/2016 at Unknown time  . nitroGLYCERIN (  NITROSTAT) 0.4 MG SL tablet Place 0.4 mg under the tongue every 5 (five) minutes as needed for chest pain.   unknown  . omeprazole (PRILOSEC) 40 MG capsule TAKE ONE CAPSULE BY MOUTH IN THE MORNING BEFORE  BREAKFAST 30 capsule 3 06/23/2016 at Unknown time  . QVAR 80 MCG/ACT inhaler INHALE TWO PUFFS INTO THE LUNGS TWICE DAILY. 1 Inhaler 4 06/22/2016 at Unknown time  . ranitidine (ZANTAC) 300 MG tablet TAKE ONE TABLET BY MOUTH IN THE EVENING 30 tablet 5 06/22/2016  . rosuvastatin (CRESTOR) 40 MG tablet Take 40 mg by mouth daily.   06/23/2016 at Unknown time  . tiotropium  (SPIRIVA HANDIHALER) 18 MCG inhalation capsule Inhale the contents of one capsule once daily as directed. 30 capsule 3 06/23/2016 at Unknown time  . montelukast (SINGULAIR) 10 MG tablet Take 10 mg by mouth.   Taking   Scheduled:  . aspirin EC  81 mg Oral Daily  . budesonide (PULMICORT) nebulizer solution  0.25 mg Nebulization BID  . citalopram  20 mg Oral Daily  . clopidogrel  75 mg Oral Daily  . enoxaparin (LOVENOX) injection  30 mg Subcutaneous Q24H  . famotidine  40 mg Oral QHS  . gemfibrozil  600 mg Oral BID AC  . levETIRAcetam  500 mg Oral BID  . levothyroxine  125 mcg Oral QAC breakfast  . pantoprazole  40 mg Oral Daily  . tiotropium  18 mcg Inhalation Daily    Assessment: Patient with new seizures and started on keppra.    Goal of Therapy:  Safe and effective use of keppra  Plan:  No Drug interaction and monitoring of antiepileptic medications, noted on patient's profile at this time.  Tyler Deis, Shea Stakes [place] 06/24/2016,6:57 AM

## 2016-06-24 NOTE — ED Notes (Signed)
IV Team at bedside 

## 2016-06-24 NOTE — ED Notes (Signed)
IV Team unsuccessful PIV attempts. PA notified.

## 2016-06-25 DIAGNOSIS — N390 Urinary tract infection, site not specified: Secondary | ICD-10-CM

## 2016-06-25 LAB — URINE CULTURE: Culture: <10000 — AB

## 2016-06-25 LAB — BASIC METABOLIC PANEL
Anion gap: 6 (ref 5–15)
BUN: 13 mg/dL (ref 6–20)
CALCIUM: 8.3 mg/dL — AB (ref 8.9–10.3)
CHLORIDE: 116 mmol/L — AB (ref 101–111)
CO2: 17 mmol/L — ABNORMAL LOW (ref 22–32)
CREATININE: 1.46 mg/dL — AB (ref 0.44–1.00)
GFR calc non Af Amer: 37 mL/min — ABNORMAL LOW (ref >60)
GFR, EST AFRICAN AMERICAN: 43 mL/min — AB (ref >60)
Glucose, Bld: 89 mg/dL (ref 65–99)
Potassium: 4.4 mmol/L (ref 3.5–5.1)
SODIUM: 139 mmol/L (ref 135–145)

## 2016-06-25 LAB — T4, FREE: FREE T4: 0.36 ng/dL — AB (ref 0.61–1.12)

## 2016-06-25 LAB — MAGNESIUM: MAGNESIUM: 2 mg/dL (ref 1.7–2.4)

## 2016-06-25 MED ORDER — CEFTRIAXONE SODIUM 1 G IJ SOLR
1.0000 g | INTRAMUSCULAR | Status: DC
  Administered 2016-06-25 – 2016-06-26 (×2): 1 g via INTRAVENOUS
  Filled 2016-06-25 (×2): qty 10

## 2016-06-25 NOTE — Progress Notes (Signed)
PROGRESS NOTE  Bethany Sosa LKJ:[id number] DOB: [birth year] DOA: 06/23/2016 PCP: Janie Morning, DO  Brief History:  61 year old female with a history of coronary artery disease, COPD, hypothyroidism, hyperlipidemia presenting after seizure-like activity. The patient was getting up after using the commode when she felt right leg shaking that subsequently progressed up the rest of her body resulting in "whole body convulsing" witnessed by daughter. This resulted in loss of consciousness that lasted approximately 3 minutes. According to the family, the patient has had increasing episodes of these spells in the past week prior to admission.  The family also states that she has had these prior episodes where she is awake and aware for surroundings even as she has had "whole body convulsions".  There is no loss of bowel or bladder function. Patient is not better to him. The patient went to see her primary care provider on the morning prior to admission with same complaints. Referral was provided to see a neurologist in the outpatient setting. However, after the patient went home, she had the episode on the commode which prompted her to come to the emergency department. The patient denies any new medications and denies any illicit drug use or over-the-counter medications. According to medical records, the patient had similar complaints back in January 2018 when she went to see her primary care provider. Per Dr. Janie Morning' notes, it was felt these spells were related to muscle spasms. In the emergency department, the patient was noted to have WBC 14.2 and elevated serum creatinine of 1.83.  As result, the patient was admitted for further observation.The case was discussed with neurology, Dr. Leonel Ramsay who recommended Keppra load and continuing on maintenance Keppra and follow up with outpatient neurology.  Assessment/Plan: Seizure-like activity -MRI brain--ablation of left ACA infarct, no acute  infarct, chronic cervical bilateral internal carotid occlusion -Continue Keppra -Urine drug screen--neg  Acute kidney injury -Patient had normal serum creatinine dating back to 10/26/2011 -Renal ultrasound--no hydronephrosis, atrophic right kidney, neg GB -IV fluids -Urinalysis--TNTC WBC, 6-30 RBC -Holding lisinopril  Hypotension -Likely due to volume depletion -Improved with fluids  Hypertension -Holding lisinopril secondary to soft blood pressure -Holding metoprolol tartrate secondary to soft blood pressure  CAD -Continue aspirin and Plavix -Continue statin -No anginal symptoms  COPD -Patient quit smoking February 2018 -Stable presently -Continue LABA and Spiriva  Syncope -due to volume depletion -continue tele  Hypothyroidism -Continue Synthroid -Check TSH--61.286, Free T4 0.36 -pt not taking synthroid properly at home-->continue current dosing  Hyperlipidemia  -Continue gemfibrozil and statin   Leukocytosis -secondary to UTI -CXR--negative -UA--TNTC WBC -am CBC  UTI -start empiric ceftriaxone pending culture data   Disposition Plan:   Home 4/16 if stable  Family Communication:   Daughter and son updated at bedside 4/15--Total time spent 35 minutes.  Greater than 50% spent face to face counseling and coordinating care.   Consultants:  Neurology on phone  Code Status:  FULL   DVT Prophylaxis:  [place] Lovenox   Procedures: As Listed in Progress Note Above  Antibiotics: None   Subjective: Patient denies fevers, chills, headache, chest pain, dyspnea, nausea, vomiting, diarrhea, abdominal pain, dysuria, hematuria, hematochezia, and melena.   Objective: Vitals:   06/25/16 0624 06/25/16 0851 06/25/16 1516 06/25/16 1549  BP: 122/72  (!) 88/53 (!) 118/55  Pulse: 72 84 79 71  Resp:  18 18   Temp: 98 F (36.7 C)  98.9 F (37.2 C)   TempSrc: Oral  Oral   SpO2: 100% 97% 100%   Weight:      Height:        Intake/Output Summary  (Last 24 hours) at 06/25/16 1726 Last data filed at 06/25/16 1640  Gross per 24 hour  Intake             1825 ml  Output                0 ml  Net             1825 ml   Weight change:  Exam:   General:  Pt is alert, follows commands appropriately, not in acute distress  HEENT: No icterus, No thrush, No neck mass, [place]/AT  Cardiovascular: RRR, S1/S2, no rubs, no gallops  Respiratory: Bibasilar crackles. No wheeze. Good air movement  Abdomen: Soft/+BS, non tender, non distended, no guarding  Extremities: No edema, No lymphangitis, No petechiae, No rashes, no synovitis   Data Reviewed: I have personally reviewed following labs and imaging studies Basic Metabolic Panel:  Recent Labs Lab 06/24/16 0009 06/24/16 0626 06/25/16 0659  NA 139  --  139  K 3.8  --  4.4  CL 110  --  116*  CO2 21*  --  17*  GLUCOSE 113*  --  89  BUN 18  --  13  CREATININE 1.83*  --  1.46*  CALCIUM 9.0  --  8.3*  MG  --  2.1 2.0   Liver Function Tests:  Recent Labs Lab 06/24/16 0009  AST 49*  ALT 55*  ALKPHOS 76  BILITOT 1.0  PROT 6.6  ALBUMIN 3.8   No results for input(s): LIPASE, AMYLASE in the last 168 hours. No results for input(s): AMMONIA in the last 168 hours. Coagulation Profile: No results for input(s): INR, PROTIME in the last 168 hours. CBC:  Recent Labs Lab 06/24/16 0009  WBC 14.2*  NEUTROABS 11.3*  HGB 14.3  HCT 40.8  MCV 94.9  PLT 244   Cardiac Enzymes:  Recent Labs Lab 06/24/16 0626 06/24/16 0745  CKTOTAL 259*  --   TROPONINI  --  <0.03   BNP: Invalid input(s): POCBNP CBG: No results for input(s): GLUCAP in the last 168 hours. HbA1C: No results for input(s): HGBA1C in the last 72 hours. Urine analysis:    Component Value Date/Time   COLORURINE AMBER (A) 06/24/2016 1612   APPEARANCEUR CLOUDY (A) 06/24/2016 1612   LABSPEC 1.016 06/24/2016 1612   PHURINE 5.0 06/24/2016 1612   GLUCOSEU NEGATIVE 06/24/2016 1612   HGBUR MODERATE (A) 06/24/2016 1612     BILIRUBINUR NEGATIVE 06/24/2016 1612   KETONESUR NEGATIVE 06/24/2016 1612   PROTEINUR 30 (A) 06/24/2016 1612   NITRITE NEGATIVE 06/24/2016 1612   LEUKOCYTESUR MODERATE (A) 06/24/2016 1612   Sepsis Labs: @LABRCNTIP (procalcitonin:4,lacticidven:4) ) Recent Results (from the past 240 hour(s))  Culture, Urine     Status: Abnormal   Collection Time: 06/24/16  4:12 PM  Result Value Ref Range Status   Specimen Description URINE, RANDOM  Final   Special Requests NONE  Final   Culture (A)  Final    <10,000 COLONIES/mL INSIGNIFICANT GROWTH Performed at [facility], [place] [address] [place], [place] [address]    Report Status 06/25/2016 FINAL  Final     Scheduled Meds: . aspirin EC  81 mg Oral Daily  . cefTRIAXone (ROCEPHIN)  IV  1 g Intravenous Q24H  . citalopram  20 mg Oral Daily  . clopidogrel  75 mg Oral  Daily  . enoxaparin (LOVENOX) injection  30 mg Subcutaneous Q24H  . famotidine  20 mg Oral QHS  . gemfibrozil  600 mg Oral BID AC  . levETIRAcetam  500 mg Oral BID  . levothyroxine  125 mcg Oral QAC breakfast  . mometasone-formoterol  2 puff Inhalation BID  . pantoprazole  40 mg Oral Daily  . rosuvastatin  40 mg Oral Daily  . tiotropium  18 mcg Inhalation Daily   Continuous Infusions: . sodium chloride 75 mL/hr at 06/24/16 2338  . sodium chloride      Procedures/Studies: Dg Chest 2 View  Result Date: 06/24/2016 CLINICAL DATA:  61 year old female with history of shortness of breath. Leukocytosis. EXAM: CHEST  2 VIEW COMPARISON:  Chest x-ray a 05/05/2016. FINDINGS: Lung volumes are normal. No consolidative airspace disease. No pleural effusions. No pneumothorax. No pulmonary nodule or mass noted. Pulmonary vasculature and the cardiomediastinal silhouette are within normal limits. Aortic atherosclerosis. Status post median sternotomy for CABG. IMPRESSION: 1. No radiographic evidence of acute cardiopulmonary disease. 2. Aortic atherosclerosis. Electronically Signed   By:  Vinnie Langton M.D.   On: 06/24/2016 10:08   Ct Head Wo Contrast  Result Date: 06/23/2016 CLINICAL DATA:  Acute onset of seizure and syncope. Generalized weakness. Initial encounter. EXAM: CT HEAD WITHOUT CONTRAST TECHNIQUE: Contiguous axial images were obtained from the base of the skull through the vertex without intravenous contrast. COMPARISON:  CT of the head performed 05/05/2016 FINDINGS: Brain: No evidence of acute infarction, hemorrhage, hydrocephalus, extra-axial collection or mass lesion/mass effect. Prominence of the ventricles and sulci reflects mild cortical volume loss. Mild cerebellar atrophy is noted. A chronic infarct is noted at the left frontal lobe, with associated encephalomalacia. The brainstem and fourth ventricle are within normal limits. The basal ganglia are unremarkable in appearance. The cerebral hemispheres demonstrate grossly normal gray-white differentiation. No mass effect or midline shift is seen. Vascular: No hyperdense vessel or unexpected calcification. Skull: There is no evidence of fracture; visualized osseous structures are unremarkable in appearance. Sinuses/Orbits: The visualized portions of the orbits are within normal limits. The paranasal sinuses and mastoid air cells are well-aerated. Other: No significant soft tissue abnormalities are seen. IMPRESSION: 1. No acute intracranial pathology seen on CT. 2. Mild cortical volume loss. 3. 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 Electronically Signed   By: Garald Balding M.D.   On: 06/23/2016 23:18   Mr Brain Wo Contrast  Result Date: 06/24/2016 CLINICAL DATA:  Witnessed seizure while attempting to have a bowel movement on the bedside commode. EXAM: MRI HEAD WITHOUT CONTRAST TECHNIQUE: Multiplanar, multiecho pulse sequences of the brain and surrounding structures were obtained without intravenous contrast. COMPARISON:  MRI brain 05/02/2016.  CT head 06/23/2016. FINDINGS: Brain: The  recent left ACA territory infarct continues to show some restricted diffusion extending to the level of the anterior genu of the corpus callosum. No new infarcts are present. No acute hemorrhage or mass lesion is present. T2 changes are associated with the recent infarct. There is volume loss since the prior MRI. Mild periventricular T2 changes are noted otherwise. Temporal lobes are within normal limits bilaterally. The brainstem and cerebellum is normal. The internal auditory canals are unremarkable. Vascular: Bilateral cervical internal carotid artery occlusions are again noted through the cavernous segments. Vessels are reconstituted. ACA and MCA flow is evident bilaterally. Flow is present in the posterior circulation. Skull and upper cervical spine: The skullbase is within normal limits. The craniocervical junction is normal. Midline sagittal  structures are unremarkable. Sinuses/Orbits: The paranasal sinuses an mastoid air cells are clear. The globes and orbits are unremarkable. IMPRESSION: 1. Continued evolution of left ACA territory infarct. This could serve as a seizure focus. 2. Mild white matter disease otherwise. 3. No other acute or focal intracranial abnormality to explain patient's seizure. 4. Chronic carotid artery occlusions with reconstitution at the cavernous segments. Electronically Signed   By: San Morelle M.D.   On: 06/24/2016 09:18   US Abdomen Complete  Result Date: 06/24/2016 CLINICAL DATA:  Acute kidney insufficiency, increased transaminases EXAM: ABDOMEN ULTRASOUND COMPLETE COMPARISON:  CT scan 04/28/2016 FINDINGS: Gallbladder: No gallstones or wall thickening visualized. No sonographic Murphy sign noted by sonographer. No pericholecystic fluid Common bile duct: Diameter: 4 mm in diameter within normal limits Liver: No focal lesion identified. Within normal limits in parenchymal echogenicity. IVC: No abnormality visualized. Intrahepatic IVC not seen due to bowel gas Pancreas:  Visualized portion unremarkable. Spleen: Size and appearance within normal limits. Measures 7.3 cm in length Right Kidney: Length: 7.5 cm. Mild increased echogenicity and thinning of parenchyma probable due to atrophy. No hydronephrosis or renal calculi Left Kidney: Length: 12 cm in length. Echogenicity within normal limits. No mass or hydronephrosis visualized. Abdominal aorta: No aneurysm visualized. Measures up to 2.1 cm in diameter. Aortic bifurcation is not visualized due to abundant bowel gas Other findings: None. IMPRESSION: 1. No gallstones are noted within gallbladder.  Normal CBD. 2. Normal liver echogenicity.  No focal hepatic mass. 3. Atrophic right kidney with cortical thinning and mild increased echogenicity. No hydronephrosis or renal calculi bilaterally. 4. No aortic aneurysm. Electronically Signed   By: Lahoma Crocker M.D.   On: 06/24/2016 11:05    Dmoni Fortson, DO  [facility] Pager [phone]  If 7PM-7AM, please contact night-coverage [email or web address] Password [email or web address] 06/25/2016, 5:26 PM   LOS: 1 day

## 2016-06-26 ENCOUNTER — Other Ambulatory Visit (HOSPITAL_COMMUNITY): Payer: Medicaid Other

## 2016-06-26 DIAGNOSIS — R319 Hematuria, unspecified: Secondary | ICD-10-CM

## 2016-06-26 DIAGNOSIS — N390 Urinary tract infection, site not specified: Secondary | ICD-10-CM

## 2016-06-26 LAB — BASIC METABOLIC PANEL
ANION GAP: 6 (ref 5–15)
BUN: 9 mg/dL (ref 6–20)
CO2: 17 mmol/L — ABNORMAL LOW (ref 22–32)
Calcium: 8.1 mg/dL — ABNORMAL LOW (ref 8.9–10.3)
Chloride: 117 mmol/L — ABNORMAL HIGH (ref 101–111)
Creatinine, Ser: 1.44 mg/dL — ABNORMAL HIGH (ref 0.44–1.00)
GFR calc Af Amer: 44 mL/min — ABNORMAL LOW (ref >60)
GFR, EST NON AFRICAN AMERICAN: 38 mL/min — AB (ref >60)
Glucose, Bld: 88 mg/dL (ref 65–99)
POTASSIUM: 3.7 mmol/L (ref 3.5–5.1)
Sodium: 140 mmol/L (ref 135–145)

## 2016-06-26 LAB — CBC
HEMATOCRIT: 30 % — AB (ref 36.0–46.0)
HEMOGLOBIN: 10.2 g/dL — AB (ref 12.0–15.0)
MCH: 31.7 pg (ref 26.0–34.0)
MCHC: 34 g/dL (ref 30.0–36.0)
MCV: 93.2 fL (ref 78.0–100.0)
Platelets: 189 10*3/uL (ref 150–400)
RBC: 3.22 MIL/uL — ABNORMAL LOW (ref 3.87–5.11)
RDW: 13.2 % (ref 11.5–15.5)
WBC: 7.1 10*3/uL (ref 4.0–10.5)

## 2016-06-26 MED ORDER — CEFUROXIME AXETIL 250 MG PO TABS: 250.0000 mg | ORAL_TABLET | Freq: Two times a day (BID) | ORAL | 0 refills | Status: DC

## 2016-06-26 MED ORDER — FAMOTIDINE 20 MG PO TABS: 40.0000 mg | ORAL_TABLET | Freq: Every day | ORAL | Status: DC

## 2016-06-26 MED ORDER — LEVOTHYROXINE SODIUM 125 MCG PO TABS
125.0000 ug | ORAL_TABLET | Freq: Every day | ORAL | 0 refills | Status: DC
Start: 2016-06-27 — End: 2018-08-21

## 2016-06-26 MED ORDER — LEVETIRACETAM 500 MG PO TABS: 500.0000 mg | ORAL_TABLET | Freq: Two times a day (BID) | ORAL | 1 refills | Status: DC

## 2016-06-26 MED ORDER — METOPROLOL TARTRATE 25 MG PO TABS: 12.5000 mg | ORAL_TABLET | Freq: Two times a day (BID) | ORAL | 0 refills | Status: DC

## 2016-06-26 MED ORDER — ENOXAPARIN SODIUM 40 MG/0.4ML ~~LOC~~ SOLN
40.0000 mg | SUBCUTANEOUS | Status: DC
  Administered 2016-06-26: 40 mg via SUBCUTANEOUS
  Filled 2016-06-26: qty 0.4

## 2016-06-26 NOTE — Progress Notes (Signed)
Date: June 26, 2016 Discharge orders review for case management needs. [facility] notified of need for home p.t. Velva Harman, BSN, [place], CCM: [phone]

## 2016-06-26 NOTE — Discharge Summary (Addendum)
Physician Discharge Summary  HARLAN VINAL GHW:[id number] DOB: 09/27/1955 DOA: 06/23/2016  PCP: Janie Morning, DO  Admit date: 06/23/2016 Discharge date: 06/26/2016  Admitted From: Home Disposition:  Home   Recommendations for Outpatient Follow-up:  1. Follow up with PCP in 1-2 weeks 2. Please obtain BMP/CBC in one week   Home Health: YES Equipment/Devices HHPT  Discharge Condition: Stable CODE STATUS: FULL Diet recommendation: Heart Healthy   Brief/Interim Summary: 61 year old female with a history of coronary artery disease, COPD, hypothyroidism, hyperlipidemia presenting after seizure-like activity. The patient was getting up after using the commode when she felt right leg shaking that subsequently progressed up the rest of her body resulting in "whole body convulsing" witnessed by daughter. This resulted in loss of consciousness that lasted approximately 3 minutes. According to the family, the patient has had increasing episodes of these spells in the past week prior to admission.  The family also states that she has had these prior episodes where she is awake and aware for surroundings even as she has had "whole body convulsions".  There is no loss of bowel or bladder function. Patient is not better to him. The patient went to see her primary care provider on the morning prior to admission with same complaints. Referral was provided to see a neurologist in the outpatient setting. However, after the patient went home, she had the episode on the commode which prompted her to come to the emergency department. The patient denies any new medications and denies any illicit drug use or over-the-counter medications. According to medical records, the patient had similar complaints back in January 2018 when she went to see her primary care provider. Per Dr. Janie Morning' notes, it was felt these spells were related to muscle spasms. In the emergency department, the patient was noted to have WBC  14.2 and elevated serum creatinine of 1.83.  As result, the patient was admitted for further observation.The case was discussed with neurology, Dr. Leonel Ramsay who recommended Keppra load and continuing on maintenance Keppra and follow up with outpatient neurology.  Discharge Diagnoses:   Seizure-like activity as sequelae to stroke -MRI brain--ablation of left ACA infarct, no acute infarct, chronic cervical bilateral internal carotid occlusion -Continue Keppra -Urine drug screen--neg -outpt referral to Dr. Ellouise Newer -no further spells during the hospitalization  Acute kidney injury -Patient had normal serum creatinine dating back to 10/26/2011 -Renal ultrasound--no hydronephrosis, atrophic right kidney, neg GB -IV fluids -Urinalysis--TNTC WBC, 6-30 RBC -likely has component of CKD -serum creatinine 1.44 on day of dc -Holding lisinopril  Hypotension -Likely due to volume depletion -Improved with fluids  Hypertension -Holding lisinopril secondary to soft blood pressure -Holding metoprolol tartrate secondary to soft blood pressure -restart metoprolol at lower dose--12.5mg  bid in pt with hx of MI   CAD -Continue aspirin and Plavix -Continue statin -No anginal symptoms  COPD -Patient quit smoking February 2018 -Stable presently -Continue LABA and Spiriva  Syncope -due to volume depletion -continue tele--no concerning dysrhythmia  Hypothyroidism -Continue Synthroid -Check TSH--61.286, Free T4 0.36 -pt not taking synthroid properly at home-->continue current dosing at 125 mcg daily  Hyperlipidemia  -Continue gemfibrozil and statin   Leukocytosis -secondary to UTI -CXR--negative -UA--TNTC WBC -am CBC  UTI -started empiric ceftriaxone pending culture data--only 10K on culture -home with cefuroxime x 3 more days to complete 5 days of tx  Discharge Instructions  Discharge Instructions    Ambulatory referral to Neurology    Complete by:  As directed  Refer to [facility] for siezure   Diet - low sodium heart healthy    Complete by:  As directed    Increase activity slowly    Complete by:  As directed      Allergies as of 06/26/2016      Reactions   Codeine    GI issues      Medication List    STOP taking these medications   lisinopril 10 MG tablet Commonly known as:  PRINIVIL,ZESTRIL   montelukast 10 MG tablet Commonly known as:  SINGULAIR     TAKE these medications   albuterol (2.5 MG/3ML) 0.083% nebulizer solution Commonly known as:  PROVENTIL Take 2.5 mg by nebulization every 4 (four) hours as needed for wheezing or shortness of breath.   albuterol 108 (90 Base) MCG/ACT inhaler Commonly known as:  PROAIR HFA Inhale 2 puffs into the lungs every 4 (four) hours as needed for wheezing or shortness of breath.   aspirin 81 MG EC tablet Take 81 mg by mouth daily. Swallow whole.   azelastine 0.1 % nasal spray Commonly known as:  ASTELIN Place 1 spray into both nostrils 2 (two) times daily. What changed:  when to take this  reasons to take this   budesonide-formoterol 160-4.5 MCG/ACT inhaler Commonly known as:  SYMBICORT Inhale two puffs twice daily to prevent cough or wheeze   cefUROXime 250 MG tablet Commonly known as:  CEFTIN Take 1 tablet (250 mg total) by mouth 2 (two) times daily with a meal.   citalopram 20 MG tablet Commonly known as:  CELEXA Take 20 mg by mouth daily.   clopidogrel 75 MG tablet Commonly known as:  PLAVIX Take 75 mg by mouth daily.   dextromethorphan-guaiFENesin 30-600 MG 12hr tablet Commonly known as:  MUCINEX DM Take 1 tablet by mouth 2 (two) times daily as needed for cough.   gemfibrozil 600 MG tablet Commonly known as:  LOPID Take 600 mg by mouth 2 (two) times daily before a meal.   levETIRAcetam 500 MG tablet Commonly known as:  KEPPRA Take 1 tablet (500 mg total) by mouth 2 (two) times daily.   levothyroxine 125 MCG tablet Commonly known as:  SYNTHROID,  LEVOTHROID Take 1 tablet (125 mcg total) by mouth daily. Start taking on:  06/27/2016 What changed:  Another medication with the same name was removed. Continue taking this medication, and follow the directions you see here.   metoprolol tartrate 25 MG tablet Commonly known as:  LOPRESSOR Take 0.5 tablets (12.5 mg total) by mouth 2 (two) times daily. What changed:  how much to take   nitroGLYCERIN 0.4 MG SL tablet Commonly known as:  NITROSTAT Place 0.4 mg under the tongue every 5 (five) minutes as needed for chest pain.   omeprazole 40 MG capsule Commonly known as:  PRILOSEC TAKE ONE CAPSULE BY MOUTH IN THE MORNING BEFORE  BREAKFAST   QVAR 80 MCG/ACT inhaler Generic drug:  beclomethasone INHALE TWO PUFFS INTO THE LUNGS TWICE DAILY.   ranitidine 300 MG tablet Commonly known as:  ZANTAC TAKE ONE TABLET BY MOUTH IN THE EVENING   rosuvastatin 40 MG tablet Commonly known as:  CRESTOR Take 40 mg by mouth daily.   tiotropium 18 MCG inhalation capsule Commonly known as:  SPIRIVA HANDIHALER Inhale the contents of one capsule once daily as directed.       Allergies  Allergen Reactions  . Codeine     GI issues    Consultations:  none   Procedures/Studies:  Dg Chest 2 View  Result Date: 06/24/2016 CLINICAL DATA:  61 year old female with history of shortness of breath. Leukocytosis. EXAM: CHEST  2 VIEW COMPARISON:  Chest x-ray a 05/05/2016. FINDINGS: Lung volumes are normal. No consolidative airspace disease. No pleural effusions. No pneumothorax. No pulmonary nodule or mass noted. Pulmonary vasculature and the cardiomediastinal silhouette are within normal limits. Aortic atherosclerosis. Status post median sternotomy for CABG. IMPRESSION: 1. No radiographic evidence of acute cardiopulmonary disease. 2. Aortic atherosclerosis. Electronically Signed   By: Vinnie Langton M.D.   On: 06/24/2016 10:08   Ct Head Wo Contrast  Result Date: 06/23/2016 CLINICAL DATA:  Acute onset of  seizure and syncope. Generalized weakness. Initial encounter. EXAM: CT HEAD WITHOUT CONTRAST TECHNIQUE: Contiguous axial images were obtained from the base of the skull through the vertex without intravenous contrast. COMPARISON:  CT of the head performed 05/05/2016 FINDINGS: Brain: No evidence of acute infarction, hemorrhage, hydrocephalus, extra-axial collection or mass lesion/mass effect. Prominence of the ventricles and sulci reflects mild cortical volume loss. Mild cerebellar atrophy is noted. A chronic infarct is noted at the left frontal lobe, with associated encephalomalacia. The brainstem and fourth ventricle are within normal limits. The basal ganglia are unremarkable in appearance. The cerebral hemispheres demonstrate grossly normal gray-white differentiation. No mass effect or midline shift is seen. Vascular: No hyperdense vessel or unexpected calcification. Skull: There is no evidence of fracture; visualized osseous structures are unremarkable in appearance. Sinuses/Orbits: The visualized portions of the orbits are within normal limits. The paranasal sinuses and mastoid air cells are well-aerated. Other: No significant soft tissue abnormalities are seen. IMPRESSION: 1. No acute intracranial pathology seen on CT. 2. Mild cortical volume loss. 3. 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 Electronically Signed   By: Garald Balding M.D.   On: 06/23/2016 23:18   Mr Brain Wo Contrast  Result Date: 06/24/2016 CLINICAL DATA:  Witnessed seizure while attempting to have a bowel movement on the bedside commode. EXAM: MRI HEAD WITHOUT CONTRAST TECHNIQUE: Multiplanar, multiecho pulse sequences of the brain and surrounding structures were obtained without intravenous contrast. COMPARISON:  MRI brain 05/02/2016.  CT head 06/23/2016. FINDINGS: Brain: The recent left ACA territory infarct continues to show some restricted diffusion extending to the level of the anterior genu of the  corpus callosum. No new infarcts are present. No acute hemorrhage or mass lesion is present. T2 changes are associated with the recent infarct. There is volume loss since the prior MRI. Mild periventricular T2 changes are noted otherwise. Temporal lobes are within normal limits bilaterally. The brainstem and cerebellum is normal. The internal auditory canals are unremarkable. Vascular: Bilateral cervical internal carotid artery occlusions are again noted through the cavernous segments. Vessels are reconstituted. ACA and MCA flow is evident bilaterally. Flow is present in the posterior circulation. Skull and upper cervical spine: The skullbase is within normal limits. The craniocervical junction is normal. Midline sagittal structures are unremarkable. Sinuses/Orbits: The paranasal sinuses an mastoid air cells are clear. The globes and orbits are unremarkable. IMPRESSION: 1. Continued evolution of left ACA territory infarct. This could serve as a seizure focus. 2. Mild white matter disease otherwise. 3. No other acute or focal intracranial abnormality to explain patient's seizure. 4. Chronic carotid artery occlusions with reconstitution at the cavernous segments. Electronically Signed   By: San Morelle M.D.   On: 06/24/2016 09:18   US Abdomen Complete  Result Date: 06/24/2016 CLINICAL DATA:  Acute kidney insufficiency, increased transaminases EXAM: ABDOMEN ULTRASOUND COMPLETE  COMPARISON:  CT scan 04/28/2016 FINDINGS: Gallbladder: No gallstones or wall thickening visualized. No sonographic Murphy sign noted by sonographer. No pericholecystic fluid Common bile duct: Diameter: 4 mm in diameter within normal limits Liver: No focal lesion identified. Within normal limits in parenchymal echogenicity. IVC: No abnormality visualized. Intrahepatic IVC not seen due to bowel gas Pancreas: Visualized portion unremarkable. Spleen: Size and appearance within normal limits. Measures 7.3 cm in length Right Kidney:  Length: 7.5 cm. Mild increased echogenicity and thinning of parenchyma probable due to atrophy. No hydronephrosis or renal calculi Left Kidney: Length: 12 cm in length. Echogenicity within normal limits. No mass or hydronephrosis visualized. Abdominal aorta: No aneurysm visualized. Measures up to 2.1 cm in diameter. Aortic bifurcation is not visualized due to abundant bowel gas Other findings: None. IMPRESSION: 1. No gallstones are noted within gallbladder.  Normal CBD. 2. Normal liver echogenicity.  No focal hepatic mass. 3. Atrophic right kidney with cortical thinning and mild increased echogenicity. No hydronephrosis or renal calculi bilaterally. 4. No aortic aneurysm. Electronically Signed   By: Lahoma Crocker M.D.   On: 06/24/2016 11:05         Discharge Exam: Vitals:   06/25/16 2147 06/26/16 0550  BP: 104/60 (!) 132/45  Pulse: (!) 105 87  Resp: 18 18  Temp: 98 F (36.7 C) 98 F (36.7 C)   Vitals:   06/25/16 2006 06/25/16 2147 06/26/16 0550 06/26/16 0919  BP:  104/60 (!) 132/45   Pulse:  (!) 105 87   Resp:  18 18   Temp:  98 F (36.7 C) 98 F (36.7 C)   TempSrc:  Oral Oral   SpO2: 93% 99% 99% 96%  Weight:      Height:        General: Pt is alert, awake, not in acute distress Cardiovascular: RRR, S1/S2 +, no rubs, no gallops Respiratory: CTA bilaterally, no wheezing, no rhonchi Abdominal: Soft, NT, ND, bowel sounds + Extremities: no edema, no cyanosis   The results of significant diagnostics from this hospitalization (including imaging, microbiology, ancillary and laboratory) are listed below for reference.    Significant Diagnostic Studies: Dg Chest 2 View  Result Date: 06/24/2016 CLINICAL DATA:  61 year old female with history of shortness of breath. Leukocytosis. EXAM: CHEST  2 VIEW COMPARISON:  Chest x-ray a 05/05/2016. FINDINGS: Lung volumes are normal. No consolidative airspace disease. No pleural effusions. No pneumothorax. No pulmonary nodule or mass noted.  Pulmonary vasculature and the cardiomediastinal silhouette are within normal limits. Aortic atherosclerosis. Status post median sternotomy for CABG. IMPRESSION: 1. No radiographic evidence of acute cardiopulmonary disease. 2. Aortic atherosclerosis. Electronically Signed   By: Vinnie Langton M.D.   On: 06/24/2016 10:08   Ct Head Wo Contrast  Result Date: 06/23/2016 CLINICAL DATA:  Acute onset of seizure and syncope. Generalized weakness. Initial encounter. EXAM: CT HEAD WITHOUT CONTRAST TECHNIQUE: Contiguous axial images were obtained from the base of the skull through the vertex without intravenous contrast. COMPARISON:  CT of the head performed 05/05/2016 FINDINGS: Brain: No evidence of acute infarction, hemorrhage, hydrocephalus, extra-axial collection or mass lesion/mass effect. Prominence of the ventricles and sulci reflects mild cortical volume loss. Mild cerebellar atrophy is noted. A chronic infarct is noted at the left frontal lobe, with associated encephalomalacia. The brainstem and fourth ventricle are within normal limits. The basal ganglia are unremarkable in appearance. The cerebral hemispheres demonstrate grossly normal gray-white differentiation. No mass effect or midline shift is seen. Vascular: No hyperdense vessel or unexpected calcification.  Skull: There is no evidence of fracture; visualized osseous structures are unremarkable in appearance. Sinuses/Orbits: The visualized portions of the orbits are within normal limits. The paranasal sinuses and mastoid air cells are well-aerated. Other: No significant soft tissue abnormalities are seen. IMPRESSION: 1. No acute intracranial pathology seen on CT. 2. Mild cortical volume loss. 3. 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 Electronically Signed   By: Garald Balding M.D.   On: 06/23/2016 23:18   Mr Brain Wo Contrast  Result Date: 06/24/2016 CLINICAL DATA:  Witnessed seizure while attempting to have a bowel  movement on the bedside commode. EXAM: MRI HEAD WITHOUT CONTRAST TECHNIQUE: Multiplanar, multiecho pulse sequences of the brain and surrounding structures were obtained without intravenous contrast. COMPARISON:  MRI brain 05/02/2016.  CT head 06/23/2016. FINDINGS: Brain: The recent left ACA territory infarct continues to show some restricted diffusion extending to the level of the anterior genu of the corpus callosum. No new infarcts are present. No acute hemorrhage or mass lesion is present. T2 changes are associated with the recent infarct. There is volume loss since the prior MRI. Mild periventricular T2 changes are noted otherwise. Temporal lobes are within normal limits bilaterally. The brainstem and cerebellum is normal. The internal auditory canals are unremarkable. Vascular: Bilateral cervical internal carotid artery occlusions are again noted through the cavernous segments. Vessels are reconstituted. ACA and MCA flow is evident bilaterally. Flow is present in the posterior circulation. Skull and upper cervical spine: The skullbase is within normal limits. The craniocervical junction is normal. Midline sagittal structures are unremarkable. Sinuses/Orbits: The paranasal sinuses an mastoid air cells are clear. The globes and orbits are unremarkable. IMPRESSION: 1. Continued evolution of left ACA territory infarct. This could serve as a seizure focus. 2. Mild white matter disease otherwise. 3. No other acute or focal intracranial abnormality to explain patient's seizure. 4. Chronic carotid artery occlusions with reconstitution at the cavernous segments. Electronically Signed   By: San Morelle M.D.   On: 06/24/2016 09:18   US Abdomen Complete  Result Date: 06/24/2016 CLINICAL DATA:  Acute kidney insufficiency, increased transaminases EXAM: ABDOMEN ULTRASOUND COMPLETE COMPARISON:  CT scan 04/28/2016 FINDINGS: Gallbladder: No gallstones or wall thickening visualized. No sonographic Murphy sign noted  by sonographer. No pericholecystic fluid Common bile duct: Diameter: 4 mm in diameter within normal limits Liver: No focal lesion identified. Within normal limits in parenchymal echogenicity. IVC: No abnormality visualized. Intrahepatic IVC not seen due to bowel gas Pancreas: Visualized portion unremarkable. Spleen: Size and appearance within normal limits. Measures 7.3 cm in length Right Kidney: Length: 7.5 cm. Mild increased echogenicity and thinning of parenchyma probable due to atrophy. No hydronephrosis or renal calculi Left Kidney: Length: 12 cm in length. Echogenicity within normal limits. No mass or hydronephrosis visualized. Abdominal aorta: No aneurysm visualized. Measures up to 2.1 cm in diameter. Aortic bifurcation is not visualized due to abundant bowel gas Other findings: None. IMPRESSION: 1. No gallstones are noted within gallbladder.  Normal CBD. 2. Normal liver echogenicity.  No focal hepatic mass. 3. Atrophic right kidney with cortical thinning and mild increased echogenicity. No hydronephrosis or renal calculi bilaterally. 4. No aortic aneurysm. Electronically Signed   By: Lahoma Crocker M.D.   On: 06/24/2016 11:05     Microbiology: Recent Results (from the past 240 hour(s))  Culture, Urine     Status: Abnormal   Collection Time: 06/24/16  4:12 PM  Result Value Ref Range Status   Specimen Description URINE, RANDOM  Final   Special Requests NONE  Final   Culture (A)  Final    <10,000 COLONIES/mL INSIGNIFICANT GROWTH Performed at [facility], [place] [address], [place], [place] [address]    Report Status 06/25/2016 FINAL  Final     Labs: Basic Metabolic Panel:  Recent Labs Lab 06/24/16 0009 06/24/16 0626 06/25/16 0659 06/26/16 0539  NA 139  --  139 140  K 3.8  --  4.4 3.7  CL 110  --  116* 117*  CO2 21*  --  17* 17*  GLUCOSE 113*  --  89 88  BUN 18  --  13 9  CREATININE 1.83*  --  1.46* 1.44*  CALCIUM 9.0  --  8.3* 8.1*  MG  --  2.1 2.0  --    Liver Function  Tests:  Recent Labs Lab 06/24/16 0009  AST 49*  ALT 55*  ALKPHOS 76  BILITOT 1.0  PROT 6.6  ALBUMIN 3.8   No results for input(s): LIPASE, AMYLASE in the last 168 hours. No results for input(s): AMMONIA in the last 168 hours. CBC:  Recent Labs Lab 06/24/16 0009 06/26/16 0539  WBC 14.2* 7.1  NEUTROABS 11.3*  --   HGB 14.3 10.2*  HCT 40.8 30.0*  MCV 94.9 93.2  PLT 244 189   Cardiac Enzymes:  Recent Labs Lab 06/24/16 0626 06/24/16 0745  CKTOTAL 259*  --   TROPONINI  --  <0.03   BNP: Invalid input(s): POCBNP CBG: No results for input(s): GLUCAP in the last 168 hours.  Time coordinating discharge:  Greater than 30 minutes  Signed:  Demonie Kassa, DO [facility] Pager: [phone] 06/26/2016, 12:06 PM

## 2016-06-26 NOTE — Progress Notes (Addendum)
[id number] Per Joelene Millin with [facility] per medicaid glines no benefits for home pt.  Dr. Carles Collet notified./Yakira Duquette,BSN,RN3,CCM.

## 2016-06-27 ENCOUNTER — Encounter: Payer: Self-pay | Admitting: Neurology

## 2016-08-14 ENCOUNTER — Encounter: Payer: Self-pay | Admitting: Neurology

## 2016-08-14 ENCOUNTER — Ambulatory Visit (INDEPENDENT_AMBULATORY_CARE_PROVIDER_SITE_OTHER): Payer: Medicaid Other | Admitting: Neurology

## 2016-08-14 VITALS — BP 124/78 | HR 57 | Ht 60.0 in | Wt 165.0 lb

## 2016-08-14 DIAGNOSIS — Z8673 Personal history of transient ischemic attack (TIA), and cerebral infarction without residual deficits: Secondary | ICD-10-CM

## 2016-08-14 DIAGNOSIS — G40209 Localization-related (focal) (partial) symptomatic epilepsy and epileptic syndromes with complex partial seizures, not intractable, without status epilepticus: Secondary | ICD-10-CM

## 2016-08-14 MED ORDER — LEVETIRACETAM 500 MG PO TABS: 500.0000 mg | ORAL_TABLET | Freq: Two times a day (BID) | ORAL | 3 refills | Status: DC

## 2016-08-14 NOTE — Patient Instructions (Addendum)
1. Schedule routine EEG 2. Continue Keppra 500mg  twice a day 3. Continue all your medications 4. If symptoms change, go to ER immediately 5. Follow-up in 4 months, call for any changes  Seizure Precautions: 1. If medication has been prescribed for you to prevent seizures, take it exactly as directed.  Do not stop taking the medicine without talking to your doctor first, even if you have not had a seizure in a long time.   2. Avoid activities in which a seizure would cause danger to yourself or to others.  Don't operate dangerous machinery, swim alone, or climb in high or dangerous places, such as on ladders, roofs, or girders.  Do not drive unless your doctor says you may.  3. If you have any warning that you may have a seizure, lay down in a safe place where you can't hurt yourself.    4.  No driving for 6 months from last seizure, as per [facility].   Please refer to the following link on the [place] website for more information: http://[email or web address]/[email or web address]/[email or web address]   5.  Maintain good sleep hygiene. Avoid alcohol  6.  Contact your doctor if you have any problems that may be related to the medicine you are taking.  7.  Call 911 and bring the patient back to the ED if:        A.  The seizure lasts longer than 5 minutes.       B.  The patient doesn't awaken shortly after the seizure  C.  The patient has new problems such as difficulty seeing, speaking or moving  D.  The patient was injured during the seizure  E.  The patient has a temperature over 102 F (39C)  F.  The patient vomited and now is having trouble breathing

## 2016-08-14 NOTE — Progress Notes (Signed)
NEUROLOGY CONSULTATION NOTE  Bethany Sosa MRN: [id number] DOB: [birth year]  Referring provider: Dr. Shanon Brow Tat Primary care provider: Dr. Janie Morning  Reason for consult:  seizure  Dear Dr Tat:  Thank you for your kind referral of Bethany Sosa for consultation of the above symptoms. Although her history is well known to you, please allow me to reiterate it for the purpose of our medical record. The patient was accompanied to the clinic by her daughter and granddaughter who also provide collateral information. Records and images were personally reviewed where available.  HISTORY OF PRESENT ILLNESS: This is a pleasant 61 year old right-handed woman with a history of hyperlipidemia, CAD s/p CABG, bilateral carotid artery stenosis (?occlusion), left frontal stroke in February 2018, presenting for seizures. Her granddaughter reports the first episode occurred in January 2018, she witnessed upper body shaking with difficulty speaking lasting a few minutes, then she was back to baseline. On 04/22/16, she felt that "something was wrong with me." She denied any focal weakness. Per PCP notes, she was "saying weird off the wall and repeating herself." She was admitted to [facility] for 3 days where she saw Teleneurology and was diagnosed with a stroke. She was reported to have complete blockage of both carotid arteries. Her family reports that after the stroke, she started having episodes of her right leg giving out on her. One time in March while at the store, she was using her walker and told her daughter she needed to sit down. She apparently passed out and slumped to her right side, unconscious for a couple of seconds. No shaking noted by daughter. Her granddaughter reports an episode at the kitchen table where she had shaking of the right leg and arm for a split second. She felt this coming on, like she could not work her leg. Her granddaughter reported she would not talk until the shaking  stopped, then denied any post-event weakness or confusion. It appears she would have these right-sided episodes every couple of weeks. No associated tongue bite or incontinence. The last one occurred 06/23/16, she was not feeling good that day and was helped to the bedside commode. She used the commode then her head went back and she passed out for a few seconds. When she came to, EMS was around her. She did not recall having any focal weakness. She had an MRI without contrast on 06/24/16 which I personally reviewed, there was some continued restricted diffusion in the left ACA territory similar to prior MRI in February, extending to the level of the anterior genu of the corpus callosum. No new infarcts. The temporal lobes were symmetric. She was discharged home on Keppra 500mg  BID. Since then, her family denies any further right-sided symptoms or loss of consciousness. She denies any side effects on the Keppra. She denies any olfactory/gustatory hallucinations, deja vu, rising epigastric sensation, gaps in time, staring/unresponsive episodes. She had memory changes after the stroke and reports there are still some bits and pieces that she cannot remember. Prior to the stroke, she was driving without difficulties, no missed bills payments (her daughter has been in charge of finances since the stroke). Her daughter has been helping with medications since the stroke, prior to this she was forgetting every now and then.   She denies any headaches, dizziness, diplopia, dysarthria/dysphagia, neck/back pain, bowel/bladder dysfunction. It appears she was complaining of cough in September 2017 and had a chest xray which showed mild compression deformities in the mid-thoracic spine of  indeterminate age. She was reporting back pain to her PCP and was referred to Ortho. She currently denies any back pain. She lives with her children. Her father had Alzheimer's disease. She denies any history of alcohol use.  She had a normal  birth and early development.  There is no history of febrile convulsions, CNS infections such as meningitis/encephalitis, significant traumatic brain injury, neurosurgical procedures, or family history of seizures.  PAST MEDICAL HISTORY: Past Medical History:  Diagnosis Date  . Allergic rhinitis   . Asthma   . COPD (chronic obstructive pulmonary disease) ([place])   . Coronary artery disease   . History of hysterectomy   . Hyperthyroidism   . Seizures ([place])   . Stroke [facility])     PAST SURGICAL HISTORY: Past Surgical History:  Procedure Laterality Date  . CORONARY ARTERY BYPASS GRAFT    . open heart surgery      MEDICATIONS: Current Outpatient Prescriptions on File Prior to Visit  Medication Sig Dispense Refill  . albuterol (PROAIR HFA) 108 (90 Base) MCG/ACT inhaler Inhale 2 puffs into the lungs every 4 (four) hours as needed for wheezing or shortness of breath. 1 Inhaler 1  . albuterol (PROVENTIL) (2.5 MG/3ML) 0.083% nebulizer solution Take 2.5 mg by nebulization every 4 (four) hours as needed for wheezing or shortness of breath.    Marland Kitchen aspirin 81 MG EC tablet Take 81 mg by mouth daily. Swallow whole.    Marland Kitchen azelastine (ASTELIN) 0.1 % nasal spray Place 1 spray into both nostrils 2 (two) times daily. (Patient taking differently: Place 1 spray into both nostrils 2 (two) times daily as needed for rhinitis or allergies. ) 30 mL 5  . budesonide-formoterol (SYMBICORT) 160-4.5 MCG/ACT inhaler Inhale two puffs twice daily to prevent cough or wheeze 10.2 g 0  . cefUROXime (CEFTIN) 250 MG tablet Take 1 tablet (250 mg total) by mouth 2 (two) times daily with a meal. 6 tablet 0  . citalopram (CELEXA) 20 MG tablet Take 20 mg by mouth daily.    . clopidogrel (PLAVIX) 75 MG tablet Take 75 mg by mouth daily.    Marland Kitchen dextromethorphan-guaiFENesin (MUCINEX DM) 30-600 MG 12hr tablet Take 1 tablet by mouth 2 (two) times daily as needed for cough.    Marland Kitchen gemfibrozil (LOPID) 600 MG tablet Take 600 mg by mouth 2 (two)  times daily before a meal.    . levETIRAcetam (KEPPRA) 500 MG tablet Take 1 tablet (500 mg total) by mouth 2 (two) times daily. 60 tablet 1  . levothyroxine (SYNTHROID, LEVOTHROID) 125 MCG tablet Take 1 tablet (125 mcg total) by mouth daily. 30 tablet 0  . metoprolol tartrate (LOPRESSOR) 25 MG tablet Take 0.5 tablets (12.5 mg total) by mouth 2 (two) times daily. 60 tablet 0  . nitroGLYCERIN (NITROSTAT) 0.4 MG SL tablet Place 0.4 mg under the tongue every 5 (five) minutes as needed for chest pain.    Marland Kitchen omeprazole (PRILOSEC) 40 MG capsule TAKE ONE CAPSULE BY MOUTH IN THE MORNING BEFORE  BREAKFAST 30 capsule 3  . QVAR 80 MCG/ACT inhaler INHALE TWO PUFFS INTO THE LUNGS TWICE DAILY. 1 Inhaler 4  . ranitidine (ZANTAC) 300 MG tablet TAKE ONE TABLET BY MOUTH IN THE EVENING 30 tablet 5  . rosuvastatin (CRESTOR) 40 MG tablet Take 40 mg by mouth daily.    Marland Kitchen tiotropium (SPIRIVA HANDIHALER) 18 MCG inhalation capsule Inhale the contents of one capsule once daily as directed. 30 capsule 3   Current Facility-Administered Medications on File Prior  to Visit  Medication Dose Route Frequency Provider Last Rate Last Dose  . ipratropium-albuterol (DUONEB) 0.5-2.5 (3) MG/3ML nebulizer solution 3 mL  3 mL Nebulization Once Kennith Gain, MD        ALLERGIES: Allergies  Allergen Reactions  . Codeine     GI issues    FAMILY HISTORY: Family History  Problem Relation Age of Onset  . Asthma Father     SOCIAL HISTORY: Social History   Social History  . Marital status: Widowed    Spouse name: N/A  . Number of children: N/A  . Years of education: N/A   Occupational History  . Not on file.   Social History Main Topics  . Smoking status: Former Smoker    Types: Cigarettes  . Smokeless tobacco: Never Used  . Alcohol use No  . Drug use: No  . Sexual activity: Not on file   Other Topics Concern  . Not on file   Social History Narrative  . No narrative on file    REVIEW OF  SYSTEMS: Constitutional: No fevers, chills, or sweats, no generalized fatigue, change in appetite Eyes: No visual changes, double vision, eye pain Ear, nose and throat: No hearing loss, ear pain, nasal congestion, sore throat Cardiovascular: No chest pain, palpitations Respiratory:  No shortness of breath at rest or with exertion, wheezes GastrointestinaI: No nausea, vomiting, diarrhea, abdominal pain, fecal incontinence Genitourinary:  No dysuria, urinary retention or frequency Musculoskeletal:  No neck pain, back pain Integumentary: No rash, pruritus, skin lesions Neurological: as above Psychiatric: No depression, insomnia, anxiety Endocrine: No palpitations, fatigue, diaphoresis, mood swings, change in appetite, change in weight, increased thirst Hematologic/Lymphatic:  No anemia, purpura, petechiae. Allergic/Immunologic: no itchy/runny eyes, nasal congestion, recent allergic reactions, rashes  PHYSICAL EXAM: Vitals:   08/14/16 1423  BP: 124/78  Pulse: (!) 57   General: No acute distress Head:  Normocephalic/atraumatic Eyes: Fundoscopic exam shows bilateral sharp discs, no vessel changes, exudates, or hemorrhages Neck: supple, no paraspinal tenderness, full range of motion Back: No paraspinal tenderness Heart: regular rate and rhythm Lungs: Clear to auscultation bilaterally. Vascular: No carotid bruits. Skin/Extremities: No rash, no edema Neurological Exam: Mental status: alert and oriented to person, place, and time, no dysarthria or aphasia, Fund of knowledge is appropriate.  Recent and remote memory are intact. 3/3 delayed recall.  Attention and concentration are normal. Able to spell WORLD backwards.  Able to name objects and repeat phrases. Cranial nerves: CN I: not tested CN II: pupils equal, round and reactive to light, visual fields intact, fundi unremarkable. CN III, IV, VI:  full range of motion, no nystagmus, no ptosis CN V: facial sensation intact CN VII: upper  and lower face symmetric CN VIII: hearing intact to finger rub CN IX, X: gag intact, uvula midline CN XI: sternocleidomastoid and trapezius muscles intact CN XII: tongue midline Bulk & Tone: normal, no fasciculations. Motor: 5/5 throughout with no pronator drift. Sensation: intact to light touch, cold, pin, vibration and joint position sense.  No extinction to double simultaneous stimulation.  Romberg test negative Deep Tendon Reflexes: brisk +3 throughout, R>L, no ankle clonus, negative Hoffman sign Plantar responses: downgoing bilaterally Cerebellar: no incoordination on finger to nose, heel to shin. No dysdiadochokinesia Gait: narrow-based and steady, difficulty with tandem walk Tremor: none  IMPRESSION: This is a pleasant 61 year old right-handed woman with a history of hyperlipidemia, CAD s/p CABG, bilateral carotid artery stenosis (?occlusion), left ACA stroke in February 2018, presenting for seizures. The  patient describes a sensation of inability to control her right leg, with right leg and arm shaking. Family also describes episodes of loss of consciousness with no clear shaking. We discussed post-stroke seizures, however it appears she has been having spells even prior to the diagnosis of stroke in February. She does have bilateral carotid artery disease and may have been having limb-shaking TIAs seen with occlusive carotid disease, she is taking aspirin and Plavix, and is on statin. She has not had any more spells since starting Keppra 500mg  BID, no side effects. A routine EEG will be ordered. [facility] from [facility] will be requested for review. [place] driving laws were discussed with the patient, and she knows to stop driving after a seizure, until 6 months seizure-free. We discussed indications for going to the ER if she has another seizure, but she knows to go to the ER immediately for stroke-like symptoms. She will follow-up in 4 months and knows to call for any changes.   Thank  you for allowing me to participate in the care of this patient. Please do not hesitate to call for any questions or concerns.   Ellouise Newer, M.D.  CC: Dr. Theda Sers, Dr. Carles Collet

## 2016-08-15 ENCOUNTER — Encounter: Payer: Self-pay | Admitting: Neurology

## 2016-08-15 DIAGNOSIS — Z8673 Personal history of transient ischemic attack (TIA), and cerebral infarction without residual deficits: Secondary | ICD-10-CM | POA: Insufficient documentation

## 2016-08-15 DIAGNOSIS — G40209 Localization-related (focal) (partial) symptomatic epilepsy and epileptic syndromes with complex partial seizures, not intractable, without status epilepticus: Secondary | ICD-10-CM | POA: Insufficient documentation

## 2016-08-16 ENCOUNTER — Other Ambulatory Visit: Payer: Medicaid Other

## 2016-08-28 ENCOUNTER — Ambulatory Visit (INDEPENDENT_AMBULATORY_CARE_PROVIDER_SITE_OTHER): Payer: Medicaid Other | Admitting: Neurology

## 2016-08-28 DIAGNOSIS — G40209 Localization-related (focal) (partial) symptomatic epilepsy and epileptic syndromes with complex partial seizures, not intractable, without status epilepticus: Secondary | ICD-10-CM | POA: Diagnosis not present

## 2016-08-28 DIAGNOSIS — Z8673 Personal history of transient ischemic attack (TIA), and cerebral infarction without residual deficits: Secondary | ICD-10-CM

## 2016-09-08 NOTE — Procedures (Signed)
ELECTROENCEPHALOGRAM REPORT  Date of Study: 08/28/2016  Patient's Name: Bethany Sosa MRN: [id number] Date of Birth: 06-Aug-1955  Referring Provider: Dr. Ellouise Newer  Clinical History: This is a 61 year old woman with a history of left ACA stroke, with episodes of inability to control her right leg, right leg and arm shaking. She also has episodes of loss of consciousness without shaking activity.  Medications: Keppra PROAIR HFA  PROVENTIL  aspirin 81 MG ASTELIN  SYMBICORT CEFTIN CELEXA  PLAVIX MUCINEX DM LOPID   SYNTHROID, LEVOTHROID  LOPRESSOR NITROSTAT  PRILOSEC QVAR 55 MCG ZANTAC CRESTOR  [place]   Technical Summary: A multichannel digital EEG recording measured by the international 10-20 system with electrodes applied with paste and impedances below 5000 ohms performed in our laboratory with EKG monitoring in an awake and asleep patient.  Hyperventilation was not performed. Photic stimulation was performed.  The digital EEG was referentially recorded, reformatted, and digitally filtered in a variety of bipolar and referential montages for optimal display.    Description: The patient is awake and asleep during the recording.  During maximal wakefulness, there is a symmetric, medium voltage 8.5-9 Hz posterior dominant rhythm that attenuates with eye opening.  There is occasional focal theta and delta slowing over the left frontotemporal region.  During drowsiness and sleep, there is an increase in theta slowing of the background.  Vertex waves and symmetric sleep spindles were seen.  Photic stimulation did not elicit any abnormalities.  There were no epileptiform discharges or electrographic seizures seen.    EKG lead was unremarkable.  Impression: This awake and asleep EEG is abnormal due to occasional focal slowing over the left frontotemporal region.  Clinical Correlation of the above findings indicates focal cerebral dysfunction over the left  frontotemporal region suggestive of underlying structural or physiologic abnormality. The absence of epileptiform discharges does not exclude a clinical diagnosis of epilepsy. Clinical correlation is advised.   Ellouise Newer, M.D.

## 2016-09-11 ENCOUNTER — Other Ambulatory Visit: Payer: Self-pay | Admitting: *Deleted

## 2016-09-11 MED ORDER — OMEPRAZOLE 40 MG PO CPDR: DELAYED_RELEASE_CAPSULE | ORAL | 0 refills | Status: DC

## 2016-09-18 ENCOUNTER — Telehealth: Payer: Self-pay | Admitting: Neurology

## 2016-09-18 ENCOUNTER — Other Ambulatory Visit: Payer: Self-pay

## 2016-09-18 DIAGNOSIS — G40209 Localization-related (focal) (partial) symptomatic epilepsy and epileptic syndromes with complex partial seizures, not intractable, without status epilepticus: Secondary | ICD-10-CM

## 2016-09-18 MED ORDER — LEVETIRACETAM 500 MG PO TABS: ORAL_TABLET | ORAL | 3 refills | Status: DC

## 2016-09-18 NOTE — Telephone Encounter (Signed)
Caller: Patient's daughter  Urgent? Yes  Reason for the call: Bethany Sosa had 2 seizures yesterday and she was wanting to know should she be seen by Dr. Delice Lesch or have her medication increased? Please call her daughter. Thanks

## 2016-09-18 NOTE — Telephone Encounter (Signed)
Rx sent to pt's listed preferred pharmacy ([facility] in [place])

## 2016-09-18 NOTE — Telephone Encounter (Signed)
Spoke with pt daughter, [name], who states that pt had 2 seizures yesterday and is sleeping all day today.  This is pt's first seizure since February.  She also states that pt has not been drinking water like she should and that pt is most likely dehydrated.  She will not drink Gatorade or any other sports drinks.  I suggested maybe trying to get her to drink pedialite.  Daughter seemed agreeable to that.  [name] did state that pt missed 1 dose of her Keppra last week - Tuesday evening, but did take her Tuesday and Wednesday AM doses.  [name] is wondering if pt should come in to see Dr. Cecille Rubin or maybe up her Keppra?

## 2016-09-18 NOTE — Telephone Encounter (Signed)
Spoke with [name], relaying message below.  Calling Rx in now.

## 2016-09-18 NOTE — Telephone Encounter (Signed)
Increase Keppra 500mg : Take 1.5 tabs BID, pls send in new Rx. Follow-up as scheduled in 3 months, call for any changes. Thanks

## 2016-10-03 ENCOUNTER — Telehealth: Payer: Self-pay | Admitting: Neurology

## 2016-10-03 NOTE — Telephone Encounter (Signed)
Her EEG was on 08/28/16.

## 2016-10-03 NOTE — Telephone Encounter (Signed)
Calling regarding results from last visit. Please call. Thanks

## 2016-10-03 NOTE — Telephone Encounter (Signed)
Pls let her know the EEG did not show any seizure discharges. It showed changes reflecting her prior stroke. Continue Keppra 500mg  twice a day, call for any problems. Thanks

## 2016-10-04 NOTE — Telephone Encounter (Signed)
LMOM asking pt to return my call to relay message below

## 2016-10-04 NOTE — Telephone Encounter (Signed)
Pt called and was given Dr. Amparo Bristol message.  She will call with any problems.

## 2016-10-07 ENCOUNTER — Other Ambulatory Visit: Payer: Self-pay | Admitting: Allergy and Immunology

## 2016-10-09 ENCOUNTER — Other Ambulatory Visit: Payer: Self-pay | Admitting: *Deleted

## 2016-10-19 ENCOUNTER — Ambulatory Visit (INDEPENDENT_AMBULATORY_CARE_PROVIDER_SITE_OTHER): Payer: Medicaid Other | Admitting: Allergy and Immunology

## 2016-10-19 ENCOUNTER — Encounter: Payer: Self-pay | Admitting: Allergy and Immunology

## 2016-10-19 VITALS — BP 110/70 | HR 92 | Resp 20

## 2016-10-19 DIAGNOSIS — J3089 Other allergic rhinitis: Secondary | ICD-10-CM | POA: Diagnosis not present

## 2016-10-19 DIAGNOSIS — J45901 Unspecified asthma with (acute) exacerbation: Secondary | ICD-10-CM

## 2016-10-19 DIAGNOSIS — K219 Gastro-esophageal reflux disease without esophagitis: Secondary | ICD-10-CM | POA: Diagnosis not present

## 2016-10-19 DIAGNOSIS — J441 Chronic obstructive pulmonary disease with (acute) exacerbation: Secondary | ICD-10-CM | POA: Diagnosis not present

## 2016-10-19 MED ORDER — IPRATROPIUM-ALBUTEROL 0.5-2.5 (3) MG/3ML IN SOLN
3.0000 mL | Freq: Four times a day (QID) | RESPIRATORY_TRACT | Status: DC
Start: 2016-10-19 — End: 2017-07-06

## 2016-10-19 MED ORDER — METHYLPREDNISOLONE ACETATE 80 MG/ML IJ SUSP
80.0000 mg | Freq: Once | INTRAMUSCULAR | Status: AC
  Administered 2016-10-19: 80 mg via INTRAMUSCULAR

## 2016-10-19 MED ORDER — BECLOMETHASONE DIPROP HFA 80 MCG/ACT IN AERB: 2.0000 | INHALATION_SPRAY | Freq: Two times a day (BID) | RESPIRATORY_TRACT | 3 refills | Status: DC

## 2016-10-19 MED ORDER — BUDESONIDE-FORMOTEROL FUMARATE 160-4.5 MCG/ACT IN AERO: INHALATION_SPRAY | RESPIRATORY_TRACT | 0 refills | Status: DC

## 2016-10-19 NOTE — Patient Instructions (Signed)
  1. Continue Spiriva handihaler one inhaled capsule contents one time per day.  2. Restart Symbicort 160 - 2 inhalations 2 times per day with spacer  3. Add Qvar 80 REDIHALER  - 2 inhalations 2 times a day to Symbicort during 'flare up'  4. Continue Proair HFA and albuterol nebulization if needed.  5. Continue reflux treatment:   A. decrease daily caffeine and chocolate consumption  B. omeprazole 40 mg 1 time per day in morning  C. ranitidine 300 mg 1 time per day in evening  7. Continue OTC Nasacort / Rhinocort one spray each nostril 1 time per day  8. Can use over-the-counter antihistamine if needed  9. For this recent episode:   A. Depomedrol 80 IM delivered in clinic  B. Duoneb nebulization delivered in clinic  10. Return in 6 months or earlier if problem  11. Obtain fall flu vaccine

## 2016-10-19 NOTE — Progress Notes (Signed)
Follow-up Note  Referring Provider: Janie Morning, DO Primary Provider: Janie Morning, DO Date of Office Visit: 10/19/2016  Subjective:   Bethany Sosa (DOB: [birth year]) is a 61 y.o. female who returns to the [facility] on 10/19/2016 in re-evaluation of the following:  HPI: Bethany Sosa returns to this clinic in reevaluation of her COPD with component of asthma, allergic rhinitis, and LPR. I have not seen her in this clinic in over 6 months. She was last seen by Dr. Nelva Bush on January 2018 with an exacerbation of her lung condition requiring the administration of steroids.  She was doing very well without administration of steroids or antibiotics for her respiratory tract issue and for some reason tapered off her Symbicort over the course of the past 6 weeks or so and over the course of the past 2 weeks has redeveloped wheezing and shortness of breath and some nasal congestion as well. There has not been any fever or ugly sputum production or ugly nasal discharge or significant headaches or other respiratory tract symptoms. She has been using her short acting bronchodilator which does help her symptoms.  Her reflux is under excellent control as long as she continues on a proton pump inhibitor and an H2 receptor blocker.  She has had a little bit of raspy voice recently in association with her respiratory tract flare of the past 2 weeks but otherwise does quite well with her throat.  She apparently had a "stroke" in February 2018 and she has developed a seizure disorder. Her last seizure was about 2 and half to 3 weeks ago. There was no evidence with that seizure that she had developed an aspiration.  Allergies as of 10/19/2016      Reactions   Codeine    GI issues      Medication List      albuterol (2.5 MG/3ML) 0.083% nebulizer solution Commonly known as:  PROVENTIL Take 2.5 mg by nebulization every 4 (four) hours as needed for wheezing or shortness of breath.     albuterol 108 (90 Base) MCG/ACT inhaler Commonly known as:  PROAIR HFA Inhale 2 puffs into the lungs every 4 (four) hours as needed for wheezing or shortness of breath.   aspirin 81 MG EC tablet Take 81 mg by mouth daily. Swallow whole.   azelastine 0.1 % nasal spray Commonly known as:  ASTELIN Place 1 spray into both nostrils 2 (two) times daily.   beclomethasone 80 MCG/ACT inhaler Commonly known as:  QVAR REDIHALER Inhale 2 puffs into the lungs 2 (two) times daily.   budesonide-formoterol 160-4.5 MCG/ACT inhaler Commonly known as:  SYMBICORT Inhale two puffs twice daily to prevent cough or wheeze   citalopram 20 MG tablet Commonly known as:  CELEXA Take 20 mg by mouth daily.   clopidogrel 75 MG tablet Commonly known as:  PLAVIX Take 75 mg by mouth daily.   dextromethorphan-guaiFENesin 30-600 MG 12hr tablet Commonly known as:  MUCINEX DM Take 1 tablet by mouth 2 (two) times daily as needed for cough.   gemfibrozil 600 MG tablet Commonly known as:  LOPID Take 600 mg by mouth 2 (two) times daily before a meal.   levETIRAcetam 500 MG tablet Commonly known as:  KEPPRA Take 1 1/2 (one and a half) tablets by mouth twice daily   levothyroxine 125 MCG tablet Commonly known as:  SYNTHROID, LEVOTHROID Take 1 tablet (125 mcg total) by mouth daily.   metoprolol tartrate 25 MG tablet Commonly known as:  LOPRESSOR Take 0.5 tablets (12.5 mg total) by mouth 2 (two) times daily.   nitroGLYCERIN 0.4 MG SL tablet Commonly known as:  NITROSTAT Place 0.4 mg under the tongue every 5 (five) minutes as needed for chest pain.   omeprazole 40 MG capsule Commonly known as:  PRILOSEC TAKE ONE CAPSULE BY MOUTH ONCE DAILY IN THE MORNING BEFORE BREAKFAST   QVAR 80 MCG/ACT inhaler Generic drug:  beclomethasone INHALE TWO PUFFS INTO THE LUNGS TWICE DAILY.   ranitidine 300 MG tablet Commonly known as:  ZANTAC TAKE ONE TABLET BY MOUTH IN THE EVENING   rosuvastatin 40 MG  tablet Commonly known as:  CRESTOR Take 40 mg by mouth daily.   tiotropium 18 MCG inhalation capsule Commonly known as:  SPIRIVA HANDIHALER Inhale the contents of one capsule once daily as directed.       Past Medical History:  Diagnosis Date  . Allergic rhinitis   . Asthma   . COPD (chronic obstructive pulmonary disease) ([place])   . Coronary artery disease   . History of hysterectomy   . Hyperthyroidism   . Seizures ([place])   . Stroke [facility])     Past Surgical History:  Procedure Laterality Date  . CORONARY ARTERY BYPASS GRAFT    . open heart surgery      Review of systems negative except as noted in HPI / PMHx or noted below:  Review of Systems  Constitutional: Negative.   HENT: Negative.   Eyes: Negative.   Respiratory: Negative.   Cardiovascular: Negative.   Gastrointestinal: Negative.   Genitourinary: Negative.   Musculoskeletal: Negative.   Skin: Negative.   Neurological: Negative.   Endo/Heme/Allergies: Negative.   Psychiatric/Behavioral: Negative.      Objective:   Vitals:   10/19/16 1533  BP: 110/70  Pulse: 92  Resp: 20          Physical Exam  Constitutional: She is well-developed, well-nourished, and in no distress.  HENT:  Head: Normocephalic.  Right Ear: Tympanic membrane, external ear and ear canal normal.  Left Ear: Tympanic membrane, external ear and ear canal normal.  Nose: Nose normal. No mucosal edema or rhinorrhea.  Mouth/Throat: Uvula is midline, oropharynx is clear and moist and mucous membranes are normal. No oropharyngeal exudate.  Eyes: Conjunctivae are normal.  Neck: Trachea normal. No tracheal tenderness present. No tracheal deviation present. No thyromegaly present.  Cardiovascular: Normal rate, regular rhythm, S1 normal, S2 normal and normal heart sounds.   No murmur heard. Pulmonary/Chest: No stridor. No respiratory distress. She has wheezes (bilateral inspiratory and expiratory wheezes). She has no rales.  Musculoskeletal:  She exhibits no edema.  Lymphadenopathy:       Head (right side): No tonsillar adenopathy present.       Head (left side): No tonsillar adenopathy present.    She has no cervical adenopathy.  Neurological: She is alert. Gait normal.  Skin: No rash noted. She is not diaphoretic. No erythema. Nails show no clubbing.  Psychiatric: Mood and affect normal.    Diagnostics:    Spirometry was performed and demonstrated an FEV1 of 0.72 at 36 % of predicted.  The patient had an Asthma Control Test with the following results: ACT Total Score: 15.    Assessment and Plan:   1. Asthma with COPD with exacerbation ([place])   2. Other allergic rhinitis   3. LPRD (laryngopharyngeal reflux disease)     1. Continue Spiriva handihaler one inhaled capsule contents one time per day.  2. Restart Symbicort  160 - 2 inhalations 2 times per day with spacer  3. Add Qvar 80 REDIHALER  - 2 inhalations 2 times a day to Symbicort during 'flare up'  4. Continue Proair HFA and albuterol nebulization if needed.  5. Continue reflux treatment:   A. decrease daily caffeine and chocolate consumption  B. omeprazole 40 mg 1 time per day in morning  C. ranitidine 300 mg 1 time per day in evening  7. Continue OTC Nasacort / Rhinocort one spray each nostril 1 time per day  8. Can use over-the-counter antihistamine if needed  9. For this recent episode:   A. Depomedrol 80 IM delivered in clinic  B. Duoneb nebulization delivered in clinic  10. Return in 6 months or earlier if problem  11. Obtain fall flu vaccine  Weda appears to have developed an exacerbation of her respiratory tract disease which is most likely secondary to a viral respiratory tract infection and I will have her reinitiate use of her Symbicort and I have given her a systemic steroid today to help with her inflammation. I will assume she will do well and I will see her back in this clinic in 6 months or earlier if there is a problem. She will  continue to use anti-inflammatory agents for her respiratory tract on a consistent basis and also continue to treat her reflux as noted above.  Allena Katz, MD Allergy / Immunology [place]

## 2016-10-20 ENCOUNTER — Other Ambulatory Visit: Payer: Self-pay | Admitting: *Deleted

## 2016-10-20 MED ORDER — FLUTICASONE PROPIONATE HFA 110 MCG/ACT IN AERO: 2.0000 | INHALATION_SPRAY | Freq: Two times a day (BID) | RESPIRATORY_TRACT | 5 refills | Status: DC

## 2016-10-29 ENCOUNTER — Other Ambulatory Visit: Payer: Self-pay | Admitting: Allergy and Immunology

## 2016-11-02 DIAGNOSIS — I110 Hypertensive heart disease with heart failure: Secondary | ICD-10-CM | POA: Insufficient documentation

## 2016-11-05 ENCOUNTER — Other Ambulatory Visit: Payer: Self-pay | Admitting: Allergy and Immunology

## 2016-12-10 ENCOUNTER — Other Ambulatory Visit: Payer: Self-pay | Admitting: Allergy and Immunology

## 2016-12-18 ENCOUNTER — Encounter: Payer: Self-pay | Admitting: Neurology

## 2016-12-18 ENCOUNTER — Ambulatory Visit (INDEPENDENT_AMBULATORY_CARE_PROVIDER_SITE_OTHER): Payer: Medicaid Other | Admitting: Neurology

## 2016-12-18 VITALS — BP 124/68 | HR 84 | Ht 60.0 in | Wt 159.0 lb

## 2016-12-18 DIAGNOSIS — Z8673 Personal history of transient ischemic attack (TIA), and cerebral infarction without residual deficits: Secondary | ICD-10-CM | POA: Diagnosis not present

## 2016-12-18 DIAGNOSIS — G40209 Localization-related (focal) (partial) symptomatic epilepsy and epileptic syndromes with complex partial seizures, not intractable, without status epilepticus: Secondary | ICD-10-CM | POA: Diagnosis not present

## 2016-12-18 MED ORDER — LEVETIRACETAM 500 MG PO TABS: ORAL_TABLET | ORAL | 3 refills | Status: DC

## 2016-12-18 NOTE — Progress Notes (Signed)
NEUROLOGY FOLLOW UP OFFICE NOTE  Bethany Sosa [id number] 1956/02/17  HISTORY OF PRESENT ILLNESS: I had the pleasure of seeing Bethany Sosa in follow-up in the neurology clinic on 12/18/2016.  The patient was last seen 4 months ago for focal seizures. She is again accompanied by her granddaughter who help supplement the history today.  Records and images were personally reviewed where available.  Her routine EEG showed occasional slowing over the left frontotemporal region. Since her last visit, they had called our office to report 2 seizures on 09/17/16. She reports inability to control her right leg with right leg jerking. She was sleeping the whole day after. Keppra dose increased to 750mg  BID, no further seizures since then, no side effects. She denies any headaches, dizziness, neck/back pain, focal numbness/tingling/weakness, no falls.  HPI 08/14/2016: This is a pleasant 61 yo RH woman with a history of hyperlipidemia, CAD s/p CABG, bilateral carotid artery stenosis (?occlusion), left frontal stroke in February 2018, presenting for seizures. Her granddaughter reports the first episode occurred in January 2018, she witnessed upper body shaking with difficulty speaking lasting a few minutes, then she was back to baseline. On 04/22/16, she felt that "something was wrong with me." She denied any focal weakness. Per PCP notes, she was "saying weird off the wall and repeating herself." She was admitted to [facility] 3 days where she saw Teleneurology and was diagnosed with a stroke. She was reported to have complete blockage of both carotid arteries. Her family reports that after the stroke, she started having episodes of her right leg giving out on her. One time in March while at the store, she was using her walker and told her daughter she needed to sit down. She apparently passed out and slumped to her right side, unconscious for a couple of seconds. No shaking noted by daughter. Her  granddaughter reports an episode at the kitchen table where she had shaking of the right leg and arm for a split second. She felt this coming on, like she could not work her leg. Her granddaughter reported she would not talk until the shaking stopped, then denied any post-event weakness or confusion. It appears she would have these right-sided episodes every couple of weeks. No associated tongue bite or incontinence. The last one occurred 06/23/16, she was not feeling good that day and was helped to the bedside commode. She used the commode then her head went back and she passed out for a few seconds. When she came to, EMS was around her. She did not recall having any focal weakness. She had an MRI without contrast on 06/24/16 which I personally reviewed, there was some continued restricted diffusion in the left ACA territory similar to prior MRI in February, extending to the level of the anterior genu of the corpus callosum. No new infarcts. The temporal lobes were symmetric. She was discharged home on Keppra 500mg  BID. Since then, her family denies any further right-sided symptoms or loss of consciousness. She denies any side effects on the Keppra. She denies any olfactory/gustatory hallucinations, deja vu, rising epigastric sensation, gaps in time, staring/unresponsive episodes. She had memory changes after the stroke and reports there are still some bits and pieces that she cannot remember. Prior to the stroke, she was driving without difficulties, no missed bills payments (her daughter has been in charge of finances since the stroke). Her daughter has been helping with medications since the stroke, prior to this she was forgetting every now and then.  She denies any headaches, dizziness, diplopia, dysarthria/dysphagia, neck/back pain, bowel/bladder dysfunction. It appears she was complaining of cough in September 2017 and had a chest xray which showed mild compression deformities in the mid-thoracic spine of  indeterminate age. She was reporting back pain to her PCP and was referred to Ortho. She currently denies any back pain. She lives with her children. Her father had Alzheimer's disease. She denies any history of alcohol use.  She had a normal birth and early development.  There is no history of febrile convulsions, CNS infections such as meningitis/encephalitis, significant traumatic brain injury, neurosurgical procedures, or family history of seizures.  PAST MEDICAL HISTORY: Past Medical History:  Diagnosis Date  . Allergic rhinitis   . Asthma   . COPD (chronic obstructive pulmonary disease) ([place])   . Coronary artery disease   . History of hysterectomy   . Hyperthyroidism   . Seizures ([place])   . Stroke [facility])     MEDICATIONS: Current Outpatient Prescriptions on File Prior to Visit  Medication Sig Dispense Refill  . albuterol (PROAIR HFA) 108 (90 Base) MCG/ACT inhaler Inhale 2 puffs into the lungs every 4 (four) hours as needed for wheezing or shortness of breath. 1 Inhaler 1  . albuterol (PROVENTIL) (2.5 MG/3ML) 0.083% nebulizer solution Take 2.5 mg by nebulization every 4 (four) hours as needed for wheezing or shortness of breath.    Marland Kitchen aspirin 81 MG EC tablet Take 81 mg by mouth daily. Swallow whole.    Marland Kitchen azelastine (ASTELIN) 0.1 % nasal spray Place 1 spray into both nostrils 2 (two) times daily. (Patient taking differently: Place 1 spray into both nostrils 2 (two) times daily as needed for rhinitis or allergies. ) 30 mL 5  . budesonide-formoterol (SYMBICORT) 160-4.5 MCG/ACT inhaler Inhale two puffs twice daily to prevent cough or wheeze 10.2 g 0  . citalopram (CELEXA) 20 MG tablet Take 20 mg by mouth daily.    . clopidogrel (PLAVIX) 75 MG tablet Take 75 mg by mouth daily.    Marland Kitchen dextromethorphan-guaiFENesin (MUCINEX DM) 30-600 MG 12hr tablet Take 1 tablet by mouth 2 (two) times daily as needed for cough.    . fluticasone (FLOVENT HFA) 110 MCG/ACT inhaler Inhale 2 puffs into the lungs 2 (two)  times daily. 1 Inhaler 5  . gemfibrozil (LOPID) 600 MG tablet Take 600 mg by mouth 2 (two) times daily before a meal.    . levETIRAcetam (KEPPRA) 500 MG tablet Take 1 1/2 (one and a half) tablets by mouth twice daily 180 tablet 3  . levothyroxine (SYNTHROID, LEVOTHROID) 125 MCG tablet Take 1 tablet (125 mcg total) by mouth daily. 30 tablet 0  . metoprolol tartrate (LOPRESSOR) 25 MG tablet Take 0.5 tablets (12.5 mg total) by mouth 2 (two) times daily. 60 tablet 0  . nitroGLYCERIN (NITROSTAT) 0.4 MG SL tablet Place 0.4 mg under the tongue every 5 (five) minutes as needed for chest pain.    Marland Kitchen omeprazole (PRILOSEC) 40 MG capsule TAKE ONE CAPSULE BY MOUTH ONCE DAILY IN THE MORNING BEFORE BREAKFAST 30 capsule 0  . omeprazole (PRILOSEC) 40 MG capsule TAKE ONE CAPSULE BY MOUTH ONCE DAILY IN THE MORNING BEFORE BREAKFAST 90 capsule 1  . QVAR 80 MCG/ACT inhaler INHALE TWO PUFFS INTO THE LUNGS TWICE DAILY. 1 Inhaler 4  . ranitidine (ZANTAC) 300 MG tablet TAKE 1 TABLET BY MOUTH IN THE EVENING (NEEDS APPOINTMENT) 30 tablet 5  . rosuvastatin (CRESTOR) 40 MG tablet Take 40 mg by mouth daily.    Marland Kitchen  SPIRIVA HANDIHALER 18 MCG inhalation capsule INHALE ONE CAPSULE BY MOUTH ONCE DAILY AS DIRECTED 30 capsule 5   Current Facility-Administered Medications on File Prior to Visit  Medication Dose Route Frequency Provider Last Rate Last Dose  . ipratropium-albuterol (DUONEB) 0.5-2.5 (3) MG/3ML nebulizer solution 3 mL  3 mL Nebulization Once Kennith Gain, MD      . ipratropium-albuterol (DUONEB) 0.5-2.5 (3) MG/3ML nebulizer solution 3 mL  3 mL Nebulization Q6H Kozlow, Donnamarie Poag, MD        ALLERGIES: Allergies  Allergen Reactions  . Codeine     GI issues    FAMILY HISTORY: Family History  Problem Relation Age of Onset  . Asthma Father     SOCIAL HISTORY: Social History   Social History  . Marital status: Widowed    Spouse name: N/A  . Number of children: N/A  . Years of education: N/A    Occupational History  . Not on file.   Social History Main Topics  . Smoking status: Former Smoker    Types: Cigarettes  . Smokeless tobacco: Never Used  . Alcohol use No  . Drug use: No  . Sexual activity: Not on file   Other Topics Concern  . Not on file   Social History Narrative  . No narrative on file    REVIEW OF SYSTEMS: Constitutional: No fevers, chills, or sweats, no generalized fatigue, change in appetite Eyes: No visual changes, double vision, eye pain Ear, nose and throat: No hearing loss, ear pain, nasal congestion, sore throat Cardiovascular: No chest pain, palpitations Respiratory:  No shortness of breath at rest or with exertion, wheezes GastrointestinaI: No nausea, vomiting, diarrhea, abdominal pain, fecal incontinence Genitourinary:  No dysuria, urinary retention or frequency Musculoskeletal:  No neck pain, back pain Integumentary: No rash, pruritus, skin lesions Neurological: as above Psychiatric: No depression, insomnia, anxiety Endocrine: No palpitations, fatigue, diaphoresis, mood swings, change in appetite, change in weight, increased thirst Hematologic/Lymphatic:  No anemia, purpura, petechiae. Allergic/Immunologic: no itchy/runny eyes, nasal congestion, recent allergic reactions, rashes  PHYSICAL EXAM: Vitals:   12/18/16 1505  BP: 124/68  Pulse: 84  SpO2: 96%   General: No acute distress Head:  Normocephalic/atraumatic Neck: supple, no paraspinal tenderness, full range of motion Heart:  Regular rate and rhythm Lungs:  Clear to auscultation bilaterally Back: No paraspinal tenderness Skin/Extremities: No rash, no edema Neurological Exam: alert and oriented to person, place, and time. No aphasia or dysarthria. Fund of knowledge is appropriate.  Recent and remote memory are intact.  Attention and concentration are normal.    Able to name objects and repeat phrases. Cranial nerves: Pupils equal, round, reactive to light.  Fundoscopic exam  unremarkable, no papilledema. Extraocular movements intact with no nystagmus. Visual fields full. Facial sensation intact. No facial asymmetry. Tongue, uvula, palate midline.  Motor: Bulk and tone normal, muscle strength 5/5 throughout with no pronator drift.  Sensation to light touch intact.  No extinction to double simultaneous stimulation.  Deep tendon reflexes brisk +3 throughout, toes downgoing.  Finger to nose testing intact.  Gait narrow-based and steady, able to tandem walk adequately.  Romberg negative.  IMPRESSION: This is a pleasant 61 yo RH woman with a history of hyperlipidemia, CAD s/p CABG, bilateral carotid artery stenosis (?occlusion), left ACA stroke in February 2018, with focal seizures. She describes a sensation of inability to control her right leg, with right leg and arm shaking. Family also describes episodes of loss of consciousness with no clear shaking. EEG  showed occasional left frontotemporal slowing, no epileptiform discharges. No further seizures since 09/17/16, now on Keppra 750mg  BID with no side effects. Continue current dose, refills sent. She does not drive. She will follow-up in 6 months and knows to call for any changes.   Thank you for allowing me to participate in her care.  Please do not hesitate to call for any questions or concerns.  The duration of this appointment visit was 15 minutes of face-to-face time with the patient.  Greater than 50% of this time was spent in counseling, explanation of diagnosis, planning of further management, and coordination of care.   Ellouise Newer, M.D.   CC: Dr. Theda Sers

## 2016-12-18 NOTE — Patient Instructions (Signed)
1. Continue Keppra 500mg : Take 1.5 tablets twice a day 2. Follow-up in 6 months, call for any changes  Seizure Precautions: 1. If medication has been prescribed for you to prevent seizures, take it exactly as directed.  Do not stop taking the medicine without talking to your doctor first, even if you have not had a seizure in a long time.   2. Avoid activities in which a seizure would cause danger to yourself or to others.  Don't operate dangerous machinery, swim alone, or climb in high or dangerous places, such as on ladders, roofs, or girders.  Do not drive unless your doctor says you may.  3. If you have any warning that you may have a seizure, lay down in a safe place where you can't hurt yourself.    4.  No driving for 6 months from last seizure, as per [facility].   Please refer to the following link on the [place] website for more information: http://[email or web address]/[email or web address]/drivingu.cfm   5.  Maintain good sleep hygiene. Avoid alcohol.  6.  Contact your doctor if you have any problems that may be related to the medicine you are taking.  7.  Call 911 and bring the patient back to the ED if:        A.  The seizure lasts longer than 5 minutes.       B.  The patient doesn't awaken shortly after the seizure  C.  The patient has new problems such as difficulty seeing, speaking or moving  D.  The patient was injured during the seizure  E.  The patient has a temperature over 102 F (39C)  F.  The patient vomited and now is having trouble breathing

## 2017-05-12 ENCOUNTER — Other Ambulatory Visit: Payer: Self-pay | Admitting: Allergy and Immunology

## 2017-06-11 ENCOUNTER — Other Ambulatory Visit: Payer: Self-pay | Admitting: Allergy and Immunology

## 2017-06-11 NOTE — Telephone Encounter (Signed)
Will need appointment for more refills

## 2017-06-22 ENCOUNTER — Ambulatory Visit: Payer: Medicaid Other | Admitting: Neurology

## 2017-06-22 ENCOUNTER — Other Ambulatory Visit: Payer: Self-pay

## 2017-06-22 ENCOUNTER — Encounter: Payer: Self-pay | Admitting: Neurology

## 2017-06-22 VITALS — BP 126/80 | HR 80 | Ht 60.0 in | Wt 167.0 lb

## 2017-06-22 DIAGNOSIS — Z8673 Personal history of transient ischemic attack (TIA), and cerebral infarction without residual deficits: Secondary | ICD-10-CM | POA: Diagnosis not present

## 2017-06-22 DIAGNOSIS — G40209 Localization-related (focal) (partial) symptomatic epilepsy and epileptic syndromes with complex partial seizures, not intractable, without status epilepticus: Secondary | ICD-10-CM

## 2017-06-22 MED ORDER — LEVETIRACETAM 500 MG PO TABS: ORAL_TABLET | ORAL | 3 refills | Status: DC

## 2017-06-22 NOTE — Progress Notes (Signed)
NEUROLOGY FOLLOW UP OFFICE NOTE  Bethany Sosa [id number] 07-May-1955  HISTORY OF PRESENT ILLNESS: I had the pleasure of seeing Bethany Sosa in follow-up in the neurology clinic on 06/22/2017.  She is again accompanied by her granddaughter who help supplement the history today. The patient was last seen 6 months ago for focal post-stroke seizures. She had a left ACA stroke in 06/2016. Her routine EEG showed occasional slowing over the left frontotemporal region. Her last seizure was in July 2018 with inability to control her right leg with right leg jerking. None since increasing Keppra dose to 750mg  BID, no side effects. Family denies any staring/unresponsive episodes. She denies any gaps in time, olfactory/gustatory hallucinations, myoclonic jerks. No headaches, dizziness, neck/back pain, focal numbness/tingling/weakness, no falls. Mood is good.   HPI 08/14/2016: This is a pleasant 62 yo RH woman with a history of hyperlipidemia, CAD s/p CABG, bilateral carotid artery stenosis (?occlusion), left frontal stroke in February 2018, presenting for seizures. Her granddaughter reports the first episode occurred in January 2018, she witnessed upper body shaking with difficulty speaking lasting a few minutes, then she was back to baseline. On 04/22/16, she felt that "something was wrong with me." She denied any focal weakness. Per PCP notes, she was "saying weird off the wall and repeating herself." She was admitted to [facility] for 3 days where she saw Teleneurology and was diagnosed with a stroke. She was reported to have complete blockage of both carotid arteries. Her family reports that after the stroke, she started having episodes of her right leg giving out on her. One time in March while at the store, she was using her walker and told her daughter she needed to sit down. She apparently passed out and slumped to her right side, unconscious for a couple of seconds. No shaking noted by daughter. Her  granddaughter reports an episode at the kitchen table where she had shaking of the right leg and arm for a split second. She felt this coming on, like she could not work her leg. Her granddaughter reported she would not talk until the shaking stopped, then denied any post-event weakness or confusion. It appears she would have these right-sided episodes every couple of weeks. No associated tongue bite or incontinence. The last one occurred 06/23/16, she was not feeling good that day and was helped to the bedside commode. She used the commode then her head went back and she passed out for a few seconds. When she came to, EMS was around her. She did not recall having any focal weakness. She had an MRI without contrast on 06/24/16 which I personally reviewed, there was some continued restricted diffusion in the left ACA territory similar to prior MRI in February, extending to the level of the anterior genu of the corpus callosum. No new infarcts. The temporal lobes were symmetric. She was discharged home on Keppra 500mg  BID. Since then, her family denies any further right-sided symptoms or loss of consciousness. She denies any side effects on the Keppra. She denies any olfactory/gustatory hallucinations, deja vu, rising epigastric sensation, gaps in time, staring/unresponsive episodes. She had memory changes after the stroke and reports there are still some bits and pieces that she cannot remember. Prior to the stroke, she was driving without difficulties, no missed bills payments (her daughter has been in charge of finances since the stroke). Her daughter has been helping with medications since the stroke, prior to this she was forgetting every now and then.   She  denies any headaches, dizziness, diplopia, dysarthria/dysphagia, neck/back pain, bowel/bladder dysfunction. It appears she was complaining of cough in September 2017 and had a chest xray which showed mild compression deformities in the mid-thoracic spine of  indeterminate age. She was reporting back pain to her PCP and was referred to Ortho. She currently denies any back pain. She lives with her children. Her father had Alzheimer's disease. She denies any history of alcohol use.  She had a normal birth and early development.  There is no history of febrile convulsions, CNS infections such as meningitis/encephalitis, significant traumatic brain injury, neurosurgical procedures, or family history of seizures.  PAST MEDICAL HISTORY: Past Medical History:  Diagnosis Date  . Allergic rhinitis   . Asthma   . COPD (chronic obstructive pulmonary disease) ([place])   . Coronary artery disease   . History of hysterectomy   . Hyperthyroidism   . Seizures ([place])   . Stroke [facility])     MEDICATIONS: Current Outpatient Medications on File Prior to Visit  Medication Sig Dispense Refill  . albuterol (PROAIR HFA) 108 (90 Base) MCG/ACT inhaler Inhale 2 puffs into the lungs every 4 (four) hours as needed for wheezing or shortness of breath. 1 Inhaler 1  . albuterol (PROVENTIL) (2.5 MG/3ML) 0.083% nebulizer solution Take 2.5 mg by nebulization every 4 (four) hours as needed for wheezing or shortness of breath.    Marland Kitchen aspirin 81 MG EC tablet Take 81 mg by mouth daily. Swallow whole.    Marland Kitchen azelastine (ASTELIN) 0.1 % nasal spray Place 1 spray into both nostrils 2 (two) times daily. (Patient taking differently: Place 1 spray into both nostrils 2 (two) times daily as needed for rhinitis or allergies. ) 30 mL 5  . budesonide-formoterol (SYMBICORT) 160-4.5 MCG/ACT inhaler Inhale two puffs twice daily to prevent cough or wheeze 10.2 g 0  . citalopram (CELEXA) 20 MG tablet Take 20 mg by mouth daily.    . clopidogrel (PLAVIX) 75 MG tablet Take 75 mg by mouth daily.    Marland Kitchen dextromethorphan-guaiFENesin (MUCINEX DM) 30-600 MG 12hr tablet Take 1 tablet by mouth 2 (two) times daily as needed for cough.    . fluticasone (FLOVENT HFA) 110 MCG/ACT inhaler Inhale 2 puffs into the lungs 2 (two)  times daily. 1 Inhaler 5  . gemfibrozil (LOPID) 600 MG tablet Take 600 mg by mouth 2 (two) times daily before a meal.    . levETIRAcetam (KEPPRA) 500 MG tablet Take 1 1/2 (one and a half) tablets by mouth twice daily 270 tablet 3  . levothyroxine (SYNTHROID, LEVOTHROID) 125 MCG tablet Take 1 tablet (125 mcg total) by mouth daily. 30 tablet 0  . metoprolol tartrate (LOPRESSOR) 25 MG tablet Take 0.5 tablets (12.5 mg total) by mouth 2 (two) times daily. 60 tablet 0  . nitroGLYCERIN (NITROSTAT) 0.4 MG SL tablet Place 0.4 mg under the tongue every 5 (five) minutes as needed for chest pain.    Marland Kitchen omeprazole (PRILOSEC) 40 MG capsule TAKE ONE CAPSULE BY MOUTH ONCE DAILY IN THE MORNING BEFORE BREAKFAST 30 capsule 0  . omeprazole (PRILOSEC) 40 MG capsule TAKE 1 CAPSULE BY MOUTH ONCE DAILY IN THE MORNING BEFORE BREAKFAST 30 capsule 0  . QVAR 80 MCG/ACT inhaler INHALE TWO PUFFS INTO THE LUNGS TWICE DAILY. 1 Inhaler 4  . ranitidine (ZANTAC) 300 MG tablet TAKE 1 TABLET BY MOUTH ONCE DAILY IN THE EVENING. NEED  APPOINTMENT. 30 tablet 1  . rosuvastatin (CRESTOR) 40 MG tablet Take 40 mg by mouth daily.    Marland Kitchen  SPIRIVA HANDIHALER 18 MCG inhalation capsule INHALE ONE CAPSULE BY MOUTH ONCE DAILY AS DIRECTED 30 capsule 5   Current Facility-Administered Medications on File Prior to Visit  Medication Dose Route Frequency Provider Last Rate Last Dose  . ipratropium-albuterol (DUONEB) 0.5-2.5 (3) MG/3ML nebulizer solution 3 mL  3 mL Nebulization Once Kennith Gain, MD      . ipratropium-albuterol (DUONEB) 0.5-2.5 (3) MG/3ML nebulizer solution 3 mL  3 mL Nebulization Q6H Kozlow, Donnamarie Poag, MD        ALLERGIES: Allergies  Allergen Reactions  . Codeine     GI issues    FAMILY HISTORY: Family History  Problem Relation Age of Onset  . Asthma Father     SOCIAL HISTORY: Social History   Socioeconomic History  . Marital status: Widowed    Spouse name: Not on file  . Number of children: Not on file  .  Years of education: Not on file  . Highest education level: Not on file  Occupational History  . Not on file  Social Needs  . Financial resource strain: Not on file  . Food insecurity:    Worry: Not on file    Inability: Not on file  . Transportation needs:    Medical: Not on file    Non-medical: Not on file  Tobacco Use  . Smoking status: Former Smoker    Types: Cigarettes  . Smokeless tobacco: Never Used  Substance and Sexual Activity  . Alcohol use: No  . Drug use: No  . Sexual activity: Not on file  Lifestyle  . Physical activity:    Days per week: Not on file    Minutes per session: Not on file  . Stress: Not on file  Relationships  . Social connections:    Talks on phone: Not on file    Gets together: Not on file    Attends religious service: Not on file    Active member of club or organization: Not on file    Attends meetings of clubs or organizations: Not on file    Relationship status: Not on file  . Intimate partner violence:    Fear of current or ex partner: Not on file    Emotionally abused: Not on file    Physically abused: Not on file    Forced sexual activity: Not on file  Other Topics Concern  . Not on file  Social History Narrative  . Not on file    REVIEW OF SYSTEMS: Constitutional: No fevers, chills, or sweats, no generalized fatigue, change in appetite Eyes: No visual changes, double vision, eye pain Ear, nose and throat: No hearing loss, ear pain, nasal congestion, sore throat Cardiovascular: No chest pain, palpitations Respiratory:  No shortness of breath at rest or with exertion, wheezes GastrointestinaI: No nausea, vomiting, diarrhea, abdominal pain, fecal incontinence Genitourinary:  No dysuria, urinary retention or frequency Musculoskeletal:  No neck pain, back pain Integumentary: No rash, pruritus, skin lesions Neurological: as above Psychiatric: No depression, insomnia, anxiety Endocrine: No palpitations, fatigue, diaphoresis, mood  swings, change in appetite, change in weight, increased thirst Hematologic/Lymphatic:  No anemia, purpura, petechiae. Allergic/Immunologic: no itchy/runny eyes, nasal congestion, recent allergic reactions, rashes  PHYSICAL EXAM: Vitals:   06/22/17 1506  BP: 126/80  Pulse: 80  SpO2: 98%   General: No acute distress Head:  Normocephalic/atraumatic Neck: supple, no paraspinal tenderness, full range of motion Heart:  Regular rate and rhythm Lungs:  Clear to auscultation bilaterally Back: No paraspinal tenderness Skin/Extremities: No  rash, no edema Neurological Exam: alert and oriented to person, place, and time. No aphasia or dysarthria. Fund of knowledge is appropriate.  Recent and remote memory are intact.  Attention and concentration are normal.    Able to name objects and repeat phrases. Cranial nerves: Pupils equal, round, reactive to light. Extraocular movements intact with no nystagmus. Visual fields full. Facial sensation intact. No facial asymmetry. Tongue, uvula, palate midline.  Motor: Bulk and tone normal, muscle strength 5/5 throughout with no pronator drift.  Sensation to light touch intact.  No extinction to double simultaneous stimulation.  Deep tendon reflexes brisk +3 throughout, toes downgoing.  Finger to nose testing intact.  Gait narrow-based and steady, able to tandem walk adequately.  Romberg negative.  IMPRESSION: This is a pleasant 62 yo RH woman with a history of hyperlipidemia, CAD s/p CABG, bilateral carotid artery stenosis (?occlusion), left ACA stroke in February 2018, with subsequent focal seizures. She describes a sensation of inability to control her right leg, with right leg and arm shaking. Family also describes episodes of loss of consciousness with no clear shaking. EEG showed occasional left frontotemporal slowing, no epileptiform discharges. No further seizures since 09/17/16 on Keppra 750mg  BID with no side effects. Refills sent. She does not drive. She will  follow-up in 6 months and knows to call for any changes.   Thank you for allowing me to participate in her care.  Please do not hesitate to call for any questions or concerns.  The duration of this appointment visit was 15 minutes of face-to-face time with the patient.  Greater than 50% of this time was spent in counseling, explanation of diagnosis, planning of further management, and coordination of care.   Ellouise Newer, M.D.   CC: Dr. Theda Sers

## 2017-06-22 NOTE — Patient Instructions (Signed)
Great seeing you! Continue all your medications. For a sudden change in symptoms, go to ER immediately. Follow-up in 6 months, call for any changes

## 2017-06-25 DIAGNOSIS — R8290 Unspecified abnormal findings in urine: Secondary | ICD-10-CM | POA: Insufficient documentation

## 2017-06-25 DIAGNOSIS — I10 Essential (primary) hypertension: Secondary | ICD-10-CM | POA: Insufficient documentation

## 2017-07-01 ENCOUNTER — Other Ambulatory Visit: Payer: Self-pay | Admitting: Allergy and Immunology

## 2017-07-02 ENCOUNTER — Telehealth: Payer: Self-pay | Admitting: Allergy and Immunology

## 2017-07-02 NOTE — Telephone Encounter (Signed)
Marie l/m for patient to call to make appt for refill

## 2017-07-02 NOTE — Telephone Encounter (Signed)
Patient needs a refill on spiriva sent to [facility] on [address]

## 2017-07-02 NOTE — Telephone Encounter (Signed)
Already denied refill. Patient needs appt was due Feb 2019

## 2017-07-06 ENCOUNTER — Encounter: Payer: Self-pay | Admitting: Family Medicine

## 2017-07-06 ENCOUNTER — Ambulatory Visit (INDEPENDENT_AMBULATORY_CARE_PROVIDER_SITE_OTHER): Payer: Medicaid Other | Admitting: Family Medicine

## 2017-07-06 VITALS — BP 120/72 | HR 72 | Resp 12

## 2017-07-06 DIAGNOSIS — J4489 Other specified chronic obstructive pulmonary disease: Secondary | ICD-10-CM

## 2017-07-06 DIAGNOSIS — J449 Chronic obstructive pulmonary disease, unspecified: Secondary | ICD-10-CM

## 2017-07-06 DIAGNOSIS — J3089 Other allergic rhinitis: Secondary | ICD-10-CM | POA: Diagnosis not present

## 2017-07-06 DIAGNOSIS — K219 Gastro-esophageal reflux disease without esophagitis: Secondary | ICD-10-CM

## 2017-07-06 MED ORDER — TIOTROPIUM BROMIDE MONOHYDRATE 18 MCG IN CAPS: ORAL_CAPSULE | RESPIRATORY_TRACT | 5 refills | Status: DC

## 2017-07-06 MED ORDER — BUDESONIDE-FORMOTEROL FUMARATE 160-4.5 MCG/ACT IN AERO: INHALATION_SPRAY | RESPIRATORY_TRACT | 5 refills | Status: DC

## 2017-07-06 NOTE — Progress Notes (Addendum)
[id number] [place] [id number] Dept: [phone]  FOLLOW UP NOTE  Patient ID: Bethany Sosa, female    DOB: [birth year]  Age: 62 y.o. MRN: [id number] Date of Office Visit: 07/06/2017  Assessment  Chief Complaint: Asthma  HPI Bethany Sosa is a 62 year old female who presents to the clinic for a follow up visit. She was last seen in this clinic on 10/19/2016 by Dr. Neldon Mc for evaluation of asthma with COPD, allergic rhinitis, and laryngopharyngeal reflux. At that time, she had developed shortness of breath and wheezing that required a DepoMedrol 80 mg IM injection for resolution of symptoms.   At today's visit, she reports doing well overall. Her asthma is reported as well controlled with no shortness of breath or cough. She does report some wheezing at night which is longstanding. She is currently using Symbicort 160- 2 puffs with no spacer, Spiriva Handihaler  1 puff once a day, and intermittent Qvar. She has not used her albuterol inhaler since her last visit to this office.   Allergic rhinitis is reported as well controlled with daily over the counter antihistamine and occasional nasal spray.   GERD is reported as well controlled with no heartburn or regurgitation with daily medications including omeprazole 40 mg once a day and ranitidine 300 mg daily.    Her medications are listed in the chart.    Drug Allergies:  Allergies  Allergen Reactions  . Codeine     GI issues    Physical Exam: BP 120/72   Pulse 72   Resp 12    Physical Exam  Constitutional: She appears well-developed and well-nourished.  HENT:  Head: Normocephalic.  Right Ear: External ear normal.  Left Ear: External ear normal.  Bilateral nares slightly erythematous and edematous with clear drainage noted. Pharynx normal. Eyes normal. Ears normal.   Eyes: Conjunctivae are normal.  Neck: Normal range of motion. Neck supple.  Cardiovascular: Normal rate, regular rhythm and normal heart sounds.    Pulmonary/Chest: Effort normal and breath sounds normal.  Lungs clear to auscultation.    Diagnostics: FVC 1.54, FEV1 1.10.  Predicted FVC 2.64, predicted FEV1 2.02 spirometry indicates mild restriction and mild airway obstruction which is consistent with previous values.  Assessment and Plan: 1. Asthma with COPD ([place])   2. LPRD (laryngopharyngeal reflux disease)   3. Other allergic rhinitis     Meds ordered this encounter  Medications  . tiotropium (SPIRIVA HANDIHALER) 18 MCG inhalation capsule    Sig: INHALE ONE CAPSULE BY MOUTH ONCE DAILY AS DIRECTED    Dispense:  30 capsule    Refill:  5  . budesonide-formoterol (SYMBICORT) 160-4.5 MCG/ACT inhaler    Sig: Inhale two puffs with spacer twice daily to prevent cough or wheeze.  Rinse, gargle, and spit after use.    Dispense:  1 Inhaler    Refill:  5    Patient Instructions   1. Continue Spiriva handihaler one inhaled capsule contents one time per day.  2. Continue Symbicort 160 - 2 inhalations 2 times per day with spacer  3. Add Qvar 80 REDIHALER  - 2 inhalations 2 times a day to Symbicort during 'flare up'  4. Continue Proair HFA and albuterol nebulization if needed.  5. Continue reflux treatment:   A. decrease daily caffeine and chocolate consumption  B. omeprazole 40 mg 1 time per day in morning  C. ranitidine 300 mg 1 time per day in evening  7. Continue OTC Nasacort / Rhinocort one  spray each nostril 1 time per day  8. Can use over-the-counter antihistamine if needed  9. Return in 6 months or earlier if problem     Return in about 6 months (around 01/05/2018), or if symptoms worsen or fail to improve.   Bethany Sosa has remained stable with her respiratory tract disease. I will have her continue her current medication regimen with no changes at this time. I will see her back in this office in 6 months or sooner if needed.   Thank you for the opportunity to care for this patient.  Please do not hesitate to  contact me with questions.  Gareth Morgan, FNP [facility] and [place] [facility]  I have provided oversight concerning Gareth Morgan' evaluation and treatment of this patient's health issues addressed during today's encounter. I agree with the assessment and therapeutic plan as outlined in the note.   Signed,   Jiles Prows, MD,  Allergy and Immunology,  [place] [facility] [place].

## 2017-07-06 NOTE — Patient Instructions (Addendum)
  1. Continue Spiriva handihaler one inhaled capsule contents one time per day.  2. Continue Symbicort 160 - 2 inhalations 2 times per day with spacer  3. Add Qvar 80 REDIHALER  - 2 inhalations 2 times a day to Symbicort during 'flare up'  4. Continue Proair HFA and albuterol nebulization if needed.  5. Continue reflux treatment:   A. decrease daily caffeine and chocolate consumption  B. omeprazole 40 mg 1 time per day in morning  C. ranitidine 300 mg 1 time per day in evening  7. Continue OTC Nasacort / Rhinocort one spray each nostril 1 time per day  8. Can use over-the-counter antihistamine if needed  9. Return in 6 months or earlier if problem

## 2017-07-07 ENCOUNTER — Encounter: Payer: Self-pay | Admitting: Family Medicine

## 2017-07-08 ENCOUNTER — Other Ambulatory Visit: Payer: Self-pay | Admitting: Allergy and Immunology

## 2017-07-24 ENCOUNTER — Other Ambulatory Visit: Payer: Self-pay | Admitting: Allergy and Immunology

## 2017-08-04 ENCOUNTER — Other Ambulatory Visit: Payer: Self-pay | Admitting: Allergy and Immunology

## 2017-11-27 ENCOUNTER — Ambulatory Visit (INDEPENDENT_AMBULATORY_CARE_PROVIDER_SITE_OTHER): Payer: Medicaid Other | Admitting: Allergy

## 2017-11-27 ENCOUNTER — Encounter: Payer: Self-pay | Admitting: Allergy

## 2017-11-27 VITALS — BP 138/78 | HR 60 | Temp 98.6°F | Resp 20

## 2017-11-27 DIAGNOSIS — J45901 Unspecified asthma with (acute) exacerbation: Secondary | ICD-10-CM

## 2017-11-27 DIAGNOSIS — J3089 Other allergic rhinitis: Secondary | ICD-10-CM

## 2017-11-27 DIAGNOSIS — K219 Gastro-esophageal reflux disease without esophagitis: Secondary | ICD-10-CM

## 2017-11-27 DIAGNOSIS — J441 Chronic obstructive pulmonary disease with (acute) exacerbation: Secondary | ICD-10-CM | POA: Diagnosis not present

## 2017-11-27 MED ORDER — AMOXICILLIN-POT CLAVULANATE 875-125 MG PO TABS: ORAL_TABLET | ORAL | 0 refills | Status: DC

## 2017-11-27 NOTE — Progress Notes (Signed)
Follow-up Note  RE: Bethany Sosa MRN: [id number] DOB: [birth year] Date of Office Visit: 11/27/2017   History of present illness: Bethany Sosa is a 62 y.o. female presenting today for sick visit.  She was last seen in the office on July 06, 2017 by our nurse practitioner, Gareth Morgan for routine follow-up.  She has a history of asthma with COPD, reflux and allergic rhinitis. She states about 3 days ago she developed a cough that somewhat productive with a sore throat as well as nasal congestion and drainage.  She has been requiring use of her albuterol 3 times a day since the symptoms started.  She denies any fevers, night sweats or chills at this time.  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  She does continue to take her Spiriva as well as Symbicort 2 puffs twice a day.  It has been asked that she add in Qvar to her regimen during asthma flareups or respiratory illnesses.  She states she has not had it in the Qvar yet but states she does plan to do this.  She also continues to take her omeprazole and ranitidine daily.  She is only been using her Nasacort as needed and states she has not been using it since her illness.  Review of systems: Review of Systems  Constitutional: Negative for chills, fever and malaise/fatigue.  HENT: Positive for congestion and sore throat. Negative for ear discharge, ear pain, nosebleeds and sinus pain.   Eyes: Negative for pain, discharge and redness.  Respiratory: Positive for cough, sputum production, shortness of breath and wheezing.   Cardiovascular: Negative for chest pain.  Gastrointestinal: Negative for abdominal pain, constipation, diarrhea, heartburn, nausea and vomiting.  Musculoskeletal:  Negative for joint pain.  Skin: Negative for itching and rash.  Neurological: Negative for headaches.    All other systems negative unless noted above in HPI  Past medical/social/surgical/family history have been reviewed and are unchanged unless specifically indicated below.  No changes  Medication List: Allergies as of 11/27/2017      Reactions   Codeine    GI issues      Medication List        Accurate as of 11/27/17  5:33 PM. Always use your most recent med list.          albuterol (2.5 MG/3ML) 0.083% nebulizer solution Commonly known as:  PROVENTIL Take 2.5 mg by nebulization every 4 (four) hours as needed for wheezing or shortness of breath.   albuterol 108 (90 Base) MCG/ACT inhaler Commonly known as:  PROVENTIL HFA;VENTOLIN HFA Inhale 2 puffs into the lungs every 4 (four) hours as needed for wheezing or shortness of breath.   amoxicillin-clavulanate 875-125 MG tablet Commonly known as:  AUGMENTIN Take one tablet twice daily for 10 days   aspirin 81 MG EC tablet Take 81 mg by mouth daily. Swallow whole.   budesonide-formoterol 160-4.5 MCG/ACT inhaler Commonly known as:  SYMBICORT Inhale two puffs with spacer twice daily to prevent cough or wheeze.  Rinse, gargle, and spit after use.   citalopram 20 MG tablet Commonly known as:  CELEXA Take 20 mg by mouth daily.   clopidogrel 75 MG tablet Commonly known as:  PLAVIX Take 75 mg by mouth daily.   fluticasone 110 MCG/ACT inhaler Commonly known as:  FLOVENT HFA Inhale two puffs twice daily to prevent cough or wheeze. Rinse mouth after use.   gemfibrozil 600 MG tablet Commonly known as:  LOPID Take 600 mg by mouth 2 (two) times daily before a meal.   levETIRAcetam 500 MG tablet Commonly known as:  KEPPRA Take 1.5 tablets by mouth twice daily   levothyroxine 125 MCG tablet Commonly known as:  SYNTHROID, LEVOTHROID Take 1 tablet (125 mcg total) by mouth daily.   metoprolol tartrate 25 MG  tablet Commonly known as:  LOPRESSOR Take 0.5 tablets (12.5 mg total) by mouth 2 (two) times daily.   nitroGLYCERIN 0.4 MG SL tablet Commonly known as:  NITROSTAT Place 0.4 mg under the tongue every 5 (five) minutes as needed for chest pain.   omeprazole 40 MG capsule Commonly known as:  PRILOSEC TAKE 1 CAPSULE BY MOUTH IN THE MORNING BEFORE BREAKFAST   PRESCRIPTION MEDICATION New seizure medication- patient unsure of name of medicine   ranitidine 300 MG tablet Commonly known as:  ZANTAC TAKE 1 TABLET BY MOUTH ONCE DAILY IN THE EVENING .NEED  APPOINTMENT.   rosuvastatin 40 MG tablet Commonly known as:  CRESTOR Take 40 mg by mouth daily.   tiotropium 18 MCG inhalation capsule Commonly known as:  SPIRIVA INHALE ONE CAPSULE BY MOUTH ONCE DAILY AS DIRECTED       Known medication allergies: Allergies  Allergen Reactions  . Codeine     GI issues     Physical examination: Blood pressure 138/78, pulse 60, temperature 98.6 F (37 C), temperature source Oral, resp. rate 20.  General: Alert, interactive, in no acute distress. HEENT: PERRLA, TMs pearly gray, turbinates moderately edematous with clear discharge, post-pharynx non erythematous. Neck: Supple without lymphadenopathy. Lungs: decreased BS of LLL otherwise clear other lung fields. {no increased work of breathing. CV: Normal S1, S2 without murmurs. Abdomen: Nondistended, nontender. Skin: Warm and dry, without lesions or rashes. Extremities:  No clubbing, cyanosis or edema. Neuro:   Grossly intact.  Diagnositics/Labs:  Spirometry: FEV1: 1.24L 60%, FVC: 1.76L 65% which is reduced from previous study  Assessment and plan:   Asthma with COPD with exacerbation -concerned she may have a developing pneumonia given asymmetric lung exam and history of productive cough with increased need for albuterol use.  Also concern for pneumonia given history of sick mother who has confirmed pneumonia on chest x-ray and is on  antibiotics.  We will we will treat with course of Augmentin as well as 5 days of prednisone.  She will continue current management as below Allergic rhinitis -I have advised while she is sick with increased nasal congestion and drainage to resume use of her Nasacort continue aggressive management as below LPRD -continue routine management as below  1. Continue Spiriva handihaler one inhaled capsule contents one time per day.  2. Continue Symbicort 160 - 2 inhalations 2 times per day with spacer  3. Add Qvar 80 REDIHALER  - 2 inhalations 2 times a day to Symbicort during 'asthma flare up' or respiratory tract infections  4. Continue Proair HFA and albuterol nebulization if needed.  5. Continue reflux treatment:   A. decrease daily caffeine and chocolate consumption  B. omeprazole 40 mg 1 time per day in morning  C. ranitidine 300 mg 1 time per day in evening  7. Continue OTC Nasacort / Rhinocort one spray each nostril 1-2 time per day  8. Can  use over-the-counter antihistamine if needed  9.  Concern for PNA with decreased lung sounds on left side and caregiver for mother with confirmed PNA will treat with Augmentin 875mg  1 tablet twice a day x 10 days.    10.  For asthma flare in addition to antibiotic above take prednisone 20mg  twice a day x 5 days.   11.  Obtain flu vaccine if you haven't already once you are feeling better  12. Return in 6 months or earlier if problem  I appreciate the opportunity to take part in Madyson's care. Please do not hesitate to contact me with questions.  Sincerely,   Prudy Feeler, MD Allergy/Immunology [facility]

## 2017-11-27 NOTE — Patient Instructions (Signed)
  1. Continue Spiriva handihaler one inhaled capsule contents one time per day.  2. Continue Symbicort 160 - 2 inhalations 2 times per day with spacer  3. Add Qvar 80 REDIHALER  - 2 inhalations 2 times a day to Symbicort during 'asthma flare up' or respiratory tract infections  4. Continue Proair HFA and albuterol nebulization if needed.  5. Continue reflux treatment:   A. decrease daily caffeine and chocolate consumption  B. omeprazole 40 mg 1 time per day in morning  C. ranitidine 300 mg 1 time per day in evening  7. Continue OTC Nasacort / Rhinocort one spray each nostril 1-2 time per day  8. Can use over-the-counter antihistamine if needed  9.  Concern for PNA with decreased lung sounds on left side and caregiver for mother with confirmed PNA will treat with Augmentin 875mg  1 tablet twice a day x 10 days.    10.  For asthma flare in addition to antibiotic above take prednisone 20mg  twice a day x 5 days.   11.  Obtain flu vaccine if you haven't already once you are feeling better  12. Return in 6 months or earlier if problem

## 2017-12-03 ENCOUNTER — Other Ambulatory Visit: Payer: Self-pay | Admitting: Allergy and Immunology

## 2018-01-04 ENCOUNTER — Ambulatory Visit: Payer: Medicaid Other | Admitting: Family Medicine

## 2018-01-08 ENCOUNTER — Encounter: Payer: Self-pay | Admitting: Neurology

## 2018-01-08 ENCOUNTER — Other Ambulatory Visit: Payer: Self-pay

## 2018-01-08 ENCOUNTER — Ambulatory Visit (INDEPENDENT_AMBULATORY_CARE_PROVIDER_SITE_OTHER): Payer: Medicaid Other | Admitting: Neurology

## 2018-01-08 VITALS — BP 130/80 | HR 82 | Ht 60.0 in | Wt 185.0 lb

## 2018-01-08 DIAGNOSIS — Z8673 Personal history of transient ischemic attack (TIA), and cerebral infarction without residual deficits: Secondary | ICD-10-CM

## 2018-01-08 DIAGNOSIS — R413 Other amnesia: Secondary | ICD-10-CM

## 2018-01-08 DIAGNOSIS — G40209 Localization-related (focal) (partial) symptomatic epilepsy and epileptic syndromes with complex partial seizures, not intractable, without status epilepticus: Secondary | ICD-10-CM

## 2018-01-08 MED ORDER — LEVETIRACETAM 1000 MG PO TABS: 1000.0000 mg | ORAL_TABLET | Freq: Two times a day (BID) | ORAL | 3 refills | Status: DC

## 2018-01-08 NOTE — Progress Notes (Signed)
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  She is again accompanied by her daughter who helps supplement the history today. Since her last visit, she has not had any further episodes of right leg jerking with inability to control the right leg since July 2018. Her daughter however reports an episode a few days ago where she noticed her right arm was jerking. She was still able to do things and control her arm but could not stop the jerking. It is unclear how long it lasted, face and leg were unaffected. No numbness/tingling, post-event weakness. She denies missing any doses of Keppra 750mg  BID. Her daughter has started to notice her memory has been off with "little bitty things." She denies getting lost driving, no missed bills or medications. They deny any staring/unresponsive episodes, gaps in time, olfactory/gustatory hallucinations, no headaches, dizziness, no falls. Mood is good.   HPI 08/14/2016: This is a pleasant 62 yo RH woman with a history of hyperlipidemia, CAD s/p CABG, bilateral carotid artery stenosis (?occlusion), left frontal stroke in February 2018, presenting for seizures. Her granddaughter reports the first episode occurred in January 2018, she witnessed upper body shaking with difficulty speaking lasting a few minutes, then she was back to baseline. On 04/22/16, she felt that "something was wrong with me." She denied any focal weakness. Per PCP notes, she was "saying weird off the wall and repeating herself." She was admitted to [facility] for 3 days where she saw Teleneurology and was diagnosed with a stroke. She was reported to have complete blockage of both carotid arteries. Her family reports that after the stroke, she started having episodes of her right leg giving out on her. One time in March  while at the store, she was using her walker and told her daughter she needed to sit down. She apparently passed out and slumped to her right side, unconscious for a couple of seconds. No shaking noted by daughter. Her granddaughter reports an episode at the kitchen table where she had shaking of the right leg and arm for a split second. She felt this coming on, like she could not work her leg. Her granddaughter reported she would not talk until the shaking stopped, then denied any post-event weakness or confusion. It appears she would have these right-sided episodes every couple of weeks. No associated tongue bite or incontinence. The last one occurred 06/23/16, she was not feeling good that day and was helped to the bedside commode. She used the commode then her head went back and she passed out for a few seconds. When she came to, EMS was around her. She did not recall having any focal weakness. She had an MRI without contrast on 06/24/16 which I personally reviewed, there was some continued restricted diffusion in the left ACA territory similar to prior MRI in February, extending to the level of the anterior genu of the corpus callosum. No new infarcts. The temporal lobes were symmetric. She was discharged home on Keppra 500mg  BID. Since then, her family denies any further right-sided symptoms or loss of consciousness. She denies any side effects on the Keppra. She denies any olfactory/gustatory hallucinations, deja vu, rising epigastric sensation, gaps in time, staring/unresponsive episodes. She had memory changes after the stroke and reports there are still some bits and pieces that she cannot remember. Prior to  the stroke, she was driving without difficulties, no missed bills payments (her daughter has been in charge of finances since the stroke). Her daughter has been helping with medications since the stroke, prior to this she was forgetting every now and then.   She denies any headaches, dizziness,  diplopia, dysarthria/dysphagia, neck/back pain, bowel/bladder dysfunction. It appears she was complaining of cough in September 2017 and had a chest xray which showed mild compression deformities in the mid-thoracic spine of indeterminate age. She was reporting back pain to her PCP and was referred to Ortho. She currently denies any back pain. She lives with her children. Her father had Alzheimer's disease. She denies any history of alcohol use.  She had a normal birth and early development.  There is no history of febrile convulsions, CNS infections such as meningitis/encephalitis, significant traumatic brain injury, neurosurgical procedures, or family history of seizures.  PAST MEDICAL HISTORY: Past Medical History:  Diagnosis Date  . Allergic rhinitis   . Asthma   . COPD (chronic obstructive pulmonary disease) ([place])   . Coronary artery disease   . History of hysterectomy   . Hyperthyroidism   . Seizures ([place])   . Stroke [facility])     MEDICATIONS: Current Outpatient Medications on File Prior to Visit  Medication Sig Dispense Refill  . albuterol (PROVENTIL HFA;VENTOLIN HFA) 108 (90 Base) MCG/ACT inhaler INHALE 2 PUFFS INTO THE LUNGS EVERY 4 HOURS AS NEEDED FOR WHEEZING OR SHORTNESS OF BREATH 9 each 1  . albuterol (PROVENTIL) (2.5 MG/3ML) 0.083% nebulizer solution Take 2.5 mg by nebulization every 4 (four) hours as needed for wheezing or shortness of breath.    Marland Kitchen amoxicillin-clavulanate (AUGMENTIN) 875-125 MG tablet Take one tablet twice daily for 10 days 20 tablet 0  . aspirin 81 MG EC tablet Take 81 mg by mouth daily. Swallow whole.    . budesonide-formoterol (SYMBICORT) 160-4.5 MCG/ACT inhaler Inhale two puffs with spacer twice daily to prevent cough or wheeze.  Rinse, gargle, and spit after use. 1 Inhaler 5  . citalopram (CELEXA) 20 MG tablet Take 20 mg by mouth daily.    . clopidogrel (PLAVIX) 75 MG tablet Take 75 mg by mouth daily.    . fluticasone (FLOVENT HFA) 110 MCG/ACT inhaler Inhale  two puffs twice daily to prevent cough or wheeze. Rinse mouth after use. 12 g 2  . gemfibrozil (LOPID) 600 MG tablet Take 600 mg by mouth 2 (two) times daily before a meal.    . levETIRAcetam (KEPPRA) 500 MG tablet Take 1.5 tablets by mouth twice daily 270 tablet 3  . levothyroxine (SYNTHROID, LEVOTHROID) 125 MCG tablet Take 1 tablet (125 mcg total) by mouth daily. 30 tablet 0  . metoprolol tartrate (LOPRESSOR) 25 MG tablet Take 0.5 tablets (12.5 mg total) by mouth 2 (two) times daily. 60 tablet 0  . nitroGLYCERIN (NITROSTAT) 0.4 MG SL tablet Place 0.4 mg under the tongue every 5 (five) minutes as needed for chest pain.    Marland Kitchen omeprazole (PRILOSEC) 40 MG capsule TAKE 1 CAPSULE BY MOUTH IN THE MORNING BEFORE BREAKFAST 30 capsule 5  . PRESCRIPTION MEDICATION New seizure medication- patient unsure of name of medicine    . ranitidine (ZANTAC) 300 MG tablet TAKE 1 TABLET BY MOUTH ONCE DAILY IN THE EVENING .NEED  APPOINTMENT. 90 tablet 1  . rosuvastatin (CRESTOR) 40 MG tablet Take 40 mg by mouth daily.    Marland Kitchen tiotropium (SPIRIVA HANDIHALER) 18 MCG inhalation capsule INHALE ONE CAPSULE BY MOUTH ONCE DAILY AS DIRECTED 30  capsule 5   No current facility-administered medications on file prior to visit.     ALLERGIES: Allergies  Allergen Reactions  . Codeine     GI issues    FAMILY HISTORY: Family History  Problem Relation Age of Onset  . Asthma Father     SOCIAL HISTORY: Social History   Socioeconomic History  . Marital status: Widowed    Spouse name: Not on file  . Number of children: Not on file  . Years of education: Not on file  . Highest education level: Not on file  Occupational History  . Not on file  Social Needs  . Financial resource strain: Not on file  . Food insecurity:    Worry: Not on file    Inability: Not on file  . Transportation needs:    Medical: Not on file    Non-medical: Not on file  Tobacco Use  . Smoking status: Former Smoker    Types: Cigarettes  .  Smokeless tobacco: Never Used  Substance and Sexual Activity  . Alcohol use: No  . Drug use: No  . Sexual activity: Not on file  Lifestyle  . Physical activity:    Days per week: Not on file    Minutes per session: Not on file  . Stress: Not on file  Relationships  . Social connections:    Talks on phone: Not on file    Gets together: Not on file    Attends religious service: Not on file    Active member of club or organization: Not on file    Attends meetings of clubs or organizations: Not on file    Relationship status: Not on file  . Intimate partner violence:    Fear of current or ex partner: Not on file    Emotionally abused: Not on file    Physically abused: Not on file    Forced sexual activity: Not on file  Other Topics Concern  . Not on file  Social History Narrative  . Not on file    REVIEW OF SYSTEMS: Constitutional: No fevers, chills, or sweats, no generalized fatigue, change in appetite Eyes: No visual changes, double vision, eye pain Ear, nose and throat: No hearing loss, ear pain, nasal congestion, sore throat Cardiovascular: No chest pain, palpitations Respiratory:  No shortness of breath at rest or with exertion, wheezes GastrointestinaI: No nausea, vomiting, diarrhea, abdominal pain, fecal incontinence Genitourinary:  No dysuria, urinary retention or frequency Musculoskeletal:  No neck pain, back pain Integumentary: No rash, pruritus, skin lesions Neurological: as above Psychiatric: No depression, insomnia, anxiety Endocrine: No palpitations, fatigue, diaphoresis, mood swings, change in appetite, change in weight, increased thirst Hematologic/Lymphatic:  No anemia, purpura, petechiae. Allergic/Immunologic: no itchy/runny eyes, nasal congestion, recent allergic reactions, rashes  PHYSICAL EXAM: Vitals:   01/08/18 1604  BP: 130/80  Pulse: 82  SpO2: 95%   General: No acute distress Head:  Normocephalic/atraumatic Neck: supple, no paraspinal  tenderness, full range of motion Heart:  Regular rate and rhythm Lungs:  Clear to auscultation bilaterally Back: No paraspinal tenderness Skin/Extremities: No rash, no edema Neurological Exam: alert and oriented to person, place, and time. No aphasia or dysarthria. Fund of knowledge is appropriate.  Recent and remote memory are intact.  Attention and concentration are normal.    Able to name objects and repeat phrases.  CDT 5/5  MMSE - Mini Mental State Exam 01/08/2018  Orientation to time 4  Orientation to Place 5  Registration 3  Attention/ Calculation  5  Recall 2  Language- name 2 objects 2  Language- repeat 1  Language- follow 3 step command 3  Language- read & follow direction 1  Write a sentence 1  Copy design 1  Total score 28   Cranial nerves: Pupils equal, round, reactive to light. Extraocular movements intact with no nystagmus. Visual fields full. Facial sensation intact. No facial asymmetry. Tongue, uvula, palate midline.  Motor: Bulk and tone normal, muscle strength 5/5 throughout with no pronator drift.  Sensation to light touch intact.  No extinction to double simultaneous stimulation.  Deep tendon reflexes brisk +3 throughout, toes downgoing.  Finger to nose testing intact.  Gait narrow-based and steady, mild difficulty with tandem walk but able. Romberg negative.  IMPRESSION: This is a pleasant 62 yo RH woman with a history of hyperlipidemia, CAD s/p CABG, bilateral carotid artery stenosis (?occlusion), left ACA stroke in February 2018, with subsequent focal seizures. She describes a sensation of inability to control her right leg, with right leg and arm shaking. Family also described episodes of loss of consciousness with no clear shaking. EEG showed occasional left frontotemporal slowing, no epileptiform discharges. She had an episode of right arm jerking recently with no leg involvement, no loss of consciousness. No clear triggers. Increase Keppra to 1000mg  BID. Continue to  monitor symptoms. Her daughter is reporting memory changes, MMSE today normal 28/30, continue to monitor. She will follow-up in 6 months and knows to call for any changes.   Thank you for allowing me to participate in her care.  Please do not hesitate to call for any questions or concerns.  The duration of this appointment visit was 30 minutes of face-to-face time with the patient.  Greater than 50% of this time was spent in counseling, explanation of diagnosis, planning of further management, and coordination of care.   Ellouise Newer, M.D.   CC: Dr. Theda Sers

## 2018-01-08 NOTE — Patient Instructions (Signed)
1. Increase Keppra to 1000mg  twice a day. With your current bottle of Keppra 500mg , take 2 tablets twice a day. Once done, your new bottle will be for Keppra 1000mg , take 1 tablet twice a day  2. Call our office for any further episodes of right-sided jerking  3. Follow-up in 6 months, call for any changes  Seizure Precautions: 1. If medication has been prescribed for you to prevent seizures, take it exactly as directed.  Do not stop taking the medicine without talking to your doctor first, even if you have not had a seizure in a long time.   2. Avoid activities in which a seizure would cause danger to yourself or to others.  Don't operate dangerous machinery, swim alone, or climb in high or dangerous places, such as on ladders, roofs, or girders.  Do not drive unless your doctor says you may.  3. If you have any warning that you may have a seizure, lay down in a safe place where you can't hurt yourself.    4.  No driving for 6 months from last seizure, as per [facility].   Please refer to the following link on the [place] website for more information: http://[email or web address]/[email or web address]/[email or web address]   5.  Maintain good sleep hygiene. Avoid alcohol  6.  Contact your doctor if you have any problems that may be related to the medicine you are taking.  7.  Call 911 and bring the patient back to the ED if:        A.  The seizure lasts longer than 5 minutes.       B.  The patient doesn't awaken shortly after the seizure  C.  The patient has new problems such as difficulty seeing, speaking or moving  D.  The patient was injured during the seizure  E.  The patient has a temperature over 102 F (39C)  F.  The patient vomited and now is having trouble breathing

## 2018-01-09 ENCOUNTER — Other Ambulatory Visit: Payer: Self-pay | Admitting: Family Medicine

## 2018-01-09 DIAGNOSIS — M545 Low back pain, unspecified: Secondary | ICD-10-CM | POA: Insufficient documentation

## 2018-01-12 ENCOUNTER — Other Ambulatory Visit: Payer: Self-pay | Admitting: Allergy and Immunology

## 2018-01-14 ENCOUNTER — Other Ambulatory Visit: Payer: Self-pay | Admitting: Allergy and Immunology

## 2018-01-19 ENCOUNTER — Other Ambulatory Visit: Payer: Self-pay | Admitting: Allergy and Immunology

## 2018-03-14 DIAGNOSIS — J22 Unspecified acute lower respiratory infection: Secondary | ICD-10-CM | POA: Insufficient documentation

## 2018-05-13 ENCOUNTER — Other Ambulatory Visit: Payer: Self-pay | Admitting: Family Medicine

## 2018-06-08 ENCOUNTER — Other Ambulatory Visit: Payer: Self-pay | Admitting: Allergy

## 2018-08-10 ENCOUNTER — Other Ambulatory Visit: Payer: Self-pay | Admitting: Allergy and Immunology

## 2018-08-10 ENCOUNTER — Other Ambulatory Visit: Payer: Self-pay | Admitting: Allergy

## 2018-08-13 ENCOUNTER — Other Ambulatory Visit: Payer: Self-pay | Admitting: *Deleted

## 2018-08-13 MED ORDER — TIOTROPIUM BROMIDE MONOHYDRATE 18 MCG IN CAPS: ORAL_CAPSULE | RESPIRATORY_TRACT | 0 refills | Status: DC

## 2018-08-13 MED ORDER — SYMBICORT 160-4.5 MCG/ACT IN AERO: INHALATION_SPRAY | RESPIRATORY_TRACT | 0 refills | Status: DC

## 2018-08-13 MED ORDER — FLUTICASONE PROPIONATE HFA 110 MCG/ACT IN AERO: INHALATION_SPRAY | RESPIRATORY_TRACT | 0 refills | Status: DC

## 2018-08-19 ENCOUNTER — Ambulatory Visit: Payer: Self-pay | Admitting: Allergy and Immunology

## 2018-08-21 ENCOUNTER — Other Ambulatory Visit: Payer: Self-pay

## 2018-08-21 ENCOUNTER — Ambulatory Visit (INDEPENDENT_AMBULATORY_CARE_PROVIDER_SITE_OTHER): Payer: Medicaid Other | Admitting: Allergy and Immunology

## 2018-08-21 ENCOUNTER — Encounter: Payer: Self-pay | Admitting: Allergy and Immunology

## 2018-08-21 VITALS — BP 140/78 | HR 80 | Resp 14

## 2018-08-21 DIAGNOSIS — K219 Gastro-esophageal reflux disease without esophagitis: Secondary | ICD-10-CM | POA: Diagnosis not present

## 2018-08-21 DIAGNOSIS — J4489 Other specified chronic obstructive pulmonary disease: Secondary | ICD-10-CM

## 2018-08-21 DIAGNOSIS — J449 Chronic obstructive pulmonary disease, unspecified: Secondary | ICD-10-CM

## 2018-08-21 DIAGNOSIS — J3089 Other allergic rhinitis: Secondary | ICD-10-CM | POA: Diagnosis not present

## 2018-08-21 NOTE — Patient Instructions (Addendum)
  1. Continue Spiriva handihaler one inhaled capsule contents one time per day.  2. Continue Symbicort 160 - 2 inhalations 2 times per day with spacer  3. Continue Proair HFA and albuterol nebulization if needed.  4. Continue reflux treatment:   A. minimizecaffeine and chocolate consumption  B. omeprazole 40 mg 1 time per day in morning  5. Continue OTC Nasacort one spray each nostril 1-2 time per day  6. Can use over-the-counter antihistamine if needed  7.  Add Qvar 80 REDIHALER  - 2 inhalations 2 times a day to Symbicort during 'asthma flare up' or respiratory tract infections   8. Return in 6 months or earlier if problem  9. Obtain fall flu vaccine

## 2018-08-21 NOTE — Progress Notes (Signed)
[place] [facility] [place] [facility] [place]   Follow-up Note  Referring Provider: No ref. provider found Primary Provider: Maggie Schwalbe, PA-C Date of Office Visit: 08/21/2018  Subjective:   Bethany Sosa (DOB: 02-20-1956) is a 63 y.o. 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  Her last visit to this clinic was with Dr. Nelva Bush on 27 November 2017 at which point in time she appeared to have a respiratory tract infection requiring a antibiotic and systemic steroid.   Her respiratory tract has really done well since her last visit.  She informs me that she has not required a systemic steroid or an antibiotic to treat any type of respiratory tract issue during the interval.  Her bronchodilator requirement averages out from 0-1 time per day.  She continues to use a combination of Symbicort and Spiriva and a nasal steroid.  She believes that her reflux is under excellent control at this point in time while using a proton pump inhibitor and being very careful about caffeine consumption.  She has been having a significant amount of problems with her back and radiculopathy down her right leg and she is scheduled to see an orthopedic surgeon and apparently have an MRI.  Allergies as of 08/21/2018      Reactions   Codeine    GI issues      Medication List      albuterol (2.5 MG/3ML) 0.083% nebulizer solution Commonly known as:  PROVENTIL Take 2.5 mg by nebulization every 4 (four) hours as needed for wheezing or shortness of breath.   albuterol 108 (90 Base) MCG/ACT inhaler Commonly known as:  VENTOLIN HFA INHALE 2 PUFFS INTO THE LUNGS EVERY 4 HOURS AS NEEDED FOR WHEEZING OR SHORTNESS OF BREATH   amoxicillin-clavulanate 875-125 MG tablet Commonly known as:  Augmentin Take one tablet twice daily for 10 days  aspirin 81 MG EC tablet Take 81 mg by mouth daily. Swallow whole.   citalopram 20 MG tablet Commonly known as:  CELEXA Take 20 mg by mouth daily.   clopidogrel 75 MG tablet Commonly known as:  PLAVIX Take 75 mg by mouth daily.   fluticasone 110 MCG/ACT inhaler Commonly known as:  Flovent HFA INHALE 2 PUFFS BY MOUTH TWICE DAILY TO  PREVENT  COUGH  OR  WHEEZE.  RINSE  MOUTH  AFTER  USE.   gemfibrozil 600 MG tablet Commonly known as:  LOPID Take 600 mg by mouth 2 (two) times daily before a meal.   levETIRAcetam 1000 MG tablet Commonly known as:  Keppra Take 1 tablet (1,000 mg total) by mouth 2 (two) times daily.   levothyroxine 100 MCG tablet Commonly known as:  SYNTHROID Take 100 mcg by mouth daily.   meloxicam 15 MG tablet Commonly known as:  MOBIC Take 15 mg by mouth daily.   metoprolol tartrate 25 MG tablet Commonly known as:  LOPRESSOR Take 0.5 tablets (12.5 mg total) by mouth 2 (two) times daily.   nitroGLYCERIN 0.4 MG SL tablet Commonly known as:  NITROSTAT Place 0.4 mg under the tongue every 5 (five) minutes as needed for chest pain.   omeprazole 40 MG capsule Commonly known as:  PRILOSEC TAKE 1 CAPSULE BY MOUTH IN THE MORNING BEFORE BREAKFAST   omeprazole 40 MG capsule Commonly known as:  PRILOSEC TAKE 1 CAPSULE BY MOUTH  IN THE MORNING BEFORE BREAKFAST   PRESCRIPTION MEDICATION New seizure medication- patient unsure of name of medicine   ranitidine 300 MG tablet Commonly known as:  ZANTAC TAKE 1 TABLET BY MOUTH ONCE DAILY IN THE EVENING. NEEDS APPOINTMENT   rosuvastatin 40 MG tablet Commonly known as:  CRESTOR Take 40 mg by mouth daily.   Symbicort 160-4.5 MCG/ACT inhaler Generic drug:  budesonide-formoterol INHALE 2 PUFFS BY MOUTH TWICE DAILY TO  PREVENT  COUGH  OR  WHEEZE.  RINSE,  GARGLE  AND  SPIT  AFTER  USE.   tiotropium 18 MCG inhalation capsule Commonly known as:  Spiriva HandiHaler INHALE 1 PUFF BY MOUTH ONCE DAILY AS DIRECTED        Past Medical History:  Diagnosis Date  . Allergic rhinitis   . Asthma   . COPD (chronic obstructive pulmonary disease) ([place])   . Coronary artery disease   . History of hysterectomy   . Hyperthyroidism   . Seizures ([place])   . Stroke [facility])     Past Surgical History:  Procedure Laterality Date  . CORONARY ARTERY BYPASS GRAFT    . open heart surgery      Review of systems negative except as noted in HPI / PMHx or noted below:  Review of Systems  Constitutional: Negative.   HENT: Negative.   Eyes: Negative.   Respiratory: Negative.   Cardiovascular: Negative.   Gastrointestinal: Negative.   Genitourinary: Negative.   Musculoskeletal: Negative.   Skin: Negative.   Neurological: Negative.   Endo/Heme/Allergies: Negative.   Psychiatric/Behavioral: Negative.      Objective:   Vitals:   08/21/18 1052  BP: 140/78  Pulse: 80  Resp: 14  SpO2: 96%          Physical Exam Constitutional:      Appearance: She is not diaphoretic.     Comments: Obvious back pain with movement and sitting and continuous rubbing of right leg  HENT:     Head: Normocephalic.     Right Ear: Tympanic membrane, ear canal and external ear normal.     Left Ear: Tympanic membrane, ear canal and external ear normal.     Nose: Nose normal. No mucosal edema or rhinorrhea.     Mouth/Throat:     Pharynx: Uvula midline. No oropharyngeal exudate.  Eyes:     Conjunctiva/sclera: Conjunctivae normal.  Neck:     Thyroid: No thyromegaly.     Trachea: Trachea normal. No tracheal tenderness or tracheal deviation.  Cardiovascular:     Rate and Rhythm: Normal rate and regular rhythm.     Heart sounds: Normal heart sounds, S1 normal and S2 normal. No murmur.  Pulmonary:     Effort: No respiratory distress.     Breath sounds: Normal breath sounds. No stridor. No wheezing or rales.  Lymphadenopathy:     Head:     Right side of head: No tonsillar adenopathy.     Left side of head: No tonsillar adenopathy.      Cervical: No cervical adenopathy.  Skin:    Findings: No erythema or rash.     Nails: There is no clubbing.   Neurological:     Mental Status: She is alert.     Diagnostics:    Spirometry was performed and demonstrated an FEV1 of 1.09 at 58 % of predicted.   Assessment and Plan:   1. Asthma with COPD ([place])   2. Other allergic rhinitis   3. LPRD (laryngopharyngeal reflux disease)  1. Continue Spiriva handihaler one inhaled capsule contents one time per day.  2. Continue Symbicort 160 - 2 inhalations 2 times per day with spacer  3. Continue Proair HFA and albuterol nebulization if needed.  4. Continue reflux treatment:   A. minimize caffeine and chocolate consumption  B. omeprazole 40 mg 1 time per day in morning  5. Continue OTC Nasacort one spray each nostril 1-2 time per day  6. Can use over-the-counter antihistamine if needed  7.  Add Qvar 80 REDIHALER  - 2 inhalations 2 times a day to Symbicort during 'asthma flare up' or respiratory tract infections   8. Return in 6 months or earlier if problem  9. Obtain fall flu vaccine  Bethany Sosa appears to be doing relatively well regarding her respiratory tract and she will remain on a combination of anti-inflammatory agents for her airway and therapy directed against reflux.  Assuming she continues to do well with this plan I will see her back in this clinic in 6 months or earlier if there is a problem.  Obviously she needs to take care of her right lower extremity radiculopathy as this appears to be significantly impacting her ability to ambulate.  I will see her back in this clinic in 6 months or earlier if there is a problem.  Allena Katz, MD Allergy / Immunology [place]

## 2018-08-22 ENCOUNTER — Encounter: Payer: Self-pay | Admitting: Allergy and Immunology

## 2018-08-27 ENCOUNTER — Other Ambulatory Visit: Payer: Self-pay

## 2018-08-27 ENCOUNTER — Telehealth (INDEPENDENT_AMBULATORY_CARE_PROVIDER_SITE_OTHER): Payer: Medicaid Other | Admitting: Neurology

## 2018-08-27 DIAGNOSIS — Z8673 Personal history of transient ischemic attack (TIA), and cerebral infarction without residual deficits: Secondary | ICD-10-CM

## 2018-08-27 DIAGNOSIS — G40209 Localization-related (focal) (partial) symptomatic epilepsy and epileptic syndromes with complex partial seizures, not intractable, without status epilepticus: Secondary | ICD-10-CM | POA: Diagnosis not present

## 2018-08-27 MED ORDER — LEVETIRACETAM 1000 MG PO TABS: 1000.0000 mg | ORAL_TABLET | Freq: Two times a day (BID) | ORAL | 3 refills | Status: DC

## 2018-08-27 NOTE — Progress Notes (Signed)
Virtual Visit via Telephone Note The purpose of this virtual visit is to provide medical care while limiting exposure to the novel coronavirus.    Consent was obtained for phone visit:  Yes.   Answered questions that patient had about telehealth interaction:  Yes.   I discussed the limitations, risks, security and privacy concerns of performing an evaluation and management service by telephone. I also discussed with the patient that there may be a patient responsible charge related to this service. The patient expressed understanding and agreed to proceed.  Pt location: Home Physician Location: office Name of referring provider:  Janie Morning, DO I connected with .Bethany Sosa at patients initiation/request on 08/27/2018 at  9:00 AM EDT by telephone and verified that I am speaking with the correct person using two identifiers.  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 Her daughter Bethany Sosa is present during the phone visit to provide additional information. Since her last visit, she had one brief episode where her right hand was shaking when she was adjusting the curtain rod, it stopped when she put the rod down. She had another incident 1-2 months ago while helping her mother pack, she does not remember if there was shaking, she just knows she had to sit down and told her mother she felt she was going to have a seizure. She sat down and the sensation stopped without any progression. Her daughter has not witnessed any jerking/shaking episodes, no staring/unresponsive episodes. She denies any gaps in time, olfactory/gustatory hallucinations, focal numbness/tingling/weakness. No headaches, dizziness, vision changes, no falls. No side effects on Levetiracetam 1000mg  BID. She is on aspirin and Plavix for secondary stroke prevention. She feels her memory is good, she continues to drive without  getting lost, no missed bills or medications. Her daughter has no memory concerns at this time.   History on Initial Assessment 08/14/2016: This is a pleasant 63 yo RH woman with a history of hyperlipidemia, CAD s/p CABG, bilateral carotid artery stenosis (?occlusion), left frontal stroke in February 2018, presenting for seizures. Her granddaughter reports the first episode occurred in January 2018, she witnessed upper body shaking with difficulty speaking lasting a few minutes, then she was back to baseline. On 04/22/16, she felt that "something was wrong with me." She denied any focal weakness. Per PCP notes, she was "saying weird off the wall and repeating herself." She was admitted to [facility] for 3 days where she saw Teleneurology and was diagnosed with a stroke. She was reported to have complete blockage of both carotid arteries. Her family reports that after the stroke, she started having episodes of her right leg giving out on her. One time in March while at the store, she was using her walker and told her daughter she needed to sit down. She apparently passed out and slumped to her right side, unconscious for a couple of seconds. No shaking noted by daughter. Her granddaughter reports an episode at the kitchen table where she had shaking of the right leg and arm for a split second. She felt this coming on, like she could not work her leg. Her granddaughter reported she would not talk until the shaking stopped, then denied any post-event weakness or confusion. It appears she would have these right-sided episodes every couple of weeks. No associated tongue bite or incontinence. The last one occurred 06/23/16, she was not feeling  good that day and was helped to the bedside commode. She used the commode then her head went back and she passed out for a few seconds. When she came to, EMS was around her. She did not recall having any focal weakness. She had an MRI without contrast on 06/24/16 which I  personally reviewed, there was some continued restricted diffusion in the left ACA territory similar to prior MRI in February, extending to the level of the anterior genu of the corpus callosum. No new infarcts. The temporal lobes were symmetric. She was discharged home on Keppra 500mg  BID. Since then, her family denies any further right-sided symptoms or loss of consciousness. She denies any side effects on the Keppra. She denies any olfactory/gustatory hallucinations, deja vu, rising epigastric sensation, gaps in time, staring/unresponsive episodes. She had memory changes after the stroke and reports there are still some bits and pieces that she cannot remember. Prior to the stroke, she was driving without difficulties, no missed bills payments (her daughter has been in charge of finances since the stroke). Her daughter has been helping with medications since the stroke, prior to this she was forgetting every now and then.   She denies any headaches, dizziness, diplopia, dysarthria/dysphagia, neck/back pain, bowel/bladder dysfunction. It appears she was complaining of cough in September 2017 and had a chest xray which showed mild compression deformities in the mid-thoracic spine of indeterminate age. She was reporting back pain to her PCP and was referred to Ortho. She currently denies any back pain. She lives with her children. Her father had Alzheimer's disease. She denies any history of alcohol use.  She had a normal birth and early development.  There is no history of febrile convulsions, CNS infections such as meningitis/encephalitis, significant traumatic brain injury, neurosurgical procedures, or family history of seizures.    Observations/Objective:  Limited due to nature of phone visit. Patient is awake, alert, able to answer questions appropriately with no dysarthria noted.   Assessment and Plan:   This is a pleasant 63 yo RH woman with a history of hyperlipidemia, CAD s/p CABG, bilateral  carotid artery stenosis (?occlusion), left ACA stroke in February 2018, with subsequent focal seizures. She describes a sensation of inability to control her right leg, with right leg and arm shaking. Family also described episodes of loss of consciousness with no clear shaking. EEG showed occasional left frontotemporal slowing, no epileptiform discharges. She had 2 brief episodes of possible seizure in the past 8 months, we agreed to continue current dose of Levetiracetam 1000mg  BID. If episodes increase in frequency, we may increase dose further. She is aware of [place] driving laws to stop driving until 6 months seizure-free. Continue aspirin, Plavix, control of vascular risk factors for secondary stroke prevention. They know to go to the ER for any sudden change in symptoms. Follow-up in 6-8 months, they know to call for any changes.   Follow Up Instructions:   -I discussed the assessment and treatment plan with the patient. The patient was provided an opportunity to ask questions and all were answered. The patient agreed with the plan and demonstrated an understanding of the instructions.   The patient was advised to call back or seek an in-person evaluation if the symptoms worsen or if the condition fails to improve as anticipated.    Total Time spent in visit with the patient was:  10 minutes, of which 100% of the time was spent in counseling and/or coordinating care on the above.   Pt understands  and agrees with the plan of care outlined.     Cameron Sprang, MD

## 2018-08-28 ENCOUNTER — Ambulatory Visit: Payer: Medicaid Other | Admitting: Neurology

## 2018-09-05 ENCOUNTER — Ambulatory Visit: Payer: Medicaid Other | Admitting: Neurology

## 2018-10-12 ENCOUNTER — Other Ambulatory Visit: Payer: Self-pay | Admitting: Allergy and Immunology

## 2018-10-15 DIAGNOSIS — M80059A Age-related osteoporosis with current pathological fracture, unspecified femur, initial encounter for fracture: Secondary | ICD-10-CM | POA: Insufficient documentation

## 2018-10-15 DIAGNOSIS — M5430 Sciatica, unspecified side: Secondary | ICD-10-CM | POA: Insufficient documentation

## 2018-10-15 DIAGNOSIS — Q762 Congenital spondylolisthesis: Secondary | ICD-10-CM | POA: Insufficient documentation

## 2018-10-26 ENCOUNTER — Other Ambulatory Visit: Payer: Self-pay | Admitting: Allergy and Immunology

## 2018-10-26 ENCOUNTER — Other Ambulatory Visit: Payer: Self-pay | Admitting: Family Medicine

## 2018-11-21 DIAGNOSIS — R600 Localized edema: Secondary | ICD-10-CM | POA: Insufficient documentation

## 2018-11-29 DIAGNOSIS — M1611 Unilateral primary osteoarthritis, right hip: Secondary | ICD-10-CM | POA: Insufficient documentation

## 2019-01-18 ENCOUNTER — Other Ambulatory Visit: Payer: Self-pay | Admitting: Allergy and Immunology

## 2019-02-10 ENCOUNTER — Other Ambulatory Visit: Payer: Self-pay

## 2019-02-10 MED ORDER — OMEPRAZOLE 40 MG PO CPDR: DELAYED_RELEASE_CAPSULE | ORAL | 0 refills | Status: DC

## 2019-02-20 ENCOUNTER — Other Ambulatory Visit: Payer: Self-pay

## 2019-02-20 ENCOUNTER — Ambulatory Visit (INDEPENDENT_AMBULATORY_CARE_PROVIDER_SITE_OTHER): Payer: Medicare HMO | Admitting: Allergy and Immunology

## 2019-02-20 ENCOUNTER — Encounter: Payer: Self-pay | Admitting: Allergy and Immunology

## 2019-02-20 VITALS — BP 122/80 | HR 100 | Temp 97.2°F | Resp 20 | Ht 59.0 in | Wt 212.4 lb

## 2019-02-20 DIAGNOSIS — J449 Chronic obstructive pulmonary disease, unspecified: Secondary | ICD-10-CM | POA: Diagnosis not present

## 2019-02-20 DIAGNOSIS — J4489 Other specified chronic obstructive pulmonary disease: Secondary | ICD-10-CM

## 2019-02-20 DIAGNOSIS — K219 Gastro-esophageal reflux disease without esophagitis: Secondary | ICD-10-CM | POA: Diagnosis not present

## 2019-02-20 DIAGNOSIS — J3089 Other allergic rhinitis: Secondary | ICD-10-CM

## 2019-02-20 MED ORDER — ALBUTEROL SULFATE HFA 108 (90 BASE) MCG/ACT IN AERS: INHALATION_SPRAY | RESPIRATORY_TRACT | 1 refills | Status: DC

## 2019-02-20 NOTE — Progress Notes (Signed)
[place] [facility] [place] [facility] [place]   Follow-up Note  Referring Provider: Maggie Schwalbe, PA-C Primary Provider: Maggie Schwalbe, PA-C Date of Office Visit: 02/20/2019  Subjective:   Bethany Sosa (DOB: April 07, 1955) is a 63 y.o. 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  Her last visit in this clinic was 21 August 2018.  She has really done well with her airway and it does not appear as though she has required a systemic steroid or an antibiotic for any type of airway issue and she rarely uses a short acting bronchodilator.  She can exert herself without much problem.  She has had very little issues with her nose.  She has been consistently using Spiriva and Symbicort 1 time per day and occasionally Nasacort.  It should be noted that over the course of the past 6 months she has received 3 systemic steroid injections, 2 in her back, and one in her hip.  Her reflux is under very good control at this point in time while using omeprazole.  She still continues to consume caffeine and a minimal amount of chocolate.  Allergies as of 02/20/2019      Reactions   Codeine    GI issues      Medication List      albuterol (2.5 MG/3ML) 0.083% nebulizer solution Commonly known as: PROVENTIL Take 2.5 mg by nebulization every 4 (four) hours as needed for wheezing or shortness of breath.   albuterol 108 (90 Base) MCG/ACT inhaler Commonly known as: VENTOLIN HFA INHALE 2 PUFFS INTO THE LUNGS EVERY 4 HOURS AS NEEDED FOR WHEEZING OR SHORTNESS OF BREATH   aspirin 81 MG EC tablet Take 81 mg by mouth daily. Swallow whole.   citalopram 20 MG tablet Commonly known as: CELEXA Take 20 mg by mouth daily.   clopidogrel 75 MG tablet Commonly known as: PLAVIX Take 75 mg by mouth daily.   Flovent HFA 110 MCG/ACT  inhaler Generic drug: fluticasone INHALE 2 PUFFS BY MOUTH TWICE DAILY TO  PREVENT  COUGH  OR  WHEEZE.  RINSE  MOUTH  AFTER  USE.   gemfibrozil 600 MG tablet Commonly known as: LOPID Take 600 mg by mouth 2 (two) times daily before a meal.   hydrOXYzine 25 MG tablet Commonly known as: ATARAX/VISTARIL TAKE 1 TO 2 TABLETS BY MOUTH EVERY 6 HOURS AS NEEDED FOR ANXIETY   levETIRAcetam 1000 MG tablet Commonly known as: Keppra Take 1 tablet (1,000 mg total) by mouth 2 (two) times daily.   levothyroxine 100 MCG tablet Commonly known as: SYNTHROID Take 100 mcg by mouth daily.   meloxicam 15 MG tablet Commonly known as: MOBIC Take 15 mg by mouth daily.   nitroGLYCERIN 0.4 MG SL tablet Commonly known as: NITROSTAT Place 0.4 mg under the tongue every 5 (five) minutes as needed for chest pain.   omeprazole 40 MG capsule Commonly known as: PRILOSEC Take one capsule by mouth every morning as directed.   rosuvastatin 40 MG tablet Commonly known as: CRESTOR Take 40 mg by mouth daily.   Spiriva HandiHaler 18 MCG inhalation capsule Generic drug: tiotropium INHALE 1 PUFF BY MOUTH ONCE DAILY AS DIRECTED.   Symbicort 160-4.5 MCG/ACT inhaler Generic drug: budesonide-formoterol INHALE 2 PUFFS WITH SPACER TWICE DAILY TO PREVENT COUGH OR WHEEZE, RINSE, GARGLE, AND SPIT AFTER USE  Past Medical History:  Diagnosis Date  . Allergic rhinitis   . Asthma   . COPD (chronic obstructive pulmonary disease) ([place])   . Coronary artery disease   . History of hysterectomy   . Hyperthyroidism   . Seizures ([place])   . Stroke [facility])     Past Surgical History:  Procedure Laterality Date  . CORONARY ARTERY BYPASS GRAFT    . open heart surgery      Review of systems negative except as noted in HPI / PMHx or noted below:  Review of Systems  Constitutional: Negative.   HENT: Negative.   Eyes: Negative.   Respiratory: Negative.   Cardiovascular: Negative.   Gastrointestinal: Negative.    Genitourinary: Negative.   Musculoskeletal: Negative.   Skin: Negative.   Neurological: Negative.   Endo/Heme/Allergies: Negative.   Psychiatric/Behavioral: Negative.      Objective:   Vitals:   02/20/19 1120  BP: 122/80  Pulse: 100  Resp: 20  Temp: (!) 97.2 F (36.2 C)  SpO2: 95%   Height: 4\' 11"  (149.9 cm)  Weight: 212 lb 6.4 oz (96.3 kg)   Physical Exam Constitutional:      Appearance: She is not diaphoretic.  HENT:     Head: Normocephalic.     Right Ear: Tympanic membrane, ear canal and external ear normal.     Left Ear: Tympanic membrane, ear canal and external ear normal.     Nose: Nose normal. No mucosal edema or rhinorrhea.     Mouth/Throat:     Pharynx: Uvula midline. No oropharyngeal exudate.  Eyes:     Conjunctiva/sclera: Conjunctivae normal.  Neck:     Thyroid: No thyromegaly.     Trachea: Trachea normal. No tracheal tenderness or tracheal deviation.  Cardiovascular:     Rate and Rhythm: Normal rate and regular rhythm.     Heart sounds: Normal heart sounds, S1 normal and S2 normal. No murmur.  Pulmonary:     Effort: No respiratory distress.     Breath sounds: Normal breath sounds. No stridor. No wheezing or rales.  Lymphadenopathy:     Head:     Right side of head: No tonsillar adenopathy.     Left side of head: No tonsillar adenopathy.     Cervical: No cervical adenopathy.  Skin:    Findings: No erythema or rash.     Nails: There is no clubbing.  Neurological:     Mental Status: She is alert.     Diagnostics:    Spirometry was performed and demonstrated an FEV1 of 1.30 at 66 % of predicted.   Assessment and Plan:   1. Asthma with COPD ([place])   2. Other allergic rhinitis   3. LPRD (laryngopharyngeal reflux disease)     1. Continue a combination of the following:   A. Spiriva handihaler one inhaled capsule contents one time per day  B. Symbicort 160 - 2 inhalations 1 time per day with spacer  2. Continue reflux treatment:   A.  Minimize caffeine and chocolate consumption  B. omeprazole 40 mg 1 time per day in morning  3. Continue OTC Nasacort one spray each nostril 1-2 time per day during periods of upper airway symptoms  4. Continue Proair HFA and albuterol nebulization if needed.  5. Continue over-the-counter antihistamine if needed  6.  "Action Plan" for flare up:   A. Increase Symbicort to 2 inhalations 2 times per day  B. Add Qvar 80 REDIHALER  - 2 inhalations 2 times a  day   C. Use Proair HFA if needed  7. Return in 6 months or earlier if problem  8. Obtain flu vaccine and COVID vaccine  [name] has done relatively well on her plan which includes anti-inflammatory agents for airway and therapy directed against reflux. It should be noted that some of her respiratory improvement is probably on the basis of receiving systemic steroids for her back and hip issue over the course of the past 6 months.   Assuming she continues to do well with this plan I will see her back in this clinic in 6 months or earlier if there is a problem.    Allena Katz, MD Allergy / Immunology [place]

## 2019-02-20 NOTE — Patient Instructions (Addendum)
  1. Continue a combination of the following:   A. Spiriva handihaler one inhaled capsule contents one time per day  B. Symbicort 160 - 2 inhalations 1 time per day with spacer  2. Continue reflux treatment:   A. Minimize caffeine and chocolate consumption  B. omeprazole 40 mg 1 time per day in morning  3. Continue OTC Nasacort one spray each nostril 1-2 time per day during periods of upper airway symptoms  4. Continue Proair HFA and albuterol nebulization if needed.  5. Continue over-the-counter antihistamine if needed  6.  "Action Plan" for flare up:   A. Increase Symbicort to 2 inhalations 2 times per day  B. Add Qvar 80 REDIHALER  - 2 inhalations 2 times a day   C. Use Proair HFA if needed  7. Return in 6 months or earlier if problem  8. Obtain flu vaccine and COVID vaccine

## 2019-02-24 ENCOUNTER — Encounter: Payer: Self-pay | Admitting: Allergy and Immunology

## 2019-03-13 DIAGNOSIS — I361 Nonrheumatic tricuspid (valve) insufficiency: Secondary | ICD-10-CM | POA: Diagnosis not present

## 2019-03-13 DIAGNOSIS — I6389 Other cerebral infarction: Secondary | ICD-10-CM | POA: Diagnosis not present

## 2019-03-13 DIAGNOSIS — U071 COVID-19: Secondary | ICD-10-CM

## 2019-03-13 DIAGNOSIS — R55 Syncope and collapse: Secondary | ICD-10-CM

## 2019-03-13 DIAGNOSIS — R4020 Unspecified coma: Secondary | ICD-10-CM | POA: Diagnosis not present

## 2019-03-13 DIAGNOSIS — E785 Hyperlipidemia, unspecified: Secondary | ICD-10-CM | POA: Diagnosis not present

## 2019-03-13 DIAGNOSIS — I10 Essential (primary) hypertension: Secondary | ICD-10-CM

## 2019-03-13 DIAGNOSIS — I2510 Atherosclerotic heart disease of native coronary artery without angina pectoris: Secondary | ICD-10-CM

## 2019-03-13 DIAGNOSIS — G40909 Epilepsy, unspecified, not intractable, without status epilepticus: Secondary | ICD-10-CM | POA: Diagnosis not present

## 2019-03-14 DIAGNOSIS — I739 Peripheral vascular disease, unspecified: Secondary | ICD-10-CM | POA: Diagnosis not present

## 2019-03-14 DIAGNOSIS — U071 COVID-19: Secondary | ICD-10-CM | POA: Diagnosis not present

## 2019-03-14 DIAGNOSIS — E785 Hyperlipidemia, unspecified: Secondary | ICD-10-CM | POA: Diagnosis not present

## 2019-03-14 DIAGNOSIS — R55 Syncope and collapse: Secondary | ICD-10-CM | POA: Diagnosis not present

## 2019-03-14 DIAGNOSIS — I10 Essential (primary) hypertension: Secondary | ICD-10-CM | POA: Diagnosis not present

## 2019-03-14 DIAGNOSIS — I2510 Atherosclerotic heart disease of native coronary artery without angina pectoris: Secondary | ICD-10-CM | POA: Diagnosis not present

## 2019-03-14 DIAGNOSIS — R4020 Unspecified coma: Secondary | ICD-10-CM | POA: Diagnosis not present

## 2019-03-15 DIAGNOSIS — I10 Essential (primary) hypertension: Secondary | ICD-10-CM | POA: Diagnosis not present

## 2019-03-15 DIAGNOSIS — R55 Syncope and collapse: Secondary | ICD-10-CM | POA: Diagnosis not present

## 2019-03-15 DIAGNOSIS — U071 COVID-19: Secondary | ICD-10-CM | POA: Diagnosis not present

## 2019-03-16 DIAGNOSIS — I2510 Atherosclerotic heart disease of native coronary artery without angina pectoris: Secondary | ICD-10-CM | POA: Diagnosis not present

## 2019-03-16 DIAGNOSIS — I10 Essential (primary) hypertension: Secondary | ICD-10-CM | POA: Diagnosis not present

## 2019-03-16 DIAGNOSIS — R55 Syncope and collapse: Secondary | ICD-10-CM | POA: Diagnosis not present

## 2019-03-16 DIAGNOSIS — U071 COVID-19: Secondary | ICD-10-CM | POA: Diagnosis not present

## 2019-03-17 DIAGNOSIS — E785 Hyperlipidemia, unspecified: Secondary | ICD-10-CM | POA: Diagnosis not present

## 2019-03-17 DIAGNOSIS — I739 Peripheral vascular disease, unspecified: Secondary | ICD-10-CM | POA: Diagnosis not present

## 2019-03-17 DIAGNOSIS — U071 COVID-19: Secondary | ICD-10-CM | POA: Diagnosis not present

## 2019-03-17 DIAGNOSIS — I10 Essential (primary) hypertension: Secondary | ICD-10-CM | POA: Diagnosis not present

## 2019-03-17 DIAGNOSIS — R4020 Unspecified coma: Secondary | ICD-10-CM | POA: Diagnosis not present

## 2019-03-17 DIAGNOSIS — R55 Syncope and collapse: Secondary | ICD-10-CM | POA: Diagnosis not present

## 2019-03-17 DIAGNOSIS — I2510 Atherosclerotic heart disease of native coronary artery without angina pectoris: Secondary | ICD-10-CM | POA: Diagnosis not present

## 2019-03-18 DIAGNOSIS — U071 COVID-19: Secondary | ICD-10-CM | POA: Diagnosis not present

## 2019-03-18 DIAGNOSIS — I10 Essential (primary) hypertension: Secondary | ICD-10-CM | POA: Diagnosis not present

## 2019-03-18 DIAGNOSIS — R55 Syncope and collapse: Secondary | ICD-10-CM | POA: Diagnosis not present

## 2019-03-19 DIAGNOSIS — R55 Syncope and collapse: Secondary | ICD-10-CM | POA: Diagnosis not present

## 2019-03-19 DIAGNOSIS — I10 Essential (primary) hypertension: Secondary | ICD-10-CM | POA: Diagnosis not present

## 2019-03-19 DIAGNOSIS — U071 COVID-19: Secondary | ICD-10-CM | POA: Diagnosis not present

## 2019-03-31 ENCOUNTER — Telehealth: Payer: Medicaid Other | Admitting: Neurology

## 2019-05-18 ENCOUNTER — Other Ambulatory Visit: Payer: Self-pay | Admitting: Allergy and Immunology

## 2019-07-25 ENCOUNTER — Other Ambulatory Visit: Payer: Self-pay | Admitting: Allergy and Immunology

## 2019-08-21 ENCOUNTER — Encounter: Payer: Self-pay | Admitting: Allergy and Immunology

## 2019-08-21 ENCOUNTER — Other Ambulatory Visit: Payer: Self-pay

## 2019-08-21 ENCOUNTER — Ambulatory Visit (INDEPENDENT_AMBULATORY_CARE_PROVIDER_SITE_OTHER): Payer: Medicare HMO | Admitting: Allergy and Immunology

## 2019-08-21 VITALS — BP 148/92 | HR 90 | Resp 21

## 2019-08-21 DIAGNOSIS — J3089 Other allergic rhinitis: Secondary | ICD-10-CM | POA: Diagnosis not present

## 2019-08-21 DIAGNOSIS — K219 Gastro-esophageal reflux disease without esophagitis: Secondary | ICD-10-CM

## 2019-08-21 DIAGNOSIS — J449 Chronic obstructive pulmonary disease, unspecified: Secondary | ICD-10-CM | POA: Diagnosis not present

## 2019-08-21 NOTE — Patient Instructions (Signed)
°  1. Continue a combination of the following:   A. Breztri - 2 inhalation 1-2 times per day with spacer  2. Continue reflux treatment:   A. Minimize caffeine and chocolate consumption  B. omeprazole 40 mg 1 time per day in morning  3. Continue OTC Nasacort one spray each nostril 1-2 time per day during periods of upper airway symptoms  4. Continue Proair HFA and albuterol nebulization if needed.  5. Continue over-the-counter antihistamine if needed  6.  "Action Plan" for flare up:   A. Continue Breztri  B. Add Qvar 80 REDIHALER  - 2 inhalations 2 times a day   C. Use Proair HFA if needed  7. Return in 6 months or earlier if problem

## 2019-08-21 NOTE — Progress Notes (Signed)
[place] [facility] [place] [facility] [place]   Follow-up Note  Referring Provider: Maggie Schwalbe, PA-C Primary Provider: Maggie Schwalbe, PA-C Date of Office Visit: 08/21/2019  Subjective:   Bethany Sosa (DOB: [birth year]) is a 64 y.o. 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  Her last visit to this clinic was 20 February 2019.  Overall she has done well with her airway while consistently using a combination of inhalers including an inhaled steroid and a long-acting bronchodilator and a anticholinergic agent.  She has not required a systemic steroid or an antibiotic for any type of airway issue.  She does not exert herself as she will soon require a right hip replacement.  She has had no problems with her upper airways while occasionally using some Nasacort.  Her reflux has been under excellent control at this point in time.  She has received 2 Pfizer Covid vaccinations.  Allergies as of 08/21/2019      Reactions   Codeine    GI issues      Medication List      albuterol (2.5 MG/3ML) 0.083% nebulizer solution Commonly known as: PROVENTIL Take 2.5 mg by nebulization every 4 (four) hours as needed for wheezing or shortness of breath.   albuterol 108 (90 Base) MCG/ACT inhaler Commonly known as: VENTOLIN HFA Can inhale two puffs every four to six hours as needed for cough or wheeze.   aspirin 81 MG EC tablet Take 81 mg by mouth daily. Swallow whole.   citalopram 20 MG tablet Commonly known as: CELEXA Take 20 mg by mouth daily.   clopidogrel 75 MG tablet Commonly known as: PLAVIX Take 75 mg by mouth daily.   Flovent HFA 110 MCG/ACT inhaler Generic drug: fluticasone INHALE 2 PUFFS BY MOUTH TWICE DAILY TO  PREVENT  COUGH  OR  WHEEZE.  RINSE  MOUTH  AFTER  USE.   gabapentin 300 MG  capsule Commonly known as: NEURONTIN   gemfibrozil 600 MG tablet Commonly known as: LOPID Take 600 mg by mouth 2 (two) times daily before a meal.   hydrOXYzine 25 MG tablet Commonly known as: ATARAX/VISTARIL TAKE 1 TO 2 TABLETS BY MOUTH EVERY 6 HOURS AS NEEDED FOR ANXIETY   levETIRAcetam 1000 MG tablet Commonly known as: Keppra Take 1 tablet (1,000 mg total) by mouth 2 (two) times daily.   levothyroxine 100 MCG tablet Commonly known as: SYNTHROID Take 100 mcg by mouth daily.   meloxicam 15 MG tablet Commonly known as: MOBIC Take 15 mg by mouth daily.   metoprolol tartrate 25 MG tablet Commonly known as: LOPRESSOR   nitroGLYCERIN 0.4 MG SL tablet Commonly known as: NITROSTAT Place 0.4 mg under the tongue every 5 (five) minutes as needed for chest pain.   omeprazole 40 MG capsule Commonly known as: PRILOSEC TAKE 1 CAPSULE BY MOUTH IN THE MORNING AS DIRECTED   Qvar RediHaler 80 MCG/ACT inhaler Generic drug: beclomethasone Inhale two doses twice daily during asthma flare-up as directed.  Rinse, gargle and spit after use.   rosuvastatin 40 MG tablet Commonly known as: CRESTOR Take 40 mg by mouth daily.   Spiriva HandiHaler 18 MCG inhalation capsule Generic drug: tiotropium INHALE 1 PUFF BY MOUTH ONCE DAILY AS DIRECTED.   Symbicort 160-4.5 MCG/ACT inhaler Generic drug: budesonide-formoterol INHALE 2 PUFFS WITH SPACER TWICE DAILY  TO PREVENT COUGH OR WHEEZING. RINSE, GARGLE AND SPIT AFTER USE       Past Medical History:  Diagnosis Date  . Allergic rhinitis   . Asthma   . COPD (chronic obstructive pulmonary disease) ([place])   . Coronary artery disease   . History of hysterectomy   . Hyperthyroidism   . Seizures ([place])   . Stroke [facility])     Past Surgical History:  Procedure Laterality Date  . CORONARY ARTERY BYPASS GRAFT    . open heart surgery      Review of systems negative except as noted in HPI / PMHx or noted below:  Review of Systems  Constitutional:  Negative.   HENT: Negative.   Eyes: Negative.   Respiratory: Negative.   Cardiovascular: Negative.   Gastrointestinal: Negative.   Genitourinary: Negative.   Musculoskeletal: Negative.   Skin: Negative.   Neurological: Negative.   Endo/Heme/Allergies: Negative.   Psychiatric/Behavioral: Negative.      Objective:   Vitals:   08/21/19 1102  BP: (!) 148/92  Pulse: 90  Resp: (!) 21  SpO2: 97%          Physical Exam Constitutional:      Appearance: She is not diaphoretic.  HENT:     Head: Normocephalic.     Right Ear: Tympanic membrane, ear canal and external ear normal.     Left Ear: Tympanic membrane, ear canal and external ear normal.     Nose: Nose normal. No mucosal edema or rhinorrhea.     Mouth/Throat:     Pharynx: Uvula midline. No oropharyngeal exudate.  Eyes:     Conjunctiva/sclera: Conjunctivae normal.  Neck:     Thyroid: No thyromegaly.     Trachea: Trachea normal. No tracheal tenderness or tracheal deviation.  Cardiovascular:     Rate and Rhythm: Normal rate and regular rhythm.     Heart sounds: Normal heart sounds, S1 normal and S2 normal. No murmur heard.   Pulmonary:     Effort: No respiratory distress.     Breath sounds: Normal breath sounds. No stridor. No wheezing or rales.  Lymphadenopathy:     Head:     Right side of head: No tonsillar adenopathy.     Left side of head: No tonsillar adenopathy.     Cervical: No cervical adenopathy.  Skin:    Findings: No erythema or rash.     Nails: There is no clubbing.  Neurological:     Mental Status: She is alert.     Diagnostics:    Spirometry was performed and demonstrated an FEV1 of 1.02 at 50 % of predicted.  Assessment and Plan:   1. Asthma with COPD ([place])   2. Other allergic rhinitis   3. LPRD (laryngopharyngeal reflux disease)     1. Continue a combination of the following:   A. Breztri - 2 inhalation 1-2 times per day with spacer  2. Continue reflux treatment:   A. Minimize  caffeine and chocolate consumption  B. omeprazole 40 mg 1 time per day in morning  3. Continue OTC Nasacort one spray each nostril 1-2 time per day during periods of upper airway symptoms  4. Continue Proair HFA and albuterol nebulization if needed.  5. Continue over-the-counter antihistamine if needed  6.  "Action Plan" for flare up:   A. Continue Breztri  B. Add Qvar 80 REDIHALER  - 2 inhalations 2 times a day   C. Use Proair HFA if needed  7. Return in 6 months  or earlier if problem  We will now switch Cindy to a triple inhaler to replace her two separate inhalers and she will continue on therapy for reflux as noted above and assuming she continues to do well on this plan I will just see her back in this clinic in 6 months or earlier if there is a problem.  Allena Katz, MD Allergy / Immunology [place]

## 2019-08-25 ENCOUNTER — Encounter: Payer: Self-pay | Admitting: Allergy and Immunology

## 2019-09-06 ENCOUNTER — Other Ambulatory Visit: Payer: Self-pay | Admitting: Neurology

## 2019-09-09 DIAGNOSIS — Z96649 Presence of unspecified artificial hip joint: Secondary | ICD-10-CM | POA: Insufficient documentation

## 2019-09-12 ENCOUNTER — Other Ambulatory Visit: Payer: Self-pay | Admitting: Neurology

## 2019-09-19 ENCOUNTER — Telehealth: Payer: Self-pay | Admitting: Neurology

## 2019-09-19 ENCOUNTER — Other Ambulatory Visit: Payer: Self-pay

## 2019-09-19 MED ORDER — LEVETIRACETAM 1000 MG PO TABS: 1000.0000 mg | ORAL_TABLET | Freq: Two times a day (BID) | ORAL | 1 refills | Status: DC

## 2019-09-19 MED ORDER — LEVETIRACETAM 1000 MG PO TABS: 1000.0000 mg | ORAL_TABLET | Freq: Two times a day (BID) | ORAL | 0 refills | Status: DC

## 2019-09-19 NOTE — Telephone Encounter (Signed)
Patient needs refill of Keppra sent to [facility] on [address] in [place]. Patient only has a couple of pills left.

## 2019-09-19 NOTE — Telephone Encounter (Signed)
Ok to send refills, she has to call to make appt, thanks

## 2019-09-19 NOTE — Telephone Encounter (Signed)
Spoke with the patient and scheduled a follow up for 04/09/20. She would like for her prescription to be sent  to her pharmacy.

## 2019-09-19 NOTE — Telephone Encounter (Signed)
Left message with pts daughter, Darrick Penna, that [place] refill being sent to pharmacy but that pt must be seen for f/u visit for further refills, she verbalized understanding.

## 2019-09-19 NOTE — Telephone Encounter (Signed)
-----   Message from Alison Murray, RN sent at 09/19/2019  9:44 AM EDT ----- Regarding: f/u Can you please call pt and schedule f/u appt? Thanks

## 2019-09-19 NOTE — Telephone Encounter (Signed)
Appt made for f/u Jan 2022, pt aware of need to keep appt for cont refills.

## 2020-02-16 ENCOUNTER — Encounter: Payer: Medicare HMO | Admitting: Allergy and Immunology

## 2020-02-17 NOTE — Progress Notes (Signed)
This encounter was created in error - please disregard.

## 2020-03-07 DIAGNOSIS — Z23 Encounter for immunization: Secondary | ICD-10-CM | POA: Insufficient documentation

## 2020-03-07 DIAGNOSIS — S1191XA Laceration without foreign body of unspecified part of neck, initial encounter: Secondary | ICD-10-CM | POA: Insufficient documentation

## 2020-03-31 DIAGNOSIS — R5383 Other fatigue: Secondary | ICD-10-CM | POA: Insufficient documentation

## 2020-03-31 DIAGNOSIS — D500 Iron deficiency anemia secondary to blood loss (chronic): Secondary | ICD-10-CM | POA: Insufficient documentation

## 2020-04-09 ENCOUNTER — Ambulatory Visit: Payer: Medicare HMO | Admitting: Neurology

## 2020-05-04 DIAGNOSIS — U071 COVID-19: Secondary | ICD-10-CM | POA: Insufficient documentation

## 2020-05-04 DIAGNOSIS — Z951 Presence of aortocoronary bypass graft: Secondary | ICD-10-CM | POA: Insufficient documentation

## 2020-05-04 DIAGNOSIS — I255 Ischemic cardiomyopathy: Secondary | ICD-10-CM | POA: Insufficient documentation

## 2020-05-04 DIAGNOSIS — Z7982 Long term (current) use of aspirin: Secondary | ICD-10-CM | POA: Insufficient documentation

## 2020-05-07 DIAGNOSIS — D689 Coagulation defect, unspecified: Secondary | ICD-10-CM | POA: Insufficient documentation

## 2020-05-07 DIAGNOSIS — N2581 Secondary hyperparathyroidism of renal origin: Secondary | ICD-10-CM | POA: Insufficient documentation

## 2020-05-07 DIAGNOSIS — T782XXA Anaphylactic shock, unspecified, initial encounter: Secondary | ICD-10-CM | POA: Insufficient documentation

## 2020-05-07 DIAGNOSIS — T7840XA Allergy, unspecified, initial encounter: Secondary | ICD-10-CM | POA: Insufficient documentation

## 2020-05-07 DIAGNOSIS — D649 Anemia, unspecified: Secondary | ICD-10-CM | POA: Insufficient documentation

## 2020-05-07 DIAGNOSIS — E118 Type 2 diabetes mellitus with unspecified complications: Secondary | ICD-10-CM | POA: Insufficient documentation

## 2020-05-07 DIAGNOSIS — Z111 Encounter for screening for respiratory tuberculosis: Secondary | ICD-10-CM | POA: Insufficient documentation

## 2020-05-07 DIAGNOSIS — M6282 Rhabdomyolysis: Secondary | ICD-10-CM | POA: Insufficient documentation

## 2020-05-14 DIAGNOSIS — E43 Unspecified severe protein-calorie malnutrition: Secondary | ICD-10-CM | POA: Insufficient documentation

## 2020-06-07 DIAGNOSIS — M6281 Muscle weakness (generalized): Secondary | ICD-10-CM | POA: Insufficient documentation

## 2020-07-27 DIAGNOSIS — R52 Pain, unspecified: Secondary | ICD-10-CM | POA: Insufficient documentation

## 2020-07-27 DIAGNOSIS — N186 End stage renal disease: Secondary | ICD-10-CM | POA: Insufficient documentation

## 2020-07-27 DIAGNOSIS — E876 Hypokalemia: Secondary | ICD-10-CM | POA: Insufficient documentation

## 2020-08-04 DIAGNOSIS — T829XXA Unspecified complication of cardiac and vascular prosthetic device, implant and graft, initial encounter: Secondary | ICD-10-CM | POA: Insufficient documentation

## 2020-08-20 ENCOUNTER — Other Ambulatory Visit: Payer: Self-pay

## 2020-08-20 DIAGNOSIS — N179 Acute kidney failure, unspecified: Secondary | ICD-10-CM

## 2020-08-22 DIAGNOSIS — Z955 Presence of coronary angioplasty implant and graft: Secondary | ICD-10-CM | POA: Insufficient documentation

## 2020-09-09 ENCOUNTER — Encounter: Payer: Medicare HMO | Admitting: Vascular Surgery

## 2020-09-09 ENCOUNTER — Ambulatory Visit (HOSPITAL_COMMUNITY): Payer: Medicare HMO

## 2020-10-01 ENCOUNTER — Other Ambulatory Visit: Payer: Self-pay

## 2020-10-01 DIAGNOSIS — N179 Acute kidney failure, unspecified: Secondary | ICD-10-CM

## 2020-10-07 ENCOUNTER — Ambulatory Visit (INDEPENDENT_AMBULATORY_CARE_PROVIDER_SITE_OTHER)
Admission: RE | Admit: 2020-10-07 | Discharge: 2020-10-07 | Disposition: A | Discharge status: Home / Self Care | Payer: Medicare HMO | Source: Ambulatory Visit | Attending: Vascular Surgery | Admitting: Vascular Surgery

## 2020-10-07 ENCOUNTER — Ambulatory Visit (INDEPENDENT_AMBULATORY_CARE_PROVIDER_SITE_OTHER): Payer: Medicare HMO | Admitting: Vascular Surgery

## 2020-10-07 ENCOUNTER — Ambulatory Visit (HOSPITAL_COMMUNITY)
Admission: RE | Admit: 2020-10-07 | Discharge: 2020-10-07 | Disposition: A | Discharge status: Home / Self Care | Payer: Medicare HMO | Source: Ambulatory Visit | Attending: Vascular Surgery | Admitting: Vascular Surgery

## 2020-10-07 ENCOUNTER — Encounter: Payer: Self-pay | Admitting: Vascular Surgery

## 2020-10-07 ENCOUNTER — Other Ambulatory Visit: Payer: Self-pay

## 2020-10-07 VITALS — BP 131/84 | HR 86 | Temp 98.0°F | Resp 20 | Ht 59.0 in | Wt 154.0 lb

## 2020-10-07 DIAGNOSIS — Z992 Dependence on renal dialysis: Secondary | ICD-10-CM | POA: Diagnosis not present

## 2020-10-07 DIAGNOSIS — N186 End stage renal disease: Secondary | ICD-10-CM | POA: Diagnosis not present

## 2020-10-07 DIAGNOSIS — N179 Acute kidney failure, unspecified: Secondary | ICD-10-CM

## 2020-10-07 NOTE — Progress Notes (Signed)
Referring Physician: Dr. Johnney Ou  Patient name: Bethany Sosa MRN: [id number] DOB: [birth year] Sex: female  REASON FOR CONSULT: Hemodialysis access HPI: Bethany Sosa is a 65 y.o. female, with end-stage renal disease since January 2022.  This occurred after having COVID.  She is currently dialyzing via right sided catheter.  She is right-handed.  Her dialysis day is Monday Wednesday Friday.  She is on Plavix.  She has had no other prior access procedures.  She does not have a pacemaker.  She is a former smoker.  Other medical problems include COPD prior stroke seizures asthma.  These are currently controlled.  Past Medical History:  Diagnosis Date   Allergic rhinitis    Asthma    COPD (chronic obstructive pulmonary disease) ([facility])    Coronary artery disease    History of hysterectomy    Hyperthyroidism    Seizures ([place])    Stroke [facility])    Past Surgical History:  Procedure Laterality Date   CORONARY ARTERY BYPASS GRAFT     open heart surgery      Family History  Problem Relation Age of Onset   Asthma Father     SOCIAL HISTORY: Social History   Socioeconomic History   Marital status: Widowed    Spouse name: Not on file   Number of children: Not on file   Years of education: Not on file   Highest education level: Not on file  Occupational History   Not on file  Tobacco Use   Smoking status: Former    Types: Cigarettes    Quit date: 2019    Years since quitting: 3.5   Smokeless tobacco: Never  Vaping Use   Vaping Use: Never used  Substance and Sexual Activity   Alcohol use: No   Drug use: No   Sexual activity: Not Currently  Other Topics Concern   Not on file  Social History Narrative   Right handed    Social Determinants of Health   Financial Resource Strain: Not on file  Food Insecurity: Not on file  Transportation Needs: Not on file  Physical Activity: Not on file  Stress: Not on file  Social Connections: Not on file  Intimate Partner Violence:  Not on file    Allergies  Allergen Reactions   Codeine     GI issues    Current Outpatient Medications  Medication Sig Dispense Refill   aspirin 81 MG EC tablet Take 81 mg by mouth daily. Swallow whole.     citalopram (CELEXA) 20 MG tablet Take 20 mg by mouth daily.     clopidogrel (PLAVIX) 75 MG tablet Take 75 mg by mouth daily.     fluticasone (FLOVENT HFA) 110 MCG/ACT inhaler INHALE 2 PUFFS BY MOUTH TWICE DAILY TO  PREVENT  COUGH  OR  WHEEZE.  RINSE  MOUTH  AFTER  USE. (Patient taking differently: INHALE 2 PUFFS BY MOUTH TWICE DAILY TO  PREVENT  COUGH  OR  WHEEZE.  RINSE  MOUTH  AFTER  USE.) 12 g 3   gabapentin (NEURONTIN) 300 MG capsule      gemfibrozil (LOPID) 600 MG tablet Take 600 mg by mouth 2 (two) times daily before a meal.     hydrOXYzine (ATARAX/VISTARIL) 25 MG tablet TAKE 1 TO 2 TABLETS BY MOUTH EVERY 6 HOURS AS NEEDED FOR ANXIETY     levETIRAcetam (KEPPRA) 1000 MG tablet Take 1 tablet (1,000 mg total) by mouth 2 (two) times daily. 180 tablet 1  levothyroxine (SYNTHROID) 100 MCG tablet Take 100 mcg by mouth daily.     meloxicam (MOBIC) 15 MG tablet Take 15 mg by mouth daily.  3   metoprolol tartrate (LOPRESSOR) 25 MG tablet      nitroGLYCERIN (NITROSTAT) 0.4 MG SL tablet Place 0.4 mg under the tongue every 5 (five) minutes as needed for chest pain.     omeprazole (PRILOSEC) 40 MG capsule TAKE 1 CAPSULE BY MOUTH IN THE MORNING AS DIRECTED 90 capsule 0   rosuvastatin (CRESTOR) 40 MG tablet Take 40 mg by mouth daily.     SYMBICORT 160-4.5 MCG/ACT inhaler INHALE 2 PUFFS WITH SPACER TWICE DAILY TO PREVENT COUGH OR WHEEZING. RINSE, GARGLE AND SPIT AFTER USE 11 g 0   tiotropium (SPIRIVA HANDIHALER) 18 MCG inhalation capsule INHALE 1 PUFF BY MOUTH ONCE DAILY AS DIRECTED. 30 capsule 3   albuterol (PROVENTIL) (2.5 MG/3ML) 0.083% nebulizer solution Take 2.5 mg by nebulization every 4 (four) hours as needed for wheezing or shortness of breath. (Patient not taking: Reported on 10/07/2020)      albuterol (VENTOLIN HFA) 108 (90 Base) MCG/ACT inhaler Can inhale two puffs every four to six hours as needed for cough or wheeze. (Patient not taking: Reported on 10/07/2020) 18 g 1   QVAR REDIHALER 80 MCG/ACT inhaler Inhale two doses twice daily during asthma flare-up as directed.  Rinse, gargle and spit after use. (Patient not taking: Reported on 10/07/2020)     No current facility-administered medications for this visit.    ROS:   General:  No weight loss, Fever, chills  HEENT: No recent headaches, no nasal bleeding, no visual changes, no sore throat  Neurologic: No dizziness, blackouts, seizures. No recent symptoms of stroke or mini- stroke. No recent episodes of slurred speech, or temporary blindness.  Cardiac: No recent episodes of chest pain/pressure, no shortness of breath at rest.  No shortness of breath with exertion.  Denies history of atrial fibrillation or irregular heartbeat  Vascular: No history of rest pain in feet.  No history of claudication.  No history of non-healing ulcer, No history of DVT   Pulmonary: No home oxygen, no productive cough, no hemoptysis,  No asthma or wheezing  Musculoskeletal:  '[X]'$  Arthritis, '[ ]'$  Low back pain,  '[ ]'$  Joint pain  Hematologic:No history of hypercoagulable state.  No history of easy bleeding.  No history of anemia  Gastrointestinal: No hematochezia or melena,  No gastroesophageal reflux, no trouble swallowing  Urinary: '[X]'$  chronic Kidney disease, '[X]'$  on HD - '[X]'$  MWF or '[ ]'$  TTHS, '[ ]'$  Burning with urination, '[ ]'$  Frequent urination, '[ ]'$  Difficulty urinating;   Skin: No rashes  Psychological: No history of anxiety,  No history of depression   Physical Examination  Vitals:   10/07/20 1329  BP: 131/84  Pulse: 86  Resp: 20  Temp: 98 F (36.7 C)  SpO2: 94%  Weight: 154 lb (69.9 kg)  Height: '4\' 11"'$  (1.499 m)    Body mass index is 31.1 kg/m.  General:  Alert and oriented, no acute distress HEENT: Normal Neck: No JVD,  right side dialysis catheter Cardiac: Regular Rate and Rhythm Skin: No rash Extremity Pulses:  2+ radial, brachial pulses bilaterally Musculoskeletal: No deformity or edema  Neurologic: Upper and lower extremity motor 5/5 and symmetric  DATA:  Patient had upper extremity vein mapping today which shows a 3 mm cephalic and basilic vein bilaterally.  She also had upper extremity arterial duplex exam which showed no obstructive flow  in the upper extremities bilaterally.  I reviewed and interpreted both the studies.  ASSESSMENT: End-stage renal disease needs long-term hemodialysis access   PLAN: Left brachiocephalic AV fistula scheduled for Tuesday, October 19, 2020.  Risk benefits possible complications and procedure details including not limited to bleeding infection non maturation of fistula ischemic steal were discussed with patient today.  She understands and agrees to proceed.   Ruta Hinds, MD [facility] [place] Office: [phone]

## 2020-10-07 NOTE — H&P (View-Only) (Signed)
Referring Physician: Dr. Johnney Ou  Patient name: Bethany Sosa MRN: [id number] DOB: 02/25/1956 Sex: female  REASON FOR CONSULT: Hemodialysis access HPI: Bethany Sosa is a 65 y.o. female, with end-stage renal disease since January 2022.  This occurred after having COVID.  She is currently dialyzing via right sided catheter.  She is right-handed.  Her dialysis day is Monday Wednesday Friday.  She is on Plavix.  She has had no other prior access procedures.  She does not have a pacemaker.  She is a former smoker.  Other medical problems include COPD prior stroke seizures asthma.  These are currently controlled.  Past Medical History:  Diagnosis Date   Allergic rhinitis    Asthma    COPD (chronic obstructive pulmonary disease) ([facility])    Coronary artery disease    History of hysterectomy    Hyperthyroidism    Seizures ([place])    Stroke [facility])    Past Surgical History:  Procedure Laterality Date   CORONARY ARTERY BYPASS GRAFT     open heart surgery      Family History  Problem Relation Age of Onset   Asthma Father     SOCIAL HISTORY: Social History   Socioeconomic History   Marital status: Widowed    Spouse name: Not on file   Number of children: Not on file   Years of education: Not on file   Highest education level: Not on file  Occupational History   Not on file  Tobacco Use   Smoking status: Former    Types: Cigarettes    Quit date: 2019    Years since quitting: 3.5   Smokeless tobacco: Never  Vaping Use   Vaping Use: Never used  Substance and Sexual Activity   Alcohol use: No   Drug use: No   Sexual activity: Not Currently  Other Topics Concern   Not on file  Social History Narrative   Right handed    Social Determinants of Health   Financial Resource Strain: Not on file  Food Insecurity: Not on file  Transportation Needs: Not on file  Physical Activity: Not on file  Stress: Not on file  Social Connections: Not on file  Intimate Partner Violence:  Not on file    Allergies  Allergen Reactions   Codeine     GI issues    Current Outpatient Medications  Medication Sig Dispense Refill   aspirin 81 MG EC tablet Take 81 mg by mouth daily. Swallow whole.     citalopram (CELEXA) 20 MG tablet Take 20 mg by mouth daily.     clopidogrel (PLAVIX) 75 MG tablet Take 75 mg by mouth daily.     fluticasone (FLOVENT HFA) 110 MCG/ACT inhaler INHALE 2 PUFFS BY MOUTH TWICE DAILY TO  PREVENT  COUGH  OR  WHEEZE.  RINSE  MOUTH  AFTER  USE. (Patient taking differently: INHALE 2 PUFFS BY MOUTH TWICE DAILY TO  PREVENT  COUGH  OR  WHEEZE.  RINSE  MOUTH  AFTER  USE.) 12 g 3   gabapentin (NEURONTIN) 300 MG capsule      gemfibrozil (LOPID) 600 MG tablet Take 600 mg by mouth 2 (two) times daily before a meal.     hydrOXYzine (ATARAX/VISTARIL) 25 MG tablet TAKE 1 TO 2 TABLETS BY MOUTH EVERY 6 HOURS AS NEEDED FOR ANXIETY     levETIRAcetam (KEPPRA) 1000 MG tablet Take 1 tablet (1,000 mg total) by mouth 2 (two) times daily. 180 tablet 1  levothyroxine (SYNTHROID) 100 MCG tablet Take 100 mcg by mouth daily.     meloxicam (MOBIC) 15 MG tablet Take 15 mg by mouth daily.  3   metoprolol tartrate (LOPRESSOR) 25 MG tablet      nitroGLYCERIN (NITROSTAT) 0.4 MG SL tablet Place 0.4 mg under the tongue every 5 (five) minutes as needed for chest pain.     omeprazole (PRILOSEC) 40 MG capsule TAKE 1 CAPSULE BY MOUTH IN THE MORNING AS DIRECTED 90 capsule 0   rosuvastatin (CRESTOR) 40 MG tablet Take 40 mg by mouth daily.     SYMBICORT 160-4.5 MCG/ACT inhaler INHALE 2 PUFFS WITH SPACER TWICE DAILY TO PREVENT COUGH OR WHEEZING. RINSE, GARGLE AND SPIT AFTER USE 11 g 0   tiotropium (SPIRIVA HANDIHALER) 18 MCG inhalation capsule INHALE 1 PUFF BY MOUTH ONCE DAILY AS DIRECTED. 30 capsule 3   albuterol (PROVENTIL) (2.5 MG/3ML) 0.083% nebulizer solution Take 2.5 mg by nebulization every 4 (four) hours as needed for wheezing or shortness of breath. (Patient not taking: Reported on 10/07/2020)      albuterol (VENTOLIN HFA) 108 (90 Base) MCG/ACT inhaler Can inhale two puffs every four to six hours as needed for cough or wheeze. (Patient not taking: Reported on 10/07/2020) 18 g 1   QVAR REDIHALER 80 MCG/ACT inhaler Inhale two doses twice daily during asthma flare-up as directed.  Rinse, gargle and spit after use. (Patient not taking: Reported on 10/07/2020)     No current facility-administered medications for this visit.    ROS:   General:  No weight loss, Fever, chills  HEENT: No recent headaches, no nasal bleeding, no visual changes, no sore throat  Neurologic: No dizziness, blackouts, seizures. No recent symptoms of stroke or mini- stroke. No recent episodes of slurred speech, or temporary blindness.  Cardiac: No recent episodes of chest pain/pressure, no shortness of breath at rest.  No shortness of breath with exertion.  Denies history of atrial fibrillation or irregular heartbeat  Vascular: No history of rest pain in feet.  No history of claudication.  No history of non-healing ulcer, No history of DVT   Pulmonary: No home oxygen, no productive cough, no hemoptysis,  No asthma or wheezing  Musculoskeletal:  '[X]'$  Arthritis, '[ ]'$  Low back pain,  '[ ]'$  Joint pain  Hematologic:No history of hypercoagulable state.  No history of easy bleeding.  No history of anemia  Gastrointestinal: No hematochezia or melena,  No gastroesophageal reflux, no trouble swallowing  Urinary: '[X]'$  chronic Kidney disease, '[X]'$  on HD - '[X]'$  MWF or '[ ]'$  TTHS, '[ ]'$  Burning with urination, '[ ]'$  Frequent urination, '[ ]'$  Difficulty urinating;   Skin: No rashes  Psychological: No history of anxiety,  No history of depression   Physical Examination  Vitals:   10/07/20 1329  BP: 131/84  Pulse: 86  Resp: 20  Temp: 98 F (36.7 C)  SpO2: 94%  Weight: 154 lb (69.9 kg)  Height: '4\' 11"'$  (1.499 m)    Body mass index is 31.1 kg/m.  General:  Alert and oriented, no acute distress HEENT: Normal Neck: No JVD,  right side dialysis catheter Cardiac: Regular Rate and Rhythm Skin: No rash Extremity Pulses:  2+ radial, brachial pulses bilaterally Musculoskeletal: No deformity or edema  Neurologic: Upper and lower extremity motor 5/5 and symmetric  DATA:  Patient had upper extremity vein mapping today which shows a 3 mm cephalic and basilic vein bilaterally.  She also had upper extremity arterial duplex exam which showed no obstructive flow  in the upper extremities bilaterally.  I reviewed and interpreted both the studies.  ASSESSMENT: End-stage renal disease needs long-term hemodialysis access   PLAN: Left brachiocephalic AV fistula scheduled for Tuesday, October 19, 2020.  Risk benefits possible complications and procedure details including not limited to bleeding infection non maturation of fistula ischemic steal were discussed with patient today.  She understands and agrees to proceed.   Ruta Hinds, MD [facility] [place] Office: [phone]

## 2020-10-11 ENCOUNTER — Other Ambulatory Visit: Payer: Self-pay

## 2020-10-18 ENCOUNTER — Encounter (HOSPITAL_COMMUNITY): Payer: Self-pay | Admitting: Vascular Surgery

## 2020-10-18 ENCOUNTER — Other Ambulatory Visit: Payer: Self-pay

## 2020-10-18 NOTE — Progress Notes (Signed)
PCP - [place], PA-C Cardiologist - did not have one, has a new MD for new pt eval coming up per pt EKG - 06/10/20 c.e - documents req Chest x-ray - 04/29/20 c.c - " ECHO -  Cardiac Cath -  CPAP -   Blood Thinner Instructions: Follow your surgeon's instructions on when to stop plavix.  If no instructions were given by your surgeon then you will need to call the office to get those instructions.    Aspirin Instructions: continue per VVS protocol   ERAS Protcol - n/a  COVID TEST-   Anesthesia review: yes - cardiac hx, per pt she has a murmur and "had a heart attack 2 months ago" and [facility] - documents requested  -------------  SDW INSTRUCTIONS:  Your procedure is scheduled on 8/9 Tuesday. Please report to [facility] Main Entrance "A" at 0530 A.M., and check in at the Admitting office. Call this number if you have problems the morning of surgery: [phone]   Remember: Do not eat or drink after midnight the night before your surgery   Medications to take morning of surgery with a sip of water include: Inhaler --- Please bring all inhalers with you the day of surgery.  Aspirin '81mg'$  atorvastatin (LIPITOR) levETIRAcetam (KEPPRA)  levothyroxine (SYNTHROID) omeprazole (PRILOSEC)  ondansetron (ZOFRAN) if needed   As of today, STOP taking any Aleve, Naproxen, Ibuprofen, Motrin, Advil, Goody's, BC's, all herbal medications, fish oil, and all vitamins.    The Morning of Surgery Do not wear jewelry, make-up or nail polish. Do not wear lotions, powders, or perfumes, or deodorant Do not shave 48 hours prior to surgery.   Do not bring valuables to the hospital. [facility] is not responsible for any belongings or valuables.  If you are a smoker, DO NOT Smoke 24 hours prior to surgery  If you wear a CPAP at night please bring your mask the morning of surgery   Remember that you must have someone to transport you home after your surgery, and remain with you for  24 hours if you are discharged the same day.  Please bring cases for contacts, glasses, hearing aids, dentures or bridgework because it cannot be worn into surgery.   Patients discharged the day of surgery will not be allowed to drive home.   Please shower the NIGHT BEFORE/MORNING OF SURGERY (use antibacterial soap like DIAL soap if possible). Wear comfortable clothes the morning of surgery. Oral Hygiene is also important to reduce your risk of infection.  Remember - BRUSH YOUR TEETH THE MORNING OF SURGERY WITH YOUR REGULAR TOOTHPASTE  Patient denies shortness of breath, fever, cough and chest pain.

## 2020-10-18 NOTE — Progress Notes (Signed)
Anesthesia Chart Review: Bethany Sosa  Case: [id number] Date/Time: 10/19/20 0715   Procedure: LEFT BRACHIOCEPHALIC ARTERIOVENOUS (AV) FISTULA CREATION (Left)   Anesthesia type: Choice   Pre-op diagnosis: ESRD   Location: [facility] ROOM 91 / [facility] OR   Surgeons: Elam Dutch, MD       DISCUSSION: Patient is a 65 year old female scheduled for the above procedure.  History includes former smoker (quit 03/13/17), CAD (s/p CABG  06/13/05: LIMA-LAD, SVG-OM, SVG-RCA-PDA; NSTEMI, s/p DES SVG-OM1 04/30/20, LVEF 45-50% 04/05/20), COPD, asthma, CVA (04/23/16 CT: acute/subacute left frontal lobe infarct; occlusion left ACA distal A1 segment and bilateral occlusions BICA with reconstitution of the clinoid segments 04/24/16 CTA), carotid artery occlusions (right CEA 06/13/05: occluded BICA 04/24/16 CTA), seizures (on Keppra), hypothyroidism (on levothyroxine), PAD (right 4th toe amputation 09/25/12 for non-healing after thromboembolic event), ESRD (s/p right IJ TDC placed 04/07/20, HD started 04/09/20; HD MWF), thoracic compression fractures (T7-8, T 12 03/13/19 CTA), COVID-19 (02/2019, 04/03/20 incidental finding for rhado admission).  - [facility] admission 04/02/20-04/24/20 with worsening myalgias, muscle weakness,  and fatigue x 4 days. CK level >52,000 on admission along with transaminitis. Troponin also peaked at 64 likely demand in setting of active renal injury. Patient also noted to have Cr 4.37 from baseline of ~1.2-2. Nephrology consulted.  CK improved to 20,000 by 04/05/20 with IVF and sodium bicarbonate, but her creatinine continued to worsen from admission to 7.  Not much improvement after attempts at diuresis.  [place] placed 04/07/2020 and an hemodialysis initiated 04/09/2020.  At this time, nephrology attempted to diurese again with combination of Lasix and Metolazone. This combination did not result in much improvement. The patient therefore required vas-cath placement on 04/07/20 and initiation of dialysis on  04/09/20.  Transaminitis felt related to severe rhabdomyolysis.  Hepatitis panel unremarkable except reactive hepatitis B core antibodies.  She had an incidental positive COVID-19 test on admission but was otherwise asymptomatic.  She did require some oxygen in the setting of volume overload associated with AKI/ARF. Echo showed LVEF 45-50% with basal LV inferior wall hypokinesis with out-patient cardiology follow-up planned. She was also transfused for iron deficiency anemia.   - [facility] admission 04/29/20-05/08/20 for NSTEMI, s/p DES to SVG-OM1 04/30/20. Recommendation to continue DAPT for at least 12 months.  Nephrology consulted to manage hemodialysis with plan for AV fistula in 6 weeks if no recovery of her renal function.  - Last PCP note 06/10/20. He notes reported syncope, but also in the setting of poor PO intake, and also nephrology had to discontinue metoprolol due to hypotension.  Primary care records indicate metoprolol discontinued in late March due to hypotension with dialysis. Consider future ZioPatch, but appears he was deferring to when she has cardiology follow-up. His note states, "EKG today without arhythmia. A bit tachy."    Patient need permanent HD access. S/p DES 04/30/20. Per discharge summary, out-patient HD access planned if renal function did not recover after six weeks. She reportedly has upcoming cardiology follow-up. Last EKG requested from [facility], but if not received then would anticipate getting and EKG on arrival. Last /gd 8.8.22. Dr. Oneida Alar is continuing patient on DAPT (ASA and Plavix) perioperatively.  She is a same-day work-up, so anesthesia team to evaluate on the day of surgery.   VS: Ht 5' (1.524 m)   Wt 68 kg   BMI 29.29 kg/m  10/07/20: BP 131/84, HR 86.   PROVIDERS: Nodal, Alphonzo Dublin, PA-C is PCP  Allena Katz, MD is  allergist Ellouise Newer, MD is neurologist - Reports she has upcoming cardiology evaluation. LHC by Clarene Critchley, MD with  04/2020 D/C summary indicating follow-up with Mathis Bud, MD in [facility] (she lives in [place]).    LABS: For ISTAT day of surgery. H/H 05/07/20 was 8.9/27.5.    IMAGES: PCXR 04/1720 ([facility]): FINDINGS:  Post sternotomy changes. Right-sided central venous catheter tip  over the cavoatrial region. Linear scarring or atelectasis at the  lingula and left base. Stable cardiomediastinal silhouette with  aortic atherosclerosis. No pleural effusion or pneumothorax.  IMPRESSION:  Linear scarring or atelectasis at the lingula and left base.     EKG: Requested.    CV: LHC 04/30/20 ([facility]): FINDINGS: Coronary Findings Diagnostic Dominance: Right  Left Anterior Descending: Ost LAD to Prox LAD lesion is 100% stenosed.  Left Circumflex: Ost Cx to Prox Cx lesion is 100% stenosed.  Right Coronary Artery: Dist RCA filled by collaterals from Dist RCA. Ost RCA to Prox RCA lesion is 100% stenosed. Dist RCA lesion is 100% stenosed with 100% stenosed side branch in RPDA. Right Posterior Descending Artery: RPDA filled by collaterals from Dist LAD.  Vein Graft To 1st Mrg: The graft was visualized by angiography. Dist Graft lesion is 80% stenosed. Culprit lesion. Lesion length: 16 mm. TIMI flow is 3. The lesion is not chronically occluded. The lesion was not previously treated. The stenosis was measured by a visual reading.  Free LIMA Graft To Mid LAD  Graft To RPDA: Origin lesion is 100% stenosed.   ANGIOGRAM/CORONARY ARTERIOGRAM:    There was chronic total occlusion in the proximal left anterior  descending, circumflex and right coronary artery.  Patent LIMA graft to the LAD  Occluded saphenous venous graft to the right coronary artery  Patent graft to the marginal branch with significant distal stenosis   LEFT VENTRICULOGRAM:  Left ventriculography was not done   PCI:    A 3.25 mm, X 18 mm Xience Skypoint DES was placed in the Distal saphenous  venous graft to the 1st obtuse  marginal    RECOMMENDATION:   Post PCI orders   Medical therapy for Myocardial infarction and Angina   Dual antiplatelet therapy with Clopidogrel and Aspirin for at least  12  months is warranted    Echo 04/05/20 ([facility]): SUMMARY  There is basal LV inferior wall hypokinesis  LV ejection fraction = 45-50%.  There is no pericardial effusion.  No significant valvular stenosis or regurgitation was detected.  There is no comparison study available.     Carotid US 03/13/19 ([facility]): IMPRESSION: Chronic bilateral ICA occlusion, as before Patent antegrade vertebral flow bilaterally   Past Medical History:  Diagnosis Date   Allergic rhinitis    Asthma    Carotid artery occlusion    bilateral ICA occlusions by neck CTA, diagnosed 04/2016   COPD (chronic obstructive pulmonary disease) (HCC)    Coronary artery disease    ESRD (end stage renal disease) ([place])    hemodialysis initiated 04/09/20   History of hysterectomy    Hypothyroidism    Myocardial infarction [facility])    NSTEMI, s/p DES SVG-OM1 04/30/20   Seizures ([place])    Stroke ([place]) 04/23/2016   acute/subacute left frontal CVA    Past Surgical History:  Procedure Laterality Date   CORONARY ARTERY BYPASS GRAFT     open heart surgery      MEDICATIONS: No current facility-administered medications for this encounter.    albuterol (PROVENTIL) (2.5 MG/3ML) 0.083% nebulizer  solution   albuterol (VENTOLIN HFA) 108 (90 Base) MCG/ACT inhaler   aspirin 81 MG EC tablet   atorvastatin (LIPITOR) 80 MG tablet   clopidogrel (PLAVIX) 75 MG tablet   hydrOXYzine (ATARAX/VISTARIL) 25 MG tablet   levETIRAcetam (KEPPRA) 1000 MG tablet   levothyroxine (SYNTHROID) 200 MCG tablet   nitroGLYCERIN (NITROSTAT) 0.4 MG SL tablet   omeprazole (PRILOSEC) 40 MG capsule   ondansetron (ZOFRAN) 4 MG tablet   SYMBICORT 160-4.5 MCG/ACT inhaler   tiotropium (SPIRIVA HANDIHALER) 18 MCG inhalation capsule   vitamin B-12 (CYANOCOBALAMIN) 500 MCG  tablet   fluticasone (FLOVENT HFA) 110 MCG/ACT inhaler     Myra Gianotti, PA-C Surgical Short Stay/Anesthesiology [facility] Phone [phone] [facility] Phone [phone] 10/18/2020 1:51 PM

## 2020-10-18 NOTE — Anesthesia Preprocedure Evaluation (Addendum)
Anesthesia Evaluation  Patient identified by MRN, date of birth, ID band Patient awake    Reviewed: Allergy & Precautions, NPO status , Patient's Chart, lab work & pertinent test results, reviewed documented beta blocker date and time   Airway Mallampati: II  TM Distance: >3 FB Neck ROM: Full    Dental  (+) Dental Advisory Given   Pulmonary asthma , COPD,  COPD inhaler, former smoker,  Covid 19 08/2020   Pulmonary exam normal breath sounds clear to auscultation       Cardiovascular Exercise Tolerance: Poor hypertension, Pt. on medications + CAD, + Past MI, + Cardiac Stents, + CABG, + Peripheral Vascular Disease and +CHF   Rhythm:Regular Rate:Normal  LHC 04/30/20 ([facility]): ANGIOGRAM/CORONARY ARTERIOGRAM:   There was chronic total occlusion in the proximal left anterior descending, circumflex and right coronary artery.  Patent LIMA graft to the LAD Occluded saphenous venous graft to the right coronary artery Patent graft to themarginal branch with significant distal stenosis   LEFT VENTRICULOGRAM:  Left ventriculography was not done   PCI:   A 3.25 mm, X 18 mm Xience Skypoint DES was placed in the Distal saphenous venous graft to the 1st obtuse marginal   RECOMMENDATION:   Post PCI orders   Medical therapy for Myocardial infarction and Angina   Dual antiplatelet therapy with Clopidogrel and Aspirin for at least 12  months is warranted    Echo 04/05/20 ([facility]): SUMMARY  There is basal LV inferior wall hypokinesis  LV ejection fraction = 45-50%.  There is no pericardial effusion.  No significant valvular stenosis or regurgitation was detected.  There is no comparison study available.   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   Neuro/Psych Seizures -, Well Controlled,  Mild cognitive impairment Poor memory Neuromuscular disease CVA, Residual Symptoms     GI/Hepatic Neg liver ROS, GERD  Medicated,  Endo/Other  diabetes, Well Controlled, Type 2Hypothyroidism Secondary hyperparathyroidism Hyperlipidemia  Renal/GU ESRF and DialysisRenal disease  negative genitourinary   Musculoskeletal  (+) Arthritis , Osteoarthritis,  Hx/o recent hip Fx   Abdominal   Peds  Hematology  (+) anemia , Dual Antiplatelet therapy due to recent stent OM1 Plavix and ASA therapy   Anesthesia Other Findings   Reproductive/Obstetrics                           Anesthesia Physical Anesthesia Plan  ASA: 4  Anesthesia Plan: MAC   Post-op Pain Management:    Induction: Intravenous  PONV Risk Score and Plan: 3 and Treatment may vary due to age or medical condition, Propofol infusion and Ondansetron  Airway Management Planned: Natural Airway and Simple Face Mask  Additional Equipment:   Intra-op Plan:   Post-operative Plan:   Informed Consent: I have reviewed the patients History and Physical, chart, labs and discussed the procedure including the risks, benefits and alternatives for the proposed anesthesia with the patient or authorized representative who has indicated his/her understanding and acceptance.     Dental advisory given  Plan Discussed with: CRNA and Anesthesiologist  Anesthesia Plan Comments: (PAT note written 10/18/2020 by Myra Gianotti, PA-C. DES SVG-OM1 04/30/20. EF 45-50%. Patient to stay on DAPT (ASA & Plavix). HD MWF. Known chronically occluded BICA.  )       Anesthesia Quick Evaluation

## 2020-10-19 ENCOUNTER — Other Ambulatory Visit: Payer: Self-pay

## 2020-10-19 ENCOUNTER — Ambulatory Visit (HOSPITAL_COMMUNITY): Payer: Medicare HMO | Admitting: Physician Assistant

## 2020-10-19 ENCOUNTER — Encounter (HOSPITAL_COMMUNITY)
Admission: RE | Disposition: A | Discharge status: Home / Self Care | Payer: Self-pay | Source: Home / Self Care | Attending: Vascular Surgery

## 2020-10-19 ENCOUNTER — Encounter (HOSPITAL_COMMUNITY): Payer: Self-pay | Admitting: Vascular Surgery

## 2020-10-19 ENCOUNTER — Telehealth: Payer: Self-pay | Admitting: *Deleted

## 2020-10-19 ENCOUNTER — Ambulatory Visit (HOSPITAL_COMMUNITY)
Admission: RE | Admit: 2020-10-19 | Discharge: 2020-10-19 | Disposition: A | Discharge status: Home / Self Care | Payer: Medicare HMO | Attending: Vascular Surgery | Admitting: Vascular Surgery

## 2020-10-19 DIAGNOSIS — Z79890 Hormone replacement therapy (postmenopausal): Secondary | ICD-10-CM | POA: Insufficient documentation

## 2020-10-19 DIAGNOSIS — Z885 Allergy status to narcotic agent status: Secondary | ICD-10-CM | POA: Diagnosis not present

## 2020-10-19 DIAGNOSIS — I509 Heart failure, unspecified: Secondary | ICD-10-CM | POA: Insufficient documentation

## 2020-10-19 DIAGNOSIS — J449 Chronic obstructive pulmonary disease, unspecified: Secondary | ICD-10-CM | POA: Insufficient documentation

## 2020-10-19 DIAGNOSIS — Z955 Presence of coronary angioplasty implant and graft: Secondary | ICD-10-CM | POA: Diagnosis not present

## 2020-10-19 DIAGNOSIS — Z992 Dependence on renal dialysis: Secondary | ICD-10-CM | POA: Insufficient documentation

## 2020-10-19 DIAGNOSIS — E039 Hypothyroidism, unspecified: Secondary | ICD-10-CM | POA: Diagnosis not present

## 2020-10-19 DIAGNOSIS — Z7902 Long term (current) use of antithrombotics/antiplatelets: Secondary | ICD-10-CM | POA: Insufficient documentation

## 2020-10-19 DIAGNOSIS — Z8616 Personal history of COVID-19: Secondary | ICD-10-CM | POA: Insufficient documentation

## 2020-10-19 DIAGNOSIS — N185 Chronic kidney disease, stage 5: Secondary | ICD-10-CM | POA: Diagnosis not present

## 2020-10-19 DIAGNOSIS — Z87891 Personal history of nicotine dependence: Secondary | ICD-10-CM | POA: Insufficient documentation

## 2020-10-19 DIAGNOSIS — E1122 Type 2 diabetes mellitus with diabetic chronic kidney disease: Secondary | ICD-10-CM | POA: Insufficient documentation

## 2020-10-19 DIAGNOSIS — N186 End stage renal disease: Secondary | ICD-10-CM | POA: Insufficient documentation

## 2020-10-19 DIAGNOSIS — Z79899 Other long term (current) drug therapy: Secondary | ICD-10-CM | POA: Insufficient documentation

## 2020-10-19 DIAGNOSIS — N189 Chronic kidney disease, unspecified: Secondary | ICD-10-CM

## 2020-10-19 DIAGNOSIS — Z8673 Personal history of transient ischemic attack (TIA), and cerebral infarction without residual deficits: Secondary | ICD-10-CM | POA: Insufficient documentation

## 2020-10-19 DIAGNOSIS — I132 Hypertensive heart and chronic kidney disease with heart failure and with stage 5 chronic kidney disease, or end stage renal disease: Secondary | ICD-10-CM | POA: Diagnosis not present

## 2020-10-19 DIAGNOSIS — I6523 Occlusion and stenosis of bilateral carotid arteries: Secondary | ICD-10-CM | POA: Insufficient documentation

## 2020-10-19 DIAGNOSIS — I2510 Atherosclerotic heart disease of native coronary artery without angina pectoris: Secondary | ICD-10-CM | POA: Diagnosis not present

## 2020-10-19 DIAGNOSIS — Z951 Presence of aortocoronary bypass graft: Secondary | ICD-10-CM | POA: Diagnosis not present

## 2020-10-19 DIAGNOSIS — Z7982 Long term (current) use of aspirin: Secondary | ICD-10-CM | POA: Insufficient documentation

## 2020-10-19 DIAGNOSIS — Z791 Long term (current) use of non-steroidal anti-inflammatories (NSAID): Secondary | ICD-10-CM | POA: Diagnosis not present

## 2020-10-19 DIAGNOSIS — I252 Old myocardial infarction: Secondary | ICD-10-CM | POA: Insufficient documentation

## 2020-10-19 DIAGNOSIS — Z7951 Long term (current) use of inhaled steroids: Secondary | ICD-10-CM | POA: Diagnosis not present

## 2020-10-19 HISTORY — PX: AV FISTULA PLACEMENT: SHX1204

## 2020-10-19 HISTORY — DX: End stage renal disease: N18.6

## 2020-10-19 HISTORY — DX: Hypothyroidism, unspecified: E03.9

## 2020-10-19 HISTORY — DX: Occlusion and stenosis of unspecified carotid artery: I65.29

## 2020-10-19 HISTORY — DX: Acute myocardial infarction, unspecified: I21.9

## 2020-10-19 LAB — POCT I-STAT, CHEM 8
BUN: 7 mg/dL — ABNORMAL LOW (ref 8–23)
Calcium, Ion: 1.1 mmol/L — ABNORMAL LOW (ref 1.15–1.40)
Chloride: 100 mmol/L (ref 98–111)
Creatinine, Ser: 2.4 mg/dL — ABNORMAL HIGH (ref 0.44–1.00)
Glucose, Bld: 87 mg/dL (ref 70–99)
HCT: 33 % — ABNORMAL LOW (ref 36.0–46.0)
Hemoglobin: 11.2 g/dL — ABNORMAL LOW (ref 12.0–15.0)
Potassium: 3.7 mmol/L (ref 3.5–5.1)
Sodium: 136 mmol/L (ref 135–145)
TCO2: 29 mmol/L (ref 22–32)

## 2020-10-19 SURGERY — ARTERIOVENOUS (AV) FISTULA CREATION
Anesthesia: Monitor Anesthesia Care | Site: Arm Upper | Laterality: Left

## 2020-10-19 MED ORDER — ONDANSETRON HCL 4 MG/2ML IJ SOLN
INTRAMUSCULAR | Status: AC
  Filled 2020-10-19: qty 2

## 2020-10-19 MED ORDER — CHLORHEXIDINE GLUCONATE 4 % EX LIQD: 60.0000 mL | Freq: Once | CUTANEOUS | Status: DC

## 2020-10-19 MED ORDER — LIDOCAINE HCL (PF) 1 % IJ SOLN
INTRAMUSCULAR | Status: DC | PRN
  Administered 2020-10-19: 19 mL

## 2020-10-19 MED ORDER — 0.9 % SODIUM CHLORIDE (POUR BTL) OPTIME
TOPICAL | Status: DC | PRN
  Administered 2020-10-19: 1000 mL

## 2020-10-19 MED ORDER — PROTAMINE SULFATE 10 MG/ML IV SOLN
INTRAVENOUS | Status: AC
  Filled 2020-10-19: qty 5

## 2020-10-19 MED ORDER — FENTANYL CITRATE (PF) 100 MCG/2ML IJ SOLN: 25.0000 ug | INTRAMUSCULAR | Status: DC | PRN

## 2020-10-19 MED ORDER — PHENYLEPHRINE 40 MCG/ML (10ML) SYRINGE FOR IV PUSH (FOR BLOOD PRESSURE SUPPORT)
PREFILLED_SYRINGE | INTRAVENOUS | Status: DC | PRN
  Administered 2020-10-19: 120 ug via INTRAVENOUS
  Administered 2020-10-19 (×5): 80 ug via INTRAVENOUS

## 2020-10-19 MED ORDER — FENTANYL CITRATE (PF) 250 MCG/5ML IJ SOLN
INTRAMUSCULAR | Status: AC
  Filled 2020-10-19: qty 5

## 2020-10-19 MED ORDER — ONDANSETRON HCL 4 MG/2ML IJ SOLN
INTRAMUSCULAR | Status: DC | PRN
  Administered 2020-10-19: 4 mg via INTRAVENOUS

## 2020-10-19 MED ORDER — SODIUM CHLORIDE 0.9 % IV SOLN: INTRAVENOUS | Status: DC

## 2020-10-19 MED ORDER — HEPARIN SODIUM (PORCINE) 1000 UNIT/ML IJ SOLN
INTRAMUSCULAR | Status: DC | PRN
  Administered 2020-10-19: 5000 [IU] via INTRAVENOUS

## 2020-10-19 MED ORDER — FENTANYL CITRATE (PF) 250 MCG/5ML IJ SOLN
INTRAMUSCULAR | Status: DC | PRN
  Administered 2020-10-19 (×2): 25 ug via INTRAVENOUS

## 2020-10-19 MED ORDER — MIDAZOLAM HCL 2 MG/2ML IJ SOLN
INTRAMUSCULAR | Status: AC
  Filled 2020-10-19: qty 2

## 2020-10-19 MED ORDER — PROPOFOL 10 MG/ML IV BOLUS
INTRAVENOUS | Status: DC | PRN
  Administered 2020-10-19: 10 mg via INTRAVENOUS

## 2020-10-19 MED ORDER — HEPARIN SODIUM (PORCINE) 1000 UNIT/ML IJ SOLN
INTRAMUSCULAR | Status: AC
  Filled 2020-10-19: qty 1

## 2020-10-19 MED ORDER — PROPOFOL 500 MG/50ML IV EMUL
INTRAVENOUS | Status: DC | PRN
  Administered 2020-10-19: 75 ug/kg/min via INTRAVENOUS

## 2020-10-19 MED ORDER — SODIUM CHLORIDE 0.9 % IV SOLN: INTRAVENOUS | Status: DC | PRN

## 2020-10-19 MED ORDER — ORAL CARE MOUTH RINSE: 15.0000 mL | Freq: Once | OROMUCOSAL | Status: AC

## 2020-10-19 MED ORDER — PROTAMINE SULFATE 10 MG/ML IV SOLN
INTRAVENOUS | Status: DC | PRN
  Administered 2020-10-19: 20 mg via INTRAVENOUS
  Administered 2020-10-19: 10 mg via INTRAVENOUS
  Administered 2020-10-19: 20 mg via INTRAVENOUS

## 2020-10-19 MED ORDER — PHENYLEPHRINE 40 MCG/ML (10ML) SYRINGE FOR IV PUSH (FOR BLOOD PRESSURE SUPPORT)
PREFILLED_SYRINGE | INTRAVENOUS | Status: AC
  Filled 2020-10-19: qty 10

## 2020-10-19 MED ORDER — HEPARIN SODIUM (PORCINE) 1000 UNIT/ML IJ SOLN
1.6000 mL | Freq: Once | INTRAMUSCULAR | Status: AC
  Administered 2020-10-19: 1000 [IU] via INTRAVENOUS

## 2020-10-19 MED ORDER — HEPARIN 6000 UNIT IRRIGATION SOLUTION
Status: DC | PRN
  Administered 2020-10-19: 1

## 2020-10-19 MED ORDER — CHLORHEXIDINE GLUCONATE 0.12 % MT SOLN
15.0000 mL | Freq: Once | OROMUCOSAL | Status: AC
  Administered 2020-10-19: 15 mL via OROMUCOSAL
  Filled 2020-10-19: qty 15

## 2020-10-19 MED ORDER — MIDAZOLAM HCL 5 MG/5ML IJ SOLN
INTRAMUSCULAR | Status: DC | PRN
  Administered 2020-10-19: 2 mg via INTRAVENOUS

## 2020-10-19 MED ORDER — OXYCODONE-ACETAMINOPHEN 5-325 MG PO TABS: 1.0000 | ORAL_TABLET | Freq: Four times a day (QID) | ORAL | 0 refills | Status: DC | PRN

## 2020-10-19 MED ORDER — LIDOCAINE 2% (20 MG/ML) 5 ML SYRINGE
INTRAMUSCULAR | Status: AC
  Filled 2020-10-19: qty 5

## 2020-10-19 MED ORDER — CEFAZOLIN SODIUM-DEXTROSE 2-4 GM/100ML-% IV SOLN
2.0000 g | INTRAVENOUS | Status: AC
  Administered 2020-10-19: 2 g via INTRAVENOUS
  Filled 2020-10-19: qty 100

## 2020-10-19 MED ORDER — PROPOFOL 10 MG/ML IV BOLUS
INTRAVENOUS | Status: AC
  Filled 2020-10-19: qty 20

## 2020-10-19 SURGICAL SUPPLY — 31 items
ADH SKN CLS APL DERMABOND .7 (GAUZE/BANDAGES/DRESSINGS) ×1
ARMBAND PINK RESTRICT EXTREMIT (MISCELLANEOUS) ×3 IMPLANT
CANISTER SUCT 3000ML PPV (MISCELLANEOUS) ×2 IMPLANT
CANNULA VESSEL 3MM 2 BLNT TIP (CANNULA) ×2 IMPLANT
CLIP VESOCCLUDE MED 6/CT (CLIP) ×2 IMPLANT
CLIP VESOCCLUDE SM WIDE 6/CT (CLIP) ×2 IMPLANT
COVER PROBE W GEL 5X96 (DRAPES) IMPLANT
DECANTER SPIKE VIAL GLASS SM (MISCELLANEOUS) ×2 IMPLANT
DERMABOND ADVANCED (GAUZE/BANDAGES/DRESSINGS) ×1
DERMABOND ADVANCED .7 DNX12 (GAUZE/BANDAGES/DRESSINGS) ×1 IMPLANT
DRAIN PENROSE 1/4X12 LTX STRL (WOUND CARE) ×2 IMPLANT
ELECT REM PT RETURN 9FT ADLT (ELECTROSURGICAL) ×2
ELECTRODE REM PT RTRN 9FT ADLT (ELECTROSURGICAL) ×1 IMPLANT
GLOVE SURG ENC MOIS LTX SZ7.5 (GLOVE) ×2 IMPLANT
GOWN STRL REUS W/ TWL LRG LVL3 (GOWN DISPOSABLE) ×3 IMPLANT
GOWN STRL REUS W/TWL LRG LVL3 (GOWN DISPOSABLE) ×6
KIT BASIN OR (CUSTOM PROCEDURE TRAY) ×2 IMPLANT
KIT TURNOVER KIT B (KITS) ×2 IMPLANT
LOOP VESSEL MINI RED (MISCELLANEOUS) IMPLANT
NS IRRIG 1000ML POUR BTL (IV SOLUTION) ×2 IMPLANT
PACK CV ACCESS (CUSTOM PROCEDURE TRAY) ×2 IMPLANT
PAD ARMBOARD 7.5X6 YLW CONV (MISCELLANEOUS) ×4 IMPLANT
SPONGE SURGIFOAM ABS GEL 100 (HEMOSTASIS) IMPLANT
SUT PROLENE 6 0 CC (SUTURE) ×2 IMPLANT
SUT PROLENE 7 0 BV 1 (SUTURE) IMPLANT
SUT VIC AB 3-0 SH 27 (SUTURE) ×2
SUT VIC AB 3-0 SH 27X BRD (SUTURE) ×1 IMPLANT
SUT VICRYL 4-0 PS2 18IN ABS (SUTURE) ×2 IMPLANT
TOWEL GREEN STERILE (TOWEL DISPOSABLE) ×2 IMPLANT
UNDERPAD 30X36 HEAVY ABSORB (UNDERPADS AND DIAPERS) ×2 IMPLANT
WATER STERILE IRR 1000ML POUR (IV SOLUTION) ×2 IMPLANT

## 2020-10-19 NOTE — Interval H&P Note (Signed)
History and Physical Interval Note:  10/19/2020 7:25 AM  Bethany Sosa  has presented today for surgery, with the diagnosis of ESRD.  The various methods of treatment have been discussed with the patient and family. After consideration of risks, benefits and other options for treatment, the patient has consented to  Procedure(s): LEFT BRACHIOCEPHALIC ARTERIOVENOUS (AV) FISTULA CREATION (Left) as a surgical intervention.  The patient's history has been reviewed, patient examined, no change in status, stable for surgery.  I have reviewed the patient's chart and labs.  Questions were answered to the patient's satisfaction.     Ruta Hinds

## 2020-10-19 NOTE — Anesthesia Postprocedure Evaluation (Signed)
Anesthesia Post Note  Patient: Bethany Sosa  Procedure(s) Performed: LEFT BRACHIOCEPHALIC ARTERIOVENOUS (AV) FISTULA CREATION (Left: Arm Upper)     Patient location during evaluation: PACU Anesthesia Type: MAC Level of consciousness: awake and alert and oriented Pain management: pain level controlled Vital Signs Assessment: post-procedure vital signs reviewed and stable Respiratory status: spontaneous breathing, nonlabored ventilation and respiratory function stable Cardiovascular status: stable and blood pressure returned to baseline Postop Assessment: no apparent nausea or vomiting Anesthetic complications: no   No notable events documented.  Last Vitals:  Vitals:   10/19/20 0908 10/19/20 0918  BP:  131/79  Pulse: 97 92  Resp: 14 14  Temp:    SpO2: 99% 99%    Last Pain:  Vitals:   10/19/20 0918  TempSrc:   PainSc: 0-No pain                 Zebastian Carico A.

## 2020-10-19 NOTE — Telephone Encounter (Signed)
Pts daughter called to let us know that she was prescribed perocset and the pharmacist let her know that she might be allergic to it since she is allergic to codiene. Her reaction to codiene is GI upset. I let her know that many of the alternative medications would likely cause the same problems and to have the pt take tylenol and let us know if her pain becomes unmanageable. Pts daughter verbalized understanding.

## 2020-10-19 NOTE — Anesthesia Procedure Notes (Signed)
Procedure Name: MAC Date/Time: 10/19/2020 7:35 AM Performed by: Harden Mo, CRNA Pre-anesthesia Checklist: Patient identified, Emergency Drugs available, Suction available and Patient being monitored Patient Re-evaluated:Patient Re-evaluated prior to induction Oxygen Delivery Method: Simple face mask Preoxygenation: Pre-oxygenation with 100% oxygen Induction Type: IV induction Placement Confirmation: positive ETCO2 and breath sounds checked- equal and bilateral Dental Injury: Teeth and Oropharynx as per pre-operative assessment

## 2020-10-19 NOTE — Progress Notes (Signed)
Consulted to flush right HD catheter. Venous HD cath flushed with 1.87mof heparin 1000u/ml. End cap placed with do not use sign in place. RN aware.

## 2020-10-19 NOTE — Transfer of Care (Signed)
Immediate Anesthesia Transfer of Care Note  Patient: Bethany Sosa  Procedure(s) Performed: LEFT BRACHIOCEPHALIC ARTERIOVENOUS (AV) FISTULA CREATION (Left: Arm Upper)  Patient Location: PACU  Anesthesia Type:MAC  Level of Consciousness: awake, alert  and oriented  Airway & Oxygen Therapy: Patient Spontanous Breathing  Post-op Assessment: Report given to RN, Post -op Vital signs reviewed and stable and Patient moving all extremities X 4  Post vital signs: Reviewed and stable  Last Vitals:  Vitals Value Taken Time  BP 161/79 10/19/20 0902  Temp    Pulse 94 10/19/20 0903  Resp 13 10/19/20 0903  SpO2 100 % 10/19/20 0903  Vitals shown include unvalidated device data.  Last Pain:  Vitals:   10/19/20 0615  TempSrc:   PainSc: 0-No pain         Complications: No notable events documented.

## 2020-10-19 NOTE — Op Note (Addendum)
Procedure: Left brachiocephalic AV fistula  Preoperative diagnosis: ESRD Postoperative diagnosis: Same  Anesthesia: Local with IV sedation  Assistant: Leontine Locket, PA-C  Operative findings: 3 mm left cephalic vein  Operative details: After pain informed consent, the patient was taken the operating.  The patient was placed supine position operating table.  After adequate sedation patient's left upper extremity was prepped and draped in usual sterile fashion.  Ultrasound was used to identify the course of the left cephalic vein.  Local anesthesia was infiltrated near the antecubital crease.  Transverse incision was made in this location carried down through the subcutaneous tissues down to the level of the left cephalic vein.  It was about 3 mm in diameter.  It was of good quality.  It was dissected free circumferentially.  Of note the patient's tissues were quite edematous almost suggesting anasarca.  Next the brachial artery was dissected free and the medial portion incision.  It was about 3 mm in diameter.  Vesseloops were placed proximal distal to the planned site of arteriotomy.  The patient was given 5000 notes of intravenous heparin.  The distal cephalic vein was ligated with 3-0 silk ties and transected.  The vein was gently distended with heparinized saline marked for orientation and swung over to the level of the artery.  The artery was controlled proximally distally with Vesseloops.  A longitude opening was made in the brachial artery and the vein sewn end of vein to side of artery using a running 6-0 Prolene suture.  Just prior to completion of the anastomosis it was for blood backbled and thoroughly flushed.  Anastomosis was secured clamps released there is palpable thrill in the fistula immediately.  Hemostasis was obtained.  The patient was given 50 mg of protamine.  Subcutaneous tissues were reapproximated using a running 3-0 Vicryl suture.  The skin was closed with a 4-0 Vicryl  subcuticular stitch.  The patient tolerated procedure well and there were no complications.  The instrument sponge and needle count was correct the end of the case.  Ruta Hinds, MD [facility] [place] Office: [phone]

## 2020-10-19 NOTE — Discharge Instructions (Signed)
   [facility]  Discharge Instructions  AV Fistula or Graft Surgery for Dialysis Access  Please refer to the following instructions for your post-procedure care. Your surgeon or physician assistant will discuss any changes with you.  Activity  You may drive the day following your surgery, if you are comfortable and no longer taking prescription pain medication. Resume full activity as the soreness in your incision resolves.  Bathing/Showering  You may shower after you go home. Keep your incision dry for 48 hours. Do not soak in a bathtub, hot tub, or swim until the incision heals completely. You may not shower if you have a hemodialysis catheter.  Incision Care  Clean your incision with mild soap and water after 48 hours. Pat the area dry with a clean towel. You do not need a bandage unless otherwise instructed. Do not apply any ointments or creams to your incision. You may have skin glue on your incision. Do not peel it off. It will come off on its own in about one week. Your arm may swell a bit after surgery. To reduce swelling use pillows to elevate your arm so it is above your heart. Your doctor will tell you if you need to lightly wrap your arm with an ACE bandage.  Diet  Resume your normal diet. There are not special food restrictions following this procedure. In order to heal from your surgery, it is CRITICAL to get adequate nutrition. Your body requires vitamins, minerals, and protein. Vegetables are the best source of vitamins and minerals. Vegetables also provide the perfect balance of protein. Processed food has little nutritional value, so try to avoid this.  Medications  Resume taking all of your medications. If your incision is causing pain, you may take over-the counter pain relievers such as acetaminophen (Tylenol). If you were prescribed a stronger pain medication, please be aware these medications can cause nausea and constipation. Prevent  nausea by taking the medication with a snack or meal. Avoid constipation by drinking plenty of fluids and eating foods with high amount of fiber, such as fruits, vegetables, and grains.  Do not take Tylenol if you are taking prescription pain medications.  Follow up Your surgeon may want to see you in the office following your access surgery. If so, this will be arranged at the time of your surgery.  Please call us immediately for any of the following conditions:  Increased pain, redness, drainage (pus) from your incision site Fever of 101 degrees or higher Severe or worsening pain at your incision site Hand pain or numbness.  Reduce your risk of vascular disease:  Stop smoking. If you would like help, call [facility] at 1-800-QUIT-NOW 314-843-2617) or [place] at [place] your cholesterol Maintain a desired weight Control your diabetes Keep your blood pressure down  Dialysis  It will take several weeks to several months for your new dialysis access to be ready for use. Your surgeon will determine when it is okay to use it. Your nephrologist will continue to direct your dialysis. You can continue to use your Permcath until your new access is ready for use.   10/19/2020 MERLIN DILULLO LY:[id number] [birth year]  Surgeon(s): Fields, Jessy Oto, MD  Procedure(s): LEFT BRACHIOCEPHALIC ARTERIOVENOUS (AV) FISTULA CREATION  x Do not stick fistula for 12 weeks    If you have any questions, please call the office at [phone].

## 2020-10-20 ENCOUNTER — Encounter (HOSPITAL_COMMUNITY): Payer: Self-pay | Admitting: Vascular Surgery

## 2020-11-09 ENCOUNTER — Other Ambulatory Visit: Payer: Self-pay

## 2020-11-09 ENCOUNTER — Encounter: Payer: Self-pay | Admitting: Neurology

## 2020-11-09 ENCOUNTER — Ambulatory Visit (INDEPENDENT_AMBULATORY_CARE_PROVIDER_SITE_OTHER): Payer: Medicare HMO | Admitting: Neurology

## 2020-11-09 VITALS — BP 176/72 | HR 88 | Ht 60.0 in | Wt 152.0 lb

## 2020-11-09 DIAGNOSIS — G40209 Localization-related (focal) (partial) symptomatic epilepsy and epileptic syndromes with complex partial seizures, not intractable, without status epilepticus: Secondary | ICD-10-CM | POA: Diagnosis not present

## 2020-11-09 MED ORDER — LEVETIRACETAM 1000 MG PO TABS: ORAL_TABLET | ORAL | 3 refills | Status: DC

## 2020-11-09 NOTE — Progress Notes (Signed)
NEUROLOGY FOLLOW UP OFFICE NOTE  Bethany Sosa LY:[id number] 1955-04-08  HISTORY OF PRESENT ILLNESS: I had the pleasure of seeing Bethany Sosa in follow-up in the neurology clinic on 11/09/2020. She is again accompanied by her daughter Bethany Sosa who helps supplement the history. The patient was last evaluated in 08/2018 for focal post-stroke seizures. Since her last visit, she has had several new medication issues. She reports having COVID early in the year causing renal failure, she started dialysis in February. She was in the hospital a month later for NSTEMI. She has recovered from the NSTEMI. She does dialysis three times a week. She continues on Levetiracetam '1000mg'$  BID (not on renal dosing), they both deny any seizures since 2020. She denies any right-sided shaking, no staring/unresponsiveness, focal numbness/tingling/weakness,myoclonic jerks. No headaches, dizziness, no falls. She has been driving. She lives with her 2 grandchildren Bethany Sosa takes care of her medication needs. She was having syncopal episodes a year ago, none in a year.    History on Initial Assessment 08/14/2016: This is a pleasant 65 yo RH woman with a history of hyperlipidemia, CAD s/p CABG, bilateral carotid artery stenosis (?occlusion), left frontal stroke in February 2018, presenting for seizures. Her granddaughter reports the first episode occurred in January 2018, she witnessed upper body shaking with difficulty speaking lasting a few minutes, then she was back to baseline. On 04/22/16, she felt that "something was wrong with me." She denied any focal weakness. Per PCP notes, she was "saying weird off the wall and repeating herself." She was admitted to [facility] for 3 days where she saw Teleneurology and was diagnosed with a stroke. She was reported to have complete blockage of both carotid arteries. Her family reports that after the stroke, she started having episodes of her right leg giving out on her. One time in March  while at the store, she was using her walker and told her daughter she needed to sit down. She apparently passed out and slumped to her right side, unconscious for a couple of seconds. No shaking noted by daughter. Her granddaughter reports an episode at the kitchen table where she had shaking of the right leg and arm for a split second. She felt this coming on, like she could not work her leg. Her granddaughter reported she would not talk until the shaking stopped, then denied any post-event weakness or confusion. It appears she would have these right-sided episodes every couple of weeks. No associated tongue bite or incontinence. The last one occurred 06/23/16, she was not feeling good that day and was helped to the bedside commode. She used the commode then her head went back and she passed out for a few seconds. When she came to, EMS was around her. She did not recall having any focal weakness. She had an MRI without contrast on 06/24/16 which I personally reviewed, there was some continued restricted diffusion in the left ACA territory similar to prior MRI in February, extending to the level of the anterior genu of the corpus callosum. No new infarcts. The temporal lobes were symmetric. She was discharged home on Keppra '500mg'$  BID. Since then, her family denies any further right-sided symptoms or loss of consciousness. She denies any side effects on the Keppra. She denies any olfactory/gustatory hallucinations, deja vu, rising epigastric sensation, gaps in time, staring/unresponsive episodes. She had memory changes after the stroke and reports there are still some bits and pieces that she cannot remember. Prior to the stroke, she was driving without difficulties,  no missed bills payments (her daughter has been in charge of finances since the stroke). Her daughter has been helping with medications since the stroke, prior to this she was forgetting every now and then.    She denies any headaches, dizziness,  diplopia, dysarthria/dysphagia, neck/back pain, bowel/bladder dysfunction. It appears she was complaining of cough in September 2017 and had a chest xray which showed mild compression deformities in the mid-thoracic spine of indeterminate age. She was reporting back pain to her PCP and was referred to Ortho. She currently denies any back pain. She lives with her children. Her father had Alzheimer's disease. She denies any history of alcohol use.  She had a normal birth and early development.  There is no history of febrile convulsions, CNS infections such as meningitis/encephalitis, significant traumatic brain injury, neurosurgical procedures, or family history of seizures.   PAST MEDICAL HISTORY: Past Medical History:  Diagnosis Date   Allergic rhinitis    Asthma    Carotid artery occlusion    bilateral ICA occlusions by neck CTA, diagnosed 04/2016   COPD (chronic obstructive pulmonary disease) (HCC)    Coronary artery disease    ESRD (end stage renal disease) ([place])    hemodialysis initiated 04/09/20   History of hysterectomy    Hypothyroidism    Myocardial infarction [facility])    NSTEMI, s/p DES [id number] 04/30/20   Seizures ([place])    Stroke ([place]) 04/23/2016   acute/subacute left frontal CVA    MEDICATIONS: Current Outpatient Medications on File Prior to Visit  Medication Sig Dispense Refill   albuterol (PROVENTIL) (2.5 MG/3ML) 0.083% nebulizer solution Take 2.5 mg by nebulization every 4 (four) hours as needed for wheezing or shortness of breath.     albuterol (VENTOLIN HFA) 108 (90 Base) MCG/ACT inhaler Can inhale two puffs every four to six hours as needed for cough or wheeze. 18 g 1   aspirin 81 MG EC tablet Take 81 mg by mouth daily. Swallow whole.     atorvastatin (LIPITOR) 80 MG tablet Take 80 mg by mouth daily.     clopidogrel (PLAVIX) 75 MG tablet Take 75 mg by mouth daily.     fluticasone (FLOVENT HFA) 110 MCG/ACT inhaler INHALE 2 PUFFS BY MOUTH TWICE DAILY TO  PREVENT  COUGH  OR   WHEEZE.  RINSE  MOUTH  AFTER  USE. (Patient not taking: No sig reported) 12 g 3   hydrOXYzine (ATARAX/VISTARIL) 25 MG tablet Take 25 mg by mouth every 6 (six) hours as needed for anxiety.     levETIRAcetam (KEPPRA) 1000 MG tablet Take 1 tablet (1,000 mg total) by mouth 2 (two) times daily. 180 tablet 1   levothyroxine (SYNTHROID) 200 MCG tablet Take 200 mcg by mouth daily.     nitroGLYCERIN (NITROSTAT) 0.4 MG SL tablet Place 0.4 mg under the tongue every 5 (five) minutes as needed for chest pain.     omeprazole (PRILOSEC) 40 MG capsule TAKE 1 CAPSULE BY MOUTH IN THE MORNING AS DIRECTED 90 capsule 0   ondansetron (ZOFRAN) 4 MG tablet Take 4 mg by mouth 2 (two) times daily as needed.     oxyCODONE-acetaminophen (PERCOCET) 5-325 MG tablet Take 1 tablet by mouth every 6 (six) hours as needed for severe pain. 8 tablet 0   SYMBICORT 160-4.5 MCG/ACT inhaler INHALE 2 PUFFS WITH SPACER TWICE DAILY TO PREVENT COUGH OR WHEEZING. RINSE, GARGLE AND SPIT AFTER USE (Patient taking differently: Inhale 2 puffs into the lungs daily as needed (asthma).) 11 g 0  tiotropium (SPIRIVA HANDIHALER) 18 MCG inhalation capsule INHALE 1 PUFF BY MOUTH ONCE DAILY AS DIRECTED. (Patient taking differently: Place 18 mcg into inhaler and inhale 2 (two) times daily.) 30 capsule 3   vitamin B-12 (CYANOCOBALAMIN) 500 MCG tablet Take 500 mcg by mouth daily.     No current facility-administered medications on file prior to visit.    ALLERGIES: Allergies  Allergen Reactions   Codeine     GI issues    FAMILY HISTORY: Family History  Problem Relation Age of Onset   Asthma Father     SOCIAL HISTORY: Social History   Socioeconomic History   Marital status: Widowed    Spouse name: Not on file   Number of children: Not on file   Years of education: Not on file   Highest education level: Not on file  Occupational History   Not on file  Tobacco Use   Smoking status: Former    Types: Cigarettes    Quit date: 2019    Years  since quitting: 3.6   Smokeless tobacco: Never  Vaping Use   Vaping Use: Never used  Substance and Sexual Activity   Alcohol use: No   Drug use: No   Sexual activity: Not Currently  Other Topics Concern   Not on file  Social History Narrative   Right handed    Social Determinants of Health   Financial Resource Strain: Not on file  Food Insecurity: Not on file  Transportation Needs: Not on file  Physical Activity: Not on file  Stress: Not on file  Social Connections: Not on file  Intimate Partner Violence: Not on file     PHYSICAL EXAM: Vitals:   11/09/20 1009  BP: (!) 176/72  Pulse: 88  SpO2: 97%   General: No acute distress Head:  Normocephalic/atraumatic Skin/Extremities: No rash, no edema Neurological Exam: alert and awake. No aphasia or dysarthria. Fund of knowledge is appropriate.  Recent and remote memory are intact.  Attention and concentration are normal.   Cranial nerves: Pupils equal, round. Extraocular movements intact with no nystagmus. Visual fields full.  No facial asymmetry.  Motor: Bulk and tone normal, muscle strength 5/5 throughout with no pronator drift.   Finger to nose testing intact.  Gait slow and cautious due to right hip pain.   IMPRESSION: This is a pleasant 65 yo RH woman with a history of hyperlipidemia, CAD s/p CABG, bilateral carotid artery stenosis (?occlusion), left ACA stroke in February 2018, with subsequent focal seizures. She describes a sensation of inability to control her right leg, with right leg and arm shaking. Family also described episodes of loss of consciousness with no clear shaking. EEG showed occasional left frontotemporal slowing, no epileptiform discharges. They deny any seizures in the past year. She was lost to follow-up and since last visit is now on hemodialysis. We discussed renal dosing of Levetiracetam in patients on HD, reduce Levetiracetam '1000mg'$  to 1 tablet every 24 hours. She plans to take it every evening. Discussed  that if seizures recur, would give additional 1/2 tablet after HD. She is aware of [place] driving laws to stop driving after a seizure until 6 months seizure-free. Follow-up in 6 months, they know to call for any changes.    Thank you for allowing me to participate in her care.  Please do not hesitate to call for any questions or concerns.    Ellouise Newer, M.D.   CCAgnes Lawrence, PA-C

## 2020-11-09 NOTE — Patient Instructions (Signed)
Good to see you!   Reduce Keppra to '1000mg'$ : take 1 tablet every night. If seizures recur, please call our office and we will start taking an additional 1/2 tablet after each dialysis session  2. Follow-up in 6 months, call for any changes   Seizure Precautions: 1. If medication has been prescribed for you to prevent seizures, take it exactly as directed.  Do not stop taking the medicine without talking to your doctor first, even if you have not had a seizure in a long time.   2. Avoid activities in which a seizure would cause danger to yourself or to others.  Don't operate dangerous machinery, swim alone, or climb in high or dangerous places, such as on ladders, roofs, or girders.  Do not drive unless your doctor says you may.  3. If you have any warning that you may have a seizure, lay down in a safe place where you can't hurt yourself.    4.  No driving for 6 months from last seizure, as per [facility].   Please refer to the following link on the [place] website for more information: http://[email or web address]/[email or web address]/drivingu.cfm   5.  Maintain good sleep hygiene.   6.  Contact your doctor if you have any problems that may be related to the medicine you are taking.  7.  Call 911 and bring the patient back to the ED if:        A.  The seizure lasts longer than 5 minutes.       B.  The patient doesn't awaken shortly after the seizure  C.  The patient has new problems such as difficulty seeing, speaking or moving  D.  The patient was injured during the seizure  E.  The patient has a temperature over 102 F (39C)  F.  The patient vomited and now is having trouble breathing

## 2020-11-22 ENCOUNTER — Other Ambulatory Visit: Payer: Self-pay

## 2020-11-22 DIAGNOSIS — N186 End stage renal disease: Secondary | ICD-10-CM

## 2020-11-30 ENCOUNTER — Ambulatory Visit (INDEPENDENT_AMBULATORY_CARE_PROVIDER_SITE_OTHER): Payer: Medicare HMO | Admitting: Physician Assistant

## 2020-11-30 ENCOUNTER — Other Ambulatory Visit: Payer: Self-pay

## 2020-11-30 ENCOUNTER — Ambulatory Visit (HOSPITAL_COMMUNITY)
Admission: RE | Admit: 2020-11-30 | Discharge: 2020-11-30 | Disposition: A | Discharge status: Home / Self Care | Payer: Medicare HMO | Source: Ambulatory Visit | Attending: Vascular Surgery | Admitting: Vascular Surgery

## 2020-11-30 VITALS — BP 114/61 | HR 68 | Temp 97.9°F | Resp 20 | Ht 60.0 in

## 2020-11-30 DIAGNOSIS — N186 End stage renal disease: Secondary | ICD-10-CM | POA: Insufficient documentation

## 2020-11-30 DIAGNOSIS — Z992 Dependence on renal dialysis: Secondary | ICD-10-CM

## 2020-11-30 NOTE — Progress Notes (Signed)
POST OPERATIVE OFFICE NOTE    CC:  F/u for surgery  HPI:  This is a 65 y.o. female who is s/p left BC AVF on 10/19/2020 by Dr. Oneida Alar.   Pt states she does not have pain/numbness in the left hand.   She currently dialyzes through a right TDC that was placed in [facility].    The pt is on dialysis M/W/F at [facility] location.   Allergies  Allergen Reactions   Codeine     GI issues    Current Outpatient Medications  Medication Sig Dispense Refill   albuterol (PROVENTIL) (2.5 MG/3ML) 0.083% nebulizer solution Take 2.5 mg by nebulization every 4 (four) hours as needed for wheezing or shortness of breath.     albuterol (VENTOLIN HFA) 108 (90 Base) MCG/ACT inhaler Can inhale two puffs every four to six hours as needed for cough or wheeze. 18 g 1   aspirin 81 MG EC tablet Take 81 mg by mouth daily. Swallow whole.     atorvastatin (LIPITOR) 80 MG tablet Take 80 mg by mouth daily.     clopidogrel (PLAVIX) 75 MG tablet Take 75 mg by mouth daily.     fluticasone (FLOVENT HFA) 110 MCG/ACT inhaler INHALE 2 PUFFS BY MOUTH TWICE DAILY TO  PREVENT  COUGH  OR  WHEEZE.  RINSE  MOUTH  AFTER  USE. (Patient taking differently: INHALE 2 PUFFS BY MOUTH TWICE DAILY TO  PREVENT  COUGH  OR  WHEEZE.  RINSE  MOUTH  AFTER  USE.) 12 g 3   hydrOXYzine (ATARAX/VISTARIL) 25 MG tablet Take 25 mg by mouth every 6 (six) hours as needed for anxiety.     levETIRAcetam (KEPPRA) 1000 MG tablet Take 1 tablet every night 90 tablet 3   levothyroxine (SYNTHROID) 200 MCG tablet Take 200 mcg by mouth daily.     Methoxy PEG-Epoetin Beta (MIRCERA IJ) Mircera     nitroGLYCERIN (NITROSTAT) 0.4 MG SL tablet Place 0.4 mg under the tongue every 5 (five) minutes as needed for chest pain.     omeprazole (PRILOSEC) 40 MG capsule TAKE 1 CAPSULE BY MOUTH IN THE MORNING AS DIRECTED 90 capsule 0   ondansetron (ZOFRAN) 4 MG tablet Take 4 mg by mouth 2 (two) times daily as needed.     SYMBICORT 160-4.5 MCG/ACT inhaler INHALE 2 PUFFS WITH SPACER  TWICE DAILY TO PREVENT COUGH OR WHEEZING. RINSE, GARGLE AND SPIT AFTER USE (Patient taking differently: Inhale 2 puffs into the lungs daily as needed (asthma).) 11 g 0   tiotropium (SPIRIVA HANDIHALER) 18 MCG inhalation capsule INHALE 1 PUFF BY MOUTH ONCE DAILY AS DIRECTED. (Patient taking differently: Place 18 mcg into inhaler and inhale 2 (two) times daily.) 30 capsule 3   vitamin B-12 (CYANOCOBALAMIN) 500 MCG tablet Take 500 mcg by mouth daily.     No current facility-administered medications for this visit.     ROS:  See HPI  Physical Exam:  Today's Vitals   11/30/20 1027  BP: 114/61  Pulse: 68  Resp: 20  Temp: 97.9 F (36.6 C)  TempSrc: Temporal  SpO2: 94%  Height: 5' (1.524 m)   Body mass index is 29.69 kg/m.   Incision:  healed nicely Extremities:   There is a palpable left radial pulse and left hand is warm. Motor and sensory are in tact.   There is a thrill/bruit present.  The fistula is easily palpable distally   Dialysis Duplex on 11/30/2020: Diameter:  0.24-0.64cm Depth:  0.52cm-1.47cm There is a competing  branch in the mid UA and a retained valve at the [facility] fossa where it is stenotic.     Assessment/Plan:  This is a 65 y.o. female who is s/p: Left BC AVF on 10/19/2020 by Dr. Oneida Alar  -the pt does not have evidence of steal. -the fistula is slow to mature with a stenosis in the [facility] fossa and is slow to mature in the upper arm where there is a competing branch.  I discussed with the pt proceeding with fistulogram and determine next step after that.  Discussed with her that the fistula may need to be superficialized and ligation of competing branch to help it mature.  Also discussed despite these efforts, the fistula may still fail to mature.   Pt is tearful but agrees to  proceed.  She is on Plavix.  -will set her up for fistulogram on non dialysis day.     Leontine Locket, [facility] [phone]  Clinic MD:  Stanford Breed.

## 2020-12-02 ENCOUNTER — Telehealth: Payer: Self-pay | Admitting: Neurology

## 2020-12-02 ENCOUNTER — Other Ambulatory Visit: Payer: Self-pay | Admitting: Neurology

## 2020-12-02 MED ORDER — LEVETIRACETAM 500 MG PO TABS: ORAL_TABLET | ORAL | 11 refills | Status: DC

## 2020-12-02 NOTE — Telephone Encounter (Signed)
Pls let her know that I will send in a prescription for Keppra '500mg'$ : take 1 tablet twice a day. She needs to take an extra dose after dialysis. Thanks.

## 2020-12-02 NOTE — Telephone Encounter (Signed)
Pt called, said she needs to be put back on the keppra twice daily. She cant handle just once a day. After dialysis, she is wiped out.

## 2020-12-02 NOTE — Telephone Encounter (Signed)
Pt called per DPR left a voice mail will send in a prescription for Keppra '500mg'$ : take 1 tablet twice a day. She needs to take an extra dose after dialysis

## 2020-12-16 ENCOUNTER — Other Ambulatory Visit: Payer: Self-pay

## 2020-12-16 ENCOUNTER — Encounter (HOSPITAL_COMMUNITY)
Admission: RE | Disposition: A | Discharge status: Home / Self Care | Payer: Self-pay | Source: Ambulatory Visit | Attending: Vascular Surgery

## 2020-12-16 ENCOUNTER — Encounter (HOSPITAL_COMMUNITY): Payer: Self-pay | Admitting: Vascular Surgery

## 2020-12-16 ENCOUNTER — Ambulatory Visit (HOSPITAL_COMMUNITY)
Admission: RE | Admit: 2020-12-16 | Discharge: 2020-12-16 | Disposition: A | Discharge status: Home / Self Care | Payer: Medicare HMO | Source: Ambulatory Visit | Attending: Vascular Surgery | Admitting: Vascular Surgery

## 2020-12-16 DIAGNOSIS — Z7902 Long term (current) use of antithrombotics/antiplatelets: Secondary | ICD-10-CM | POA: Insufficient documentation

## 2020-12-16 DIAGNOSIS — Z992 Dependence on renal dialysis: Secondary | ICD-10-CM | POA: Insufficient documentation

## 2020-12-16 DIAGNOSIS — N186 End stage renal disease: Secondary | ICD-10-CM | POA: Insufficient documentation

## 2020-12-16 DIAGNOSIS — T82898A Other specified complication of vascular prosthetic devices, implants and grafts, initial encounter: Secondary | ICD-10-CM | POA: Diagnosis not present

## 2020-12-16 DIAGNOSIS — T82510A Breakdown (mechanical) of surgically created arteriovenous fistula, initial encounter: Secondary | ICD-10-CM | POA: Diagnosis present

## 2020-12-16 DIAGNOSIS — Z79899 Other long term (current) drug therapy: Secondary | ICD-10-CM | POA: Insufficient documentation

## 2020-12-16 DIAGNOSIS — Y841 Kidney dialysis as the cause of abnormal reaction of the patient, or of later complication, without mention of misadventure at the time of the procedure: Secondary | ICD-10-CM | POA: Insufficient documentation

## 2020-12-16 DIAGNOSIS — Z79890 Hormone replacement therapy (postmenopausal): Secondary | ICD-10-CM | POA: Insufficient documentation

## 2020-12-16 DIAGNOSIS — Z7951 Long term (current) use of inhaled steroids: Secondary | ICD-10-CM | POA: Insufficient documentation

## 2020-12-16 DIAGNOSIS — Z885 Allergy status to narcotic agent status: Secondary | ICD-10-CM | POA: Diagnosis not present

## 2020-12-16 DIAGNOSIS — Z7982 Long term (current) use of aspirin: Secondary | ICD-10-CM | POA: Diagnosis not present

## 2020-12-16 HISTORY — PX: A/V FISTULAGRAM: CATH118298

## 2020-12-16 LAB — POCT I-STAT, CHEM 8
BUN: 15 mg/dL (ref 8–23)
Calcium, Ion: 1.22 mmol/L (ref 1.15–1.40)
Chloride: 102 mmol/L (ref 98–111)
Creatinine, Ser: 2.8 mg/dL — ABNORMAL HIGH (ref 0.44–1.00)
Glucose, Bld: 83 mg/dL (ref 70–99)
HCT: 31 % — ABNORMAL LOW (ref 36.0–46.0)
Hemoglobin: 10.5 g/dL — ABNORMAL LOW (ref 12.0–15.0)
Potassium: 3.8 mmol/L (ref 3.5–5.1)
Sodium: 138 mmol/L (ref 135–145)
TCO2: 26 mmol/L (ref 22–32)

## 2020-12-16 SURGERY — A/V FISTULAGRAM
Anesthesia: LOCAL

## 2020-12-16 MED ORDER — IODIXANOL 320 MG/ML IV SOLN
INTRAVENOUS | Status: DC | PRN
  Administered 2020-12-16: 60 mL via INTRAVENOUS

## 2020-12-16 MED ORDER — HEPARIN (PORCINE) IN NACL 1000-0.9 UT/500ML-% IV SOLN
INTRAVENOUS | Status: DC | PRN
  Administered 2020-12-16: 500 mL

## 2020-12-16 MED ORDER — SODIUM CHLORIDE 0.9% FLUSH: 3.0000 mL | Freq: Two times a day (BID) | INTRAVENOUS | Status: DC

## 2020-12-16 MED ORDER — LIDOCAINE HCL (PF) 1 % IJ SOLN
INTRAMUSCULAR | Status: DC | PRN
  Administered 2020-12-16: 2 mL via INTRADERMAL

## 2020-12-16 MED ORDER — LIDOCAINE HCL (PF) 1 % IJ SOLN
INTRAMUSCULAR | Status: AC
  Filled 2020-12-16: qty 30

## 2020-12-16 MED ORDER — SODIUM CHLORIDE 0.9% FLUSH: 3.0000 mL | INTRAVENOUS | Status: DC | PRN

## 2020-12-16 MED ORDER — SODIUM CHLORIDE 0.9 % IV SOLN: 250.0000 mL | INTRAVENOUS | Status: DC | PRN

## 2020-12-16 SURGICAL SUPPLY — 9 items

## 2020-12-16 NOTE — H&P (Signed)
History and Physical Interval Note:  12/16/2020 8:55 AM  Bethany Sosa  has presented today for surgery, with the diagnosis of instage renal.  The various methods of treatment have been discussed with the patient and family. After consideration of risks, benefits and other options for treatment, the patient has consented to  Procedure(s): A/V FISTULAGRAM (N/A) as a surgical intervention.  The patient's history has been reviewed, patient examined, no change in status, stable for surgery.  I have reviewed the patient's chart and labs.  Questions were answered to the patient's satisfaction.     Marty Heck  POST OPERATIVE OFFICE NOTE       CC:  F/u for surgery   HPI:  This is a 65 y.o. female who is s/p left BC AVF on 10/19/2020 by Dr. Oneida Alar.    Pt states she does not have pain/numbness in the left hand.   She currently dialyzes through a right TDC that was placed in [facility].     The pt is on dialysis M/W/F at [facility] location.          Allergies  Allergen Reactions   Codeine        GI issues            Current Outpatient Medications  Medication Sig Dispense Refill   albuterol (PROVENTIL) (2.5 MG/3ML) 0.083% nebulizer solution Take 2.5 mg by nebulization every 4 (four) hours as needed for wheezing or shortness of breath.       albuterol (VENTOLIN HFA) 108 (90 Base) MCG/ACT inhaler Can inhale two puffs every four to six hours as needed for cough or wheeze. 18 g 1   aspirin 81 MG EC tablet Take 81 mg by mouth daily. Swallow whole.       atorvastatin (LIPITOR) 80 MG tablet Take 80 mg by mouth daily.       clopidogrel (PLAVIX) 75 MG tablet Take 75 mg by mouth daily.       fluticasone (FLOVENT HFA) 110 MCG/ACT inhaler INHALE 2 PUFFS BY MOUTH TWICE DAILY TO  PREVENT  COUGH  OR  WHEEZE.  RINSE  MOUTH  AFTER  USE. (Patient taking differently: INHALE 2 PUFFS BY MOUTH TWICE DAILY TO  PREVENT  COUGH  OR  WHEEZE.  RINSE  MOUTH  AFTER  USE.) 12 g 3   hydrOXYzine (ATARAX/VISTARIL)  25 MG tablet Take 25 mg by mouth every 6 (six) hours as needed for anxiety.       levETIRAcetam (KEPPRA) 1000 MG tablet Take 1 tablet every night 90 tablet 3   levothyroxine (SYNTHROID) 200 MCG tablet Take 200 mcg by mouth daily.       Methoxy PEG-Epoetin Beta (MIRCERA IJ) Mircera       nitroGLYCERIN (NITROSTAT) 0.4 MG SL tablet Place 0.4 mg under the tongue every 5 (five) minutes as needed for chest pain.       omeprazole (PRILOSEC) 40 MG capsule TAKE 1 CAPSULE BY MOUTH IN THE MORNING AS DIRECTED 90 capsule 0   ondansetron (ZOFRAN) 4 MG tablet Take 4 mg by mouth 2 (two) times daily as needed.       SYMBICORT 160-4.5 MCG/ACT inhaler INHALE 2 PUFFS WITH SPACER TWICE DAILY TO PREVENT COUGH OR WHEEZING. RINSE, GARGLE AND SPIT AFTER USE (Patient taking differently: Inhale 2 puffs into the lungs daily as needed (asthma).) 11 g 0   tiotropium (SPIRIVA HANDIHALER) 18 MCG inhalation capsule INHALE 1 PUFF BY MOUTH ONCE DAILY AS DIRECTED. (Patient taking differently: Place 73  mcg into inhaler and inhale 2 (two) times daily.) 30 capsule 3   vitamin B-12 (CYANOCOBALAMIN) 500 MCG tablet Take 500 mcg by mouth daily.        No current facility-administered medications for this visit.       ROS:  See HPI   Physical Exam:      Today's Vitals    11/30/20 1027  BP: 114/61  Pulse: 68  Resp: 20  Temp: 97.9 F (36.6 C)  TempSrc: Temporal  SpO2: 94%  Height: 5' (1.524 m)    Body mass index is 29.69 kg/m.     Incision:  healed nicely Extremities:   There is a palpable left radial pulse and left hand is warm. Motor and sensory are in tact.   There is a thrill/bruit present.  The fistula is easily palpable distally     Dialysis Duplex on 11/30/2020: Diameter:  0.24-0.64cm Depth:  0.52cm-1.47cm There is a competing branch in the mid UA and a retained valve at the [facility] fossa where it is stenotic.       Assessment/Plan:  This is a 65 y.o. female who is s/p: Left BC AVF on 10/19/2020 by Dr. Oneida Alar    -the pt does not have evidence of steal. -the fistula is slow to mature with a stenosis in the [facility] fossa and is slow to mature in the upper arm where there is a competing branch.  I discussed with the pt proceeding with fistulogram and determine next step after that.  Discussed with her that the fistula may need to be superficialized and ligation of competing branch to help it mature.  Also discussed despite these efforts, the fistula may still fail to mature.   Pt is tearful but agrees to  proceed.  She is on Plavix.  -will set her up for fistulogram on non dialysis day.       Leontine Locket, [facility] [phone]   Clinic MD:  Stanford Breed.

## 2020-12-16 NOTE — Op Note (Signed)
    OPERATIVE NOTE   PROCEDURE: left brachiocephalic arteriovenous fistula cannulation under ultrasound guidance left arm fistulogram  PRE-OPERATIVE DIAGNOSIS: Malfunctioning left arteriovenous fistula  POST-OPERATIVE DIAGNOSIS: same as above   SURGEON: Marty Heck, MD  ANESTHESIA: local  ESTIMATED BLOOD LOSS: 5 cc  FINDING(S): Left brachiocephalic fistula was patent with no visualized stenosis including no central stenosis.  There were multiple large side branches in the distal to mid upper arm.  Patient will benefit from sidebranch ligation to further facilitate maturation of the fistula.  Will schedule today.  SPECIMEN(S):  None  CONTRAST: 60 mL  INDICATIONS: Bethany Sosa is a 65 y.o. female who  presents with malfunctioning left brachiocephalic arteriovenous fistula.  The patient is scheduled for left arm fistulogram.  The patient is aware the risks include but are not limited to: bleeding, infection, thrombosis of the cannulated access, and possible anaphylactic reaction to the contrast.  The patient is aware of the risks of the procedure and elects to proceed forward.  DESCRIPTION: After full informed written consent was obtained, the patient was brought back to the angiography suite and placed supine upon the angiography table.  The patient was connected to monitoring equipment.  The left arm was prepped and draped in the standard fashion for a left arm fistulogram.  Under ultrasound guidance, the left brachiocepahlic arteriovenous fistula was evaluated, it was patent, an image was saved.  It was cannulated with a micropuncture needle.  The microwire was advanced into the fistula and the needle was exchanged for the a microsheath, which was lodged 2 cm into the access.  The wire was removed and the sheath was connected to the IV extension tubing.  Hand injections were completed to image the access from the antecubitum up to the level of axilla.  The central venous  structures were also imaged by hand injections.  Based on the images, this patient will need: surgical revision with sidebranch ligation.  A 4-0 Monocryl purse-string suture was sewn around the sheath.  The sheath was removed while tying down the suture.  A sterile bandage was applied to the puncture site.  COMPLICATIONS: None  CONDITION: None  Marty Heck, MD [facility] Office: [place]   12/16/2020 10:02 AM

## 2020-12-17 ENCOUNTER — Other Ambulatory Visit: Payer: Self-pay

## 2020-12-30 ENCOUNTER — Encounter (HOSPITAL_COMMUNITY): Payer: Self-pay | Admitting: Vascular Surgery

## 2020-12-30 ENCOUNTER — Other Ambulatory Visit: Payer: Self-pay

## 2020-12-30 NOTE — Progress Notes (Signed)
Spoke with pt's daughter [name] for pre-op call. DPR on file. Pt has hx of CAD with stents in February 2022. Pt is on Plavix, last dose per daughter was 12/25/20. Pt to continue her Aspirin.  Pt's surgery is scheduled as ambulatory so no Covid test is required prior to surgery.

## 2020-12-31 ENCOUNTER — Encounter (HOSPITAL_COMMUNITY)
Admission: RE | Disposition: A | Discharge status: Home / Self Care | Payer: Self-pay | Source: Ambulatory Visit | Attending: Vascular Surgery

## 2020-12-31 ENCOUNTER — Other Ambulatory Visit: Payer: Self-pay

## 2020-12-31 ENCOUNTER — Ambulatory Visit (HOSPITAL_COMMUNITY)
Admission: RE | Admit: 2020-12-31 | Discharge: 2020-12-31 | Disposition: A | Discharge status: Home / Self Care | Payer: Medicare HMO | Source: Ambulatory Visit | Attending: Vascular Surgery | Admitting: Vascular Surgery

## 2020-12-31 ENCOUNTER — Ambulatory Visit (HOSPITAL_COMMUNITY): Payer: Medicare HMO | Admitting: Anesthesiology

## 2020-12-31 ENCOUNTER — Encounter (HOSPITAL_COMMUNITY): Payer: Self-pay | Admitting: Vascular Surgery

## 2020-12-31 DIAGNOSIS — Z885 Allergy status to narcotic agent status: Secondary | ICD-10-CM | POA: Insufficient documentation

## 2020-12-31 DIAGNOSIS — D631 Anemia in chronic kidney disease: Secondary | ICD-10-CM | POA: Diagnosis not present

## 2020-12-31 DIAGNOSIS — I132 Hypertensive heart and chronic kidney disease with heart failure and with stage 5 chronic kidney disease, or end stage renal disease: Secondary | ICD-10-CM | POA: Diagnosis not present

## 2020-12-31 DIAGNOSIS — N186 End stage renal disease: Secondary | ICD-10-CM | POA: Insufficient documentation

## 2020-12-31 DIAGNOSIS — Z79890 Hormone replacement therapy (postmenopausal): Secondary | ICD-10-CM | POA: Diagnosis not present

## 2020-12-31 DIAGNOSIS — I509 Heart failure, unspecified: Secondary | ICD-10-CM | POA: Insufficient documentation

## 2020-12-31 DIAGNOSIS — I252 Old myocardial infarction: Secondary | ICD-10-CM | POA: Insufficient documentation

## 2020-12-31 DIAGNOSIS — Z7982 Long term (current) use of aspirin: Secondary | ICD-10-CM | POA: Insufficient documentation

## 2020-12-31 DIAGNOSIS — E1122 Type 2 diabetes mellitus with diabetic chronic kidney disease: Secondary | ICD-10-CM | POA: Diagnosis not present

## 2020-12-31 DIAGNOSIS — N2581 Secondary hyperparathyroidism of renal origin: Secondary | ICD-10-CM | POA: Diagnosis not present

## 2020-12-31 DIAGNOSIS — Z7951 Long term (current) use of inhaled steroids: Secondary | ICD-10-CM | POA: Diagnosis not present

## 2020-12-31 DIAGNOSIS — E1151 Type 2 diabetes mellitus with diabetic peripheral angiopathy without gangrene: Secondary | ICD-10-CM | POA: Insufficient documentation

## 2020-12-31 DIAGNOSIS — Z951 Presence of aortocoronary bypass graft: Secondary | ICD-10-CM | POA: Diagnosis not present

## 2020-12-31 DIAGNOSIS — Z87891 Personal history of nicotine dependence: Secondary | ICD-10-CM | POA: Insufficient documentation

## 2020-12-31 DIAGNOSIS — Z992 Dependence on renal dialysis: Secondary | ICD-10-CM | POA: Insufficient documentation

## 2020-12-31 DIAGNOSIS — Z79899 Other long term (current) drug therapy: Secondary | ICD-10-CM | POA: Diagnosis not present

## 2020-12-31 DIAGNOSIS — Z7902 Long term (current) use of antithrombotics/antiplatelets: Secondary | ICD-10-CM | POA: Diagnosis not present

## 2020-12-31 DIAGNOSIS — X58XXXA Exposure to other specified factors, initial encounter: Secondary | ICD-10-CM | POA: Insufficient documentation

## 2020-12-31 DIAGNOSIS — T82898A Other specified complication of vascular prosthetic devices, implants and grafts, initial encounter: Secondary | ICD-10-CM

## 2020-12-31 HISTORY — PX: REVISON OF ARTERIOVENOUS FISTULA: SHX6074

## 2020-12-31 HISTORY — PX: LIGATION OF COMPETING BRANCHES OF ARTERIOVENOUS FISTULA: SHX5949

## 2020-12-31 HISTORY — DX: Gastro-esophageal reflux disease without esophagitis: K21.9

## 2020-12-31 HISTORY — DX: Anxiety disorder, unspecified: F41.9

## 2020-12-31 HISTORY — DX: Unspecified osteoarthritis, unspecified site: M19.90

## 2020-12-31 LAB — POCT I-STAT, CHEM 8
BUN: 19 mg/dL (ref 8–23)
Calcium, Ion: 1.22 mmol/L (ref 1.15–1.40)
Chloride: 101 mmol/L (ref 98–111)
Creatinine, Ser: 3.9 mg/dL — ABNORMAL HIGH (ref 0.44–1.00)
Glucose, Bld: 77 mg/dL (ref 70–99)
HCT: 34 % — ABNORMAL LOW (ref 36.0–46.0)
Hemoglobin: 11.6 g/dL — ABNORMAL LOW (ref 12.0–15.0)
Potassium: 3.8 mmol/L (ref 3.5–5.1)
Sodium: 138 mmol/L (ref 135–145)
TCO2: 26 mmol/L (ref 22–32)

## 2020-12-31 SURGERY — REVISON OF ARTERIOVENOUS FISTULA
Anesthesia: Monitor Anesthesia Care | Laterality: Left

## 2020-12-31 MED ORDER — OXYCODONE HCL 5 MG/5ML PO SOLN
5.0000 mg | Freq: Once | ORAL | Status: DC | PRN
Start: 2020-12-31 — End: 2020-12-31

## 2020-12-31 MED ORDER — HEPARIN 6000 UNIT IRRIGATION SOLUTION
Status: DC | PRN
  Administered 2020-12-31: 1

## 2020-12-31 MED ORDER — LIDOCAINE HCL (PF) 1 % IJ SOLN
INTRAMUSCULAR | Status: AC
  Filled 2020-12-31: qty 30

## 2020-12-31 MED ORDER — SODIUM CHLORIDE 0.9 % IV SOLN: INTRAVENOUS | Status: DC

## 2020-12-31 MED ORDER — FENTANYL CITRATE (PF) 250 MCG/5ML IJ SOLN
INTRAMUSCULAR | Status: AC
  Filled 2020-12-31: qty 5

## 2020-12-31 MED ORDER — CEFAZOLIN SODIUM-DEXTROSE 2-4 GM/100ML-% IV SOLN
2.0000 g | INTRAVENOUS | Status: AC
  Administered 2020-12-31: 2 g via INTRAVENOUS
  Filled 2020-12-31: qty 100

## 2020-12-31 MED ORDER — PROPOFOL 500 MG/50ML IV EMUL
INTRAVENOUS | Status: DC | PRN
  Administered 2020-12-31: 25 ug/kg/min via INTRAVENOUS

## 2020-12-31 MED ORDER — CHLORHEXIDINE GLUCONATE 4 % EX LIQD: 60.0000 mL | Freq: Once | CUTANEOUS | Status: DC

## 2020-12-31 MED ORDER — ONDANSETRON HCL 4 MG/2ML IJ SOLN: 4.0000 mg | Freq: Once | INTRAMUSCULAR | Status: DC | PRN

## 2020-12-31 MED ORDER — OXYCODONE HCL 5 MG PO TABS
5.0000 mg | ORAL_TABLET | Freq: Once | ORAL | Status: DC | PRN
Start: 2020-12-31 — End: 2020-12-31

## 2020-12-31 MED ORDER — TRAMADOL HCL 50 MG PO TABS: 50.0000 mg | ORAL_TABLET | Freq: Four times a day (QID) | ORAL | 0 refills | Status: DC | PRN

## 2020-12-31 MED ORDER — HEPARIN 6000 UNIT IRRIGATION SOLUTION
Status: AC
  Filled 2020-12-31: qty 500

## 2020-12-31 MED ORDER — FENTANYL CITRATE (PF) 100 MCG/2ML IJ SOLN
INTRAMUSCULAR | Status: DC | PRN
  Administered 2020-12-31: 25 ug via INTRAVENOUS
  Administered 2020-12-31: 50 ug via INTRAVENOUS
  Administered 2020-12-31 (×2): 25 ug via INTRAVENOUS

## 2020-12-31 MED ORDER — 0.9 % SODIUM CHLORIDE (POUR BTL) OPTIME
TOPICAL | Status: DC | PRN
  Administered 2020-12-31: 1000 mL

## 2020-12-31 MED ORDER — CHLORHEXIDINE GLUCONATE 0.12 % MT SOLN
15.0000 mL | Freq: Once | OROMUCOSAL | Status: AC
  Administered 2020-12-31: 15 mL via OROMUCOSAL
  Filled 2020-12-31: qty 15

## 2020-12-31 MED ORDER — ROPIVACAINE HCL 5 MG/ML IJ SOLN
INTRAMUSCULAR | Status: DC | PRN
  Administered 2020-12-31: 30 mL via EPIDURAL

## 2020-12-31 MED ORDER — FENTANYL CITRATE (PF) 100 MCG/2ML IJ SOLN: 25.0000 ug | INTRAMUSCULAR | Status: DC | PRN

## 2020-12-31 MED ORDER — HEPARIN SODIUM (PORCINE) 1000 UNIT/ML IJ SOLN
1600.0000 [IU] | Freq: Once | INTRAMUSCULAR | Status: AC
  Administered 2020-12-31: 1600 [IU] via INTRAVENOUS
  Filled 2020-12-31: qty 1.6

## 2020-12-31 MED ORDER — LIDOCAINE HCL (PF) 1 % IJ SOLN
INTRAMUSCULAR | Status: AC
  Filled 2020-12-31: qty 60

## 2020-12-31 MED ORDER — ONDANSETRON HCL 4 MG/2ML IJ SOLN
INTRAMUSCULAR | Status: DC | PRN
  Administered 2020-12-31: 4 mg via INTRAVENOUS

## 2020-12-31 MED ORDER — ORAL CARE MOUTH RINSE: 15.0000 mL | Freq: Once | OROMUCOSAL | Status: AC

## 2020-12-31 SURGICAL SUPPLY — 43 items
ADH SKN CLS APL DERMABOND .7 (GAUZE/BANDAGES/DRESSINGS) ×1
AGENT HMST SPONGE THK3/8 (HEMOSTASIS)
ARMBAND PINK RESTRICT EXTREMIT (MISCELLANEOUS) ×2 IMPLANT
BAG COUNTER SPONGE SURGICOUNT (BAG) ×2 IMPLANT
BAG SPNG CNTER NS LX DISP (BAG) ×1
CANISTER SUCT 3000ML PPV (MISCELLANEOUS) ×2 IMPLANT
CLIP VESOCCLUDE MED 24/CT (CLIP) ×2 IMPLANT
CLIP VESOCCLUDE MED 6/CT (CLIP) ×2 IMPLANT
CLIP VESOCCLUDE SM WIDE 24/CT (CLIP) ×2 IMPLANT
CLIP VESOCCLUDE SM WIDE 6/CT (CLIP) ×2 IMPLANT
COVER PROBE W GEL 5X96 (DRAPES) ×2 IMPLANT
DECANTER SPIKE VIAL GLASS SM (MISCELLANEOUS) ×2 IMPLANT
DERMABOND ADVANCED (GAUZE/BANDAGES/DRESSINGS) ×1
DERMABOND ADVANCED .7 DNX12 (GAUZE/BANDAGES/DRESSINGS) ×1 IMPLANT
ELECT REM PT RETURN 9FT ADLT (ELECTROSURGICAL) ×2
ELECTRODE REM PT RTRN 9FT ADLT (ELECTROSURGICAL) ×1 IMPLANT
GAUZE 4X4 16PLY ~~LOC~~+RFID DBL (SPONGE) ×1 IMPLANT
GLOVE SRG 8 PF TXTR STRL LF DI (GLOVE) ×1 IMPLANT
GLOVE SURG ENC MOIS LTX SZ7.5 (GLOVE) ×2 IMPLANT
GLOVE SURG UNDER POLY LF SZ8 (GLOVE) ×2
GOWN STRL REUS W/ TWL LRG LVL3 (GOWN DISPOSABLE) ×2 IMPLANT
GOWN STRL REUS W/ TWL XL LVL3 (GOWN DISPOSABLE) ×2 IMPLANT
GOWN STRL REUS W/TWL LRG LVL3 (GOWN DISPOSABLE) ×4
GOWN STRL REUS W/TWL XL LVL3 (GOWN DISPOSABLE) ×4
HEMOSTAT SPONGE AVITENE ULTRA (HEMOSTASIS) IMPLANT
KIT BASIN OR (CUSTOM PROCEDURE TRAY) ×2 IMPLANT
KIT TURNOVER KIT B (KITS) ×2 IMPLANT
NS IRRIG 1000ML POUR BTL (IV SOLUTION) ×2 IMPLANT
PACK CV ACCESS (CUSTOM PROCEDURE TRAY) ×2 IMPLANT
PAD ARMBOARD 7.5X6 YLW CONV (MISCELLANEOUS) ×4 IMPLANT
SPONGE T-LAP 18X18 ~~LOC~~+RFID (SPONGE) ×1 IMPLANT
STAPLER VISISTAT 35W (STAPLE) IMPLANT
SUT ETHILON 3 0 PS 1 (SUTURE) IMPLANT
SUT MNCRL AB 4-0 PS2 18 (SUTURE) ×4 IMPLANT
SUT PROLENE 5 0 C 1 24 (SUTURE) ×1 IMPLANT
SUT PROLENE 6 0 BV (SUTURE) ×3 IMPLANT
SUT PROLENE 7 0 BV 1 (SUTURE) IMPLANT
SUT SILK 0 TIES 10X30 (SUTURE) ×2 IMPLANT
SUT VIC AB 3-0 SH 27 (SUTURE) ×4
SUT VIC AB 3-0 SH 27X BRD (SUTURE) ×1 IMPLANT
TOWEL GREEN STERILE (TOWEL DISPOSABLE) ×2 IMPLANT
UNDERPAD 30X36 HEAVY ABSORB (UNDERPADS AND DIAPERS) ×2 IMPLANT
WATER STERILE IRR 1000ML POUR (IV SOLUTION) ×2 IMPLANT

## 2020-12-31 NOTE — Discharge Instructions (Signed)
[facility]  Discharge Instructions  AV Fistula or Graft Surgery for Dialysis Access  Please refer to the following instructions for your post-procedure care. Your surgeon or physician assistant will discuss any changes with you.  Activity  You may drive the day following your surgery, if you are comfortable and no longer taking prescription pain medication. Resume full activity as the soreness in your incision resolves.  Bathing/Showering  You may shower after you go home. Keep your incision dry for 48 hours. Do not soak in a bathtub, hot tub, or swim until the incision heals completely. You may not shower if you have a hemodialysis catheter.  Incision Care  Clean your incision with mild soap and water after 48 hours. Pat the area dry with a clean towel. You do not need a bandage unless otherwise instructed. Do not apply any ointments or creams to your incision. You may have skin glue on your incision. Do not peel it off. It will come off on its own in about one week. Your arm may swell a bit after surgery. To reduce swelling use pillows to elevate your arm so it is above your heart. Your doctor will tell you if you need to lightly wrap your arm with an ACE bandage.  Diet  Resume your normal diet. There are not special food restrictions following this procedure. In order to heal from your surgery, it is CRITICAL to get adequate nutrition. Your body requires vitamins, minerals, and protein. Vegetables are the best source of vitamins and minerals. Vegetables also provide the perfect balance of protein. Processed food has little nutritional value, so try to avoid this.  Medications  Resume taking all of your medications. If your incision is causing pain, you may take over-the counter pain relievers such as acetaminophen (Tylenol). If you were prescribed a stronger pain medication, please be aware these medications can cause nausea and constipation. Prevent  nausea by taking the medication with a snack or meal. Avoid constipation by drinking plenty of fluids and eating foods with high amount of fiber, such as fruits, vegetables, and grains.  Do not take Tylenol if you are taking prescription pain medications.  Follow up Your surgeon may want to see you in the office following your access surgery. If so, this will be arranged at the time of your surgery.  Please call us immediately for any of the following conditions:  Increased pain, redness, drainage (pus) from your incision site Fever of 101 degrees or higher Severe or worsening pain at your incision site Hand pain or numbness.  Reduce your risk of vascular disease:  Stop smoking. If you would like help, call [facility] at 1-800-QUIT-NOW (360) 823-9466) or [place] at [place] your cholesterol Maintain a desired weight Control your diabetes Keep your blood pressure down  Dialysis  It will take several weeks to several months for your new dialysis access to be ready for use. Your surgeon will determine when it is okay to use it. Your nephrologist will continue to direct your dialysis. You can continue to use your Permcath until your new access is ready for use.   12/31/2020 Bethany Sosa [id number] [birth year]  Surgeon(s): Marty Heck, MD  Procedure(s): LEFT ARM ARTERIOVENOUS FISTULA REVISION SIDE BRANCH LIGATION x3   May stick graft immediately   May stick graft on designated area only:   X Do not stick left AV Fistula for 4 weeks    If you have any questions, please  call the office at [phone].

## 2020-12-31 NOTE — Progress Notes (Signed)
Orthopedic Tech Progress Note Patient Details:  Bethany Sosa 01/12/1956 [id number]  Ortho Devices Type of Ortho Device: Arm sling Ortho Device/Splint Interventions: Ordered      Bethany Sosa 12/31/2020, 9:40 AM

## 2020-12-31 NOTE — Anesthesia Postprocedure Evaluation (Signed)
Anesthesia Post Note  Patient: DAYLYNN STUMPP  Procedure(s) Performed: LEFT ARM ARTERIOVENOUS FISTULA REVISION (Left) SIDE BRANCH LIGATION x3 (Left)     Patient location during evaluation: PACU Anesthesia Type: MAC Level of consciousness: awake and alert Pain management: pain level controlled Vital Signs Assessment: post-procedure vital signs reviewed and stable Respiratory status: spontaneous breathing and respiratory function stable Cardiovascular status: stable Postop Assessment: no apparent nausea or vomiting Anesthetic complications: no   No notable events documented.  Last Vitals:  Vitals:   12/31/20 0905 12/31/20 0920  BP: (!) 126/51 (!) 138/55  Pulse: 71 70  Resp: 15 17  Temp: (!) 36.1 C   SpO2: 99% 94%    Last Pain:  Vitals:   12/31/20 0920  TempSrc:   PainSc: 0-No pain                 Denajah Farias DANIEL

## 2020-12-31 NOTE — Anesthesia Procedure Notes (Signed)
Procedure Name: MAC Date/Time: 12/31/2020 7:45 AM Performed by: Babs Bertin, CRNA Pre-anesthesia Checklist: Patient identified, Emergency Drugs available, Suction available, Patient being monitored and Timeout performed Patient Re-evaluated:Patient Re-evaluated prior to induction Oxygen Delivery Method: Simple face mask

## 2020-12-31 NOTE — Op Note (Signed)
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 Marty Heck, MD  Assistant: Paulo Fruit, PA  Indications: Patient is a 65 year old female that has a slow to mature left arm brachiocephalic AV fistula.  She recently underwent a fistulogram that showed multiple large side branches.  She presents today for fistula revision with sidebranch ligation after risk benefits discussed to facilitate maturation of the fistula.    Findings: Ultrasound was used to mark three large side branches on the left upper arm brachiocephalic AV fistula.  Three transverse incisions were made over the branches and then the branches were ligated between 2-0 silk ties and divided.  Excellent thrill at completion.  Anesthesia: Regional block  Details: Patient was taken to the operating room after informed consent was obtained.  Placed on the operative table in supine position.  Anesthesia was induced with regional block.  The left arm was prepped and draped in usual sterile fashion.  Antibiotics were given.  Timeout was performed.  Initially used a sterile SonoSite ultrasound to evaluate the left upper arm fistula.  We identified three large side branches that were then marked over the course of the fistula.  I then used a 15 blade scalpel and made three small transverse incisions over these branches.  We dissected down with Bovie cautery and used small wheatlander retractors.  Ultimately able get right angle clamps around these branches and these were then ligated with multiple 2-0 silk ties and divided.  Excellent thrill at completion.  We then irrigated out the incisions and closed the skin with 4-0 Monocryl and Dermabond.  Taken to PACU in stable condition.  Complication: None  Condition: Stable  Marty Heck, MD [facility]  [place] Office: [place]

## 2020-12-31 NOTE — Anesthesia Procedure Notes (Signed)
Anesthesia Regional Block: Supraclavicular block   Pre-Anesthetic Checklist: , timeout performed,  Correct Patient, Correct Site, Correct Laterality,  Correct Procedure, Correct Position, site marked,  Risks and benefits discussed,  Surgical consent,  Pre-op evaluation  Laterality: Left  Prep: chloraprep       Needles:  Injection technique: Single-shot  Needle Type: Echogenic Stimulator Needle     Needle Length: 10cm  Needle Gauge: 21   Needle insertion depth: 7 cm   Additional Needles:   Procedures:,,,, ultrasound used (permanent image in chart),,    Narrative:  Start time: 12/31/2020 7:15 AM End time: 12/31/2020 7:20 AM Injection made incrementally with aspirations every 5 mL.  Performed by: Personally  Anesthesiologist: Josephine Igo, MD  Additional Notes: Timeout performed. Patient sedated. Relevant anatomy ID'd using [place]. Incremental 2-92ml injection of LA with frequent aspiration. Patient tolerated procedure well.    Left Supraclavicular Block

## 2020-12-31 NOTE — Anesthesia Preprocedure Evaluation (Addendum)
Anesthesia Evaluation  Patient identified by MRN, date of birth, ID band Patient awake    Reviewed: Allergy & Precautions, NPO status , Patient's Chart, lab work & pertinent test results, reviewed documented beta blocker date and time   Airway Mallampati: II  TM Distance: >3 FB Neck ROM: Full    Dental  (+) Edentulous Upper, Edentulous Lower   Pulmonary asthma , COPD,  COPD inhaler, former smoker,  Covid 19 08/2020   Pulmonary exam normal breath sounds clear to auscultation       Cardiovascular Exercise Tolerance: Poor hypertension, Pt. on medications + CAD, + Past MI, + Cardiac Stents, + CABG, + Peripheral Vascular Disease and +CHF   Rhythm:Regular Rate:Normal  LHC 04/30/20 ([facility]): ANGIOGRAM/CORONARY ARTERIOGRAM:   There was chronic total occlusion in the proximal left anterior descending, circumflex and right coronary artery.  Patent LIMA graft to the LAD Occluded saphenous venous graft to the right coronary artery Patent graft to themarginal branch with significant distal stenosis   LEFT VENTRICULOGRAM:  Left ventriculography was not done   PCI:   A 3.25 mm, X 18 mm Xience Skypoint DES was placed in the Distal saphenous venous graft to the 1st obtuse marginal   RECOMMENDATION:   Post PCI orders   Medical therapy for Myocardial infarction and Angina   Dual antiplatelet therapy with Clopidogrel and Aspirin for at least 12  months is warranted    Echo 04/05/20 ([facility]): SUMMARY  There is basal LV inferior wall hypokinesis  LV ejection fraction = 45-50%.  There is no pericardial effusion.  No significant valvular stenosis or regurgitation was detected.  There is no comparison study available.   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   Neuro/Psych Seizures -, Well Controlled,  Mild cognitive impairment Poor memory Neuromuscular disease CVA,  Residual Symptoms    GI/Hepatic Neg liver ROS, GERD  Medicated,  Endo/Other  diabetes, Well Controlled, Type 2Hypothyroidism Secondary hyperparathyroidism Hyperlipidemia  Renal/GU ESRF and DialysisRenal diseaseLast dialysis 10/19  negative genitourinary   Musculoskeletal  (+) Arthritis , Osteoarthritis,  Hx/o recent hip Fx   Abdominal   Peds  Hematology  (+) anemia , Dual Antiplatelet therapy due to recent stent OM1 Plavix and ASA therapy- last Plavix was 7 days ago   Anesthesia Other Findings   Reproductive/Obstetrics                            Anesthesia Physical  Anesthesia Plan  ASA: 4  Anesthesia Plan: MAC and Regional   Post-op Pain Management:    Induction: Intravenous  PONV Risk Score and Plan: 3 and Treatment may vary due to age or medical condition, Propofol infusion and Ondansetron  Airway Management Planned: Natural Airway and Simple Face Mask  Additional Equipment:   Intra-op Plan:   Post-operative Plan:   Informed Consent: I have reviewed the patients History and Physical, chart, labs and discussed the procedure including the risks, benefits and alternatives for the proposed anesthesia with the patient or authorized representative who has indicated his/her understanding and acceptance.     Dental advisory given  Plan Discussed with: CRNA and Anesthesiologist  Anesthesia Plan Comments: ( )       Anesthesia Quick Evaluation

## 2020-12-31 NOTE — H&P (Signed)
History and Physical Interval Note:  12/31/2020 7:33 AM  [name]  has presented today for surgery, with the diagnosis of MALFUNCTIONING ARTERIOVENOUS FISTULA.  The various methods of treatment have been discussed with the patient and family. After consideration of risks, benefits and other options for treatment, the patient has consented to  Procedure(s): LEFT ARM ARTERIOVENOUS FISTULA REVISION (Left) SIDE BRANCH LIGATION (Left) as a surgical intervention.  The patient's history has been reviewed, patient examined, no change in status, stable for surgery.  I have reviewed the patient's chart and labs.  Questions were answered to the patient's satisfaction.    Left arm AVF revision with sidebranch ligation after recent fistulogram.  Marty Heck  POST OPERATIVE OFFICE NOTE       CC:  F/u for surgery   HPI:  This is a 65 y.o. female who is s/p left BC AVF on 10/19/2020 by Dr. Oneida Alar.    Pt states she does not have pain/numbness in the left hand.   She currently dialyzes through a right TDC that was placed in [facility].     The pt is on dialysis M/W/F at [facility] location.              Allergies  Allergen Reactions   Codeine        GI issues                 Current Outpatient Medications  Medication Sig Dispense Refill   albuterol (PROVENTIL) (2.5 MG/3ML) 0.083% nebulizer solution Take 2.5 mg by nebulization every 4 (four) hours as needed for wheezing or shortness of breath.       albuterol (VENTOLIN HFA) 108 (90 Base) MCG/ACT inhaler Can inhale two puffs every four to six hours as needed for cough or wheeze. 18 g 1   aspirin 81 MG EC tablet Take 81 mg by mouth daily. Swallow whole.       atorvastatin (LIPITOR) 80 MG tablet Take 80 mg by mouth daily.       clopidogrel (PLAVIX) 75 MG tablet Take 75 mg by mouth daily.       fluticasone (FLOVENT HFA) 110 MCG/ACT inhaler INHALE 2 PUFFS BY MOUTH TWICE DAILY TO  PREVENT  COUGH  OR  WHEEZE.  RINSE  MOUTH  AFTER  USE.  (Patient taking differently: INHALE 2 PUFFS BY MOUTH TWICE DAILY TO  PREVENT  COUGH  OR  WHEEZE.  RINSE  MOUTH  AFTER  USE.) 12 g 3   hydrOXYzine (ATARAX/VISTARIL) 25 MG tablet Take 25 mg by mouth every 6 (six) hours as needed for anxiety.       levETIRAcetam (KEPPRA) 1000 MG tablet Take 1 tablet every night 90 tablet 3   levothyroxine (SYNTHROID) 200 MCG tablet Take 200 mcg by mouth daily.       Methoxy PEG-Epoetin Beta (MIRCERA IJ) Mircera       nitroGLYCERIN (NITROSTAT) 0.4 MG SL tablet Place 0.4 mg under the tongue every 5 (five) minutes as needed for chest pain.       omeprazole (PRILOSEC) 40 MG capsule TAKE 1 CAPSULE BY MOUTH IN THE MORNING AS DIRECTED 90 capsule 0   ondansetron (ZOFRAN) 4 MG tablet Take 4 mg by mouth 2 (two) times daily as needed.       SYMBICORT 160-4.5 MCG/ACT inhaler INHALE 2 PUFFS WITH SPACER TWICE DAILY TO PREVENT COUGH OR WHEEZING. RINSE, GARGLE AND SPIT AFTER USE (Patient taking differently: Inhale 2 puffs into the lungs daily as needed (  asthma).) 11 g 0   tiotropium (SPIRIVA HANDIHALER) 18 MCG inhalation capsule INHALE 1 PUFF BY MOUTH ONCE DAILY AS DIRECTED. (Patient taking differently: Place 18 mcg into inhaler and inhale 2 (two) times daily.) 30 capsule 3   vitamin B-12 (CYANOCOBALAMIN) 500 MCG tablet Take 500 mcg by mouth daily.        No current facility-administered medications for this visit.       ROS:  See HPI   Physical Exam:        Today's Vitals    11/30/20 1027  BP: 114/61  Pulse: 68  Resp: 20  Temp: 97.9 F (36.6 C)  TempSrc: Temporal  SpO2: 94%  Height: 5' (1.524 m)    Body mass index is 29.69 kg/m.     Incision:  healed nicely Extremities:   There is a palpable left radial pulse and left hand is warm. Motor and sensory are in tact.   There is a thrill/bruit present.  The fistula is easily palpable distally     Dialysis Duplex on 11/30/2020: Diameter:  0.24-0.64cm Depth:  0.52cm-1.47cm There is a competing branch in the mid UA  and a retained valve at the [facility] fossa where it is stenotic.       Assessment/Plan:  This is a 65 y.o. female who is s/p: Left BC AVF on 10/19/2020 by Dr. Oneida Alar   -the pt does not have evidence of steal. -the fistula is slow to mature with a stenosis in the [facility] fossa and is slow to mature in the upper arm where there is a competing branch.  I discussed with the pt proceeding with fistulogram and determine next step after that.  Discussed with her that the fistula may need to be superficialized and ligation of competing branch to help it mature.  Also discussed despite these efforts, the fistula may still fail to mature.   Pt is tearful but agrees to  proceed.  She is on Plavix.  -will set her up for fistulogram on non dialysis day.       Leontine Locket, [facility] [phone]   Clinic MD:  Stanford Breed.

## 2020-12-31 NOTE — Transfer of Care (Signed)
Immediate Anesthesia Transfer of Care Note  Patient: Bethany Sosa  Procedure(s) Performed: LEFT ARM ARTERIOVENOUS FISTULA REVISION (Left) SIDE BRANCH LIGATION x3 (Left)  Patient Location: PACU  Anesthesia Type:MAC and Regional  Level of Consciousness: awake, alert  and oriented  Airway & Oxygen Therapy: Patient Spontanous Breathing  Post-op Assessment: Report given to RN and Post -op Vital signs reviewed and stable  Post vital signs: Reviewed and stable  Last Vitals:  Vitals Value Taken Time  BP 126/51 12/31/20 0903  Temp    Pulse 72 12/31/20 0904  Resp 30 12/31/20 0904  SpO2 100 % 12/31/20 0904  Vitals shown include unvalidated device data.  Last Pain:  Vitals:   12/31/20 0631  TempSrc:   PainSc: 0-No pain      Patients Stated Pain Goal: 1 (15/94/58 5929)  Complications: No notable events documented.

## 2021-01-01 ENCOUNTER — Encounter (HOSPITAL_COMMUNITY): Payer: Self-pay | Admitting: Vascular Surgery

## 2021-02-09 ENCOUNTER — Telehealth: Payer: Self-pay | Admitting: Allergy and Immunology

## 2021-02-09 MED ORDER — ALBUTEROL SULFATE HFA 108 (90 BASE) MCG/ACT IN AERS: INHALATION_SPRAY | RESPIRATORY_TRACT | 0 refills | Status: DC

## 2021-02-09 NOTE — Telephone Encounter (Signed)
Patient is requesting a refill on her albuterol inhaler. She scheduled an OV for 02/16/21.  [facility] on [address]

## 2021-02-09 NOTE — Telephone Encounter (Signed)
[place] sent and patient informed.

## 2021-02-16 ENCOUNTER — Ambulatory Visit: Payer: Medicare HMO | Admitting: Allergy and Immunology

## 2021-02-17 ENCOUNTER — Encounter: Payer: Self-pay | Admitting: Allergy and Immunology

## 2021-02-17 ENCOUNTER — Other Ambulatory Visit: Payer: Self-pay

## 2021-02-17 ENCOUNTER — Ambulatory Visit (INDEPENDENT_AMBULATORY_CARE_PROVIDER_SITE_OTHER): Payer: Medicare HMO | Admitting: Allergy and Immunology

## 2021-02-17 VITALS — BP 118/64 | HR 76 | Resp 16 | Ht 59.0 in | Wt 140.0 lb

## 2021-02-17 DIAGNOSIS — K219 Gastro-esophageal reflux disease without esophagitis: Secondary | ICD-10-CM

## 2021-02-17 DIAGNOSIS — J3089 Other allergic rhinitis: Secondary | ICD-10-CM | POA: Diagnosis not present

## 2021-02-17 DIAGNOSIS — J449 Chronic obstructive pulmonary disease, unspecified: Secondary | ICD-10-CM | POA: Diagnosis not present

## 2021-02-17 NOTE — Patient Instructions (Addendum)
  1. Continue a combination of the following:   A. Spiriva 1.25 - 2 inhalations 1 -7 times per week  2. Continue reflux treatment:   A. Minimize caffeine and chocolate consumption  B. omeprazole 40 mg 1 time per day in morning  3. If needed:   A. Proair HFA or albuterol nebulization   B. Nasacort - 1 spray each nostril 1 time per day  C. antihistamine  5. Return in 6 months or earlier if problem

## 2021-02-17 NOTE — Progress Notes (Signed)
[place] [facility] [place] [facility] [place]   Follow-up Note  Referring Provider: Maggie Schwalbe, PA-C Primary Provider: Maggie Schwalbe, PA-C Date of Office Visit: 02/17/2021  Subjective:   Bethany Sosa (DOB: [birth year]) is a 65 y.o. female who returns to the [facility] on 02/17/2021 in re-evaluation of the following:  HPI: [name] returns to this clinic in reevaluation of COPD with possible asthma overlap, allergic rhinitis, and LPR.  Her last visit to this clinic was 21 August 2019.  She has had a significant change in her health status.  Apparently she received a COVID-vaccine in December 2021 at the same time that she was infected with COVID and she had a significant hypersensitivity reaction and ended up developing acute kidney failure and she is now on dialysis and placed on the transplant list.  Her breathing is actually not a significant issue at this point in time.  She no longer uses any controller agents other than some occasional Spiriva averaging out about 1 time per week.  Rarely does use a short acting bronchodilator.  She informs me that she had very little problems with her upper airways as well.  She rarely uses any nasal steroid.  She has not required a systemic steroid or antibiotic for any type of airway issue.  Her reflux is under excellent control at this point in time on omeprazole.  She will not be receiving the flu vaccine.  Allergies as of 02/17/2021       Reactions   Codeine    GI issues        Medication List    acetaminophen 500 MG tablet Commonly known as: TYLENOL Take 1,000 mg by mouth every 8 (eight) hours as needed for moderate pain.   albuterol (2.5 MG/3ML) 0.083% nebulizer solution Commonly known as: PROVENTIL Take 2.5 mg by nebulization every 4 (four) hours as needed for wheezing or shortness of breath.   albuterol 108 (90 Base) MCG/ACT inhaler Commonly known as: VENTOLIN HFA Can inhale two puffs  every four to six hours as needed for cough or wheeze.   aspirin 81 MG EC tablet Take 81 mg by mouth daily. Swallow whole.   atorvastatin 80 MG tablet Commonly known as: LIPITOR Take 80 mg by mouth daily.   clopidogrel 75 MG tablet Commonly known as: PLAVIX Take 75 mg by mouth daily.   COLLAGEN PO Take 1 capsule by mouth in the morning, at noon, and at bedtime.   famotidine 20 MG tablet Commonly known as: PEPCID Take 20 mg by mouth 2 (two) times daily.   hydrOXYzine 25 MG tablet Commonly known as: ATARAX Take 25 mg by mouth every 6 (six) hours as needed for anxiety.   levETIRAcetam 500 MG tablet Commonly known as: KEPPRA Take 1 tablet twice a day. Take additional 1 tablet after dialysis.   levothyroxine 200 MCG tablet Commonly known as: SYNTHROID Take 200 mcg by mouth daily before breakfast.   lidocaine-prilocaine cream Commonly known as: EMLA Apply 1 application topically as needed (port access).   metoprolol tartrate 25 MG tablet Commonly known as: LOPRESSOR Take 25 mg by mouth 2 (two) times daily.   MIRCERA IJ Mircera   nitroGLYCERIN 0.4 MG SL tablet Commonly known as: NITROSTAT Place 0.4 mg under the tongue every 5 (five) minutes as needed for chest pain.   omeprazole 40 MG capsule Commonly known as: PRILOSEC TAKE 1 CAPSULE BY MOUTH IN THE MORNING AS DIRECTED   ondansetron  4 MG tablet Commonly known as: ZOFRAN Take 4 mg by mouth 2 (two) times daily.   traMADol 50 MG tablet Commonly known as: Ultram Take 1 tablet (50 mg total) by mouth every 6 (six) hours as needed.   vitamin B-12 500 MCG tablet Commonly known as: CYANOCOBALAMIN Take 500 mcg by mouth daily.    Past Medical History:  Diagnosis Date   Allergic rhinitis    Anxiety    Arthritis    Asthma    Carotid artery occlusion    bilateral ICA occlusions by neck CTA, diagnosed 04/2016   COPD (chronic obstructive pulmonary disease) (HCC)    Coronary artery disease    ESRD (end stage renal  disease) ([place])    hemodialysis initiated 04/09/20 Dialysis on M/W/F   GERD (gastroesophageal reflux disease)    History of hysterectomy    Hypothyroidism    Myocardial infarction [facility])    NSTEMI, s/p DES SVG-OM1 04/30/20   Seizures ([place])    Stroke ([place]) 04/23/2016   acute/subacute left frontal CVA    Past Surgical History:  Procedure Laterality Date   A/V FISTULAGRAM N/A 12/16/2020   Procedure: A/V FISTULAGRAM;  Surgeon: Marty Heck, MD;  Location: [place] [facility];  Service: Cardiovascular;  Laterality: N/A;   AV FISTULA PLACEMENT Left 10/19/2020   Procedure: LEFT BRACHIOCEPHALIC ARTERIOVENOUS (AV) FISTULA CREATION;  Surgeon: Elam Dutch, MD;  Location: [facility];  Service: Vascular;  Laterality: Left;   CAROTID ENDARTERECTOMY Right 06/13/2005   CORONARY ARTERY BYPASS GRAFT  06/13/2005   CABG: LIMA-LAD, SVG-OM, SVG-RCA-PDA 06/13/05   LIGATION OF COMPETING BRANCHES OF ARTERIOVENOUS FISTULA Left 12/31/2020   Procedure: SIDE BRANCH LIGATION x3;  Surgeon: Marty Heck, MD;  Location: [place];  Service: Vascular;  Laterality: Left;   open heart surgery     REVISON OF ARTERIOVENOUS FISTULA Left 12/31/2020   Procedure: LEFT ARM ARTERIOVENOUS FISTULA REVISION;  Surgeon: Marty Heck, MD;  Location: [place] [facility];  Service: Vascular;  Laterality: Left;    Review of systems negative except as noted in HPI / PMHx or noted below:  Review of Systems  Constitutional: Negative.   HENT: Negative.    Eyes: Negative.   Respiratory: Negative.    Cardiovascular: Negative.   Gastrointestinal: Negative.   Genitourinary: Negative.   Musculoskeletal: Negative.   Skin: Negative.   Neurological: Negative.   Endo/Heme/Allergies: Negative.   Psychiatric/Behavioral: Negative.      Objective:   Vitals:   02/17/21 1459  BP: 118/64  Pulse: 76  Resp: 16  SpO2: 94%   Height: 4\' 11"  (149.9 cm)  Weight: 140 lb (63.5 kg)   Physical Exam Constitutional:      Appearance: She is not  diaphoretic.  HENT:     Head: Normocephalic.     Right Ear: Tympanic membrane, ear canal and external ear normal.     Left Ear: Tympanic membrane, ear canal and external ear normal.     Nose: Nose normal. No mucosal edema or rhinorrhea.     Mouth/Throat:     Pharynx: Uvula midline. No oropharyngeal exudate.  Eyes:     Conjunctiva/sclera: Conjunctivae normal.  Neck:     Thyroid: No thyromegaly.     Trachea: Trachea normal. No tracheal tenderness or tracheal deviation.  Cardiovascular:     Rate and Rhythm: Normal rate and regular rhythm.     Heart sounds: Normal heart sounds, S1 normal and S2 normal. No murmur heard. Pulmonary:     Effort: No respiratory  distress.     Breath sounds: Normal breath sounds. No stridor. No wheezing or rales.  Lymphadenopathy:     Head:     Right side of head: No tonsillar adenopathy.     Left side of head: No tonsillar adenopathy.     Cervical: No cervical adenopathy.  Skin:    Findings: No erythema or rash.     Nails: There is no clubbing.  Neurological:     Mental Status: She is alert.    Diagnostics:    Spirometry was performed and demonstrated an FEV1 of 1.02 at 53 % of predicted.  Assessment and Plan:   1. Asthma with COPD ([place])   2. Other allergic rhinitis   3. LPRD (laryngopharyngeal reflux disease)     1. Continue a combination of the following:   A. Spiriva 1.25 - 2 inhalations 1 -7 times per week  2. Continue reflux treatment:   A. Minimize caffeine and chocolate consumption  B. omeprazole 40 mg 1 time per day in morning  3. If needed:   A. Proair HFA or albuterol nebulization   B. Nasacort - 1 spray each nostril 1 time per day  C. antihistamine  5. Return in 6 months or earlier if problem  From a respiratory standpoint [name] is really doing very well and she will continue to utilize her Spiriva and her albuterol should it be required and she has a few medications that she can use for her upper airway issue as well and  she will continue to address her reflux disease with omeprazole.  Assuming she does well with this plan I will see her back in this clinic in 6 months or earlier if there is a problem.  Allena Katz, MD Allergy / Immunology [place]

## 2021-02-21 ENCOUNTER — Encounter: Payer: Self-pay | Admitting: Allergy and Immunology

## 2021-03-17 ENCOUNTER — Ambulatory Visit (INDEPENDENT_AMBULATORY_CARE_PROVIDER_SITE_OTHER): Payer: Medicare HMO | Admitting: Allergy and Immunology

## 2021-03-17 ENCOUNTER — Encounter: Payer: Self-pay | Admitting: Allergy and Immunology

## 2021-03-17 ENCOUNTER — Other Ambulatory Visit: Payer: Self-pay

## 2021-03-17 VITALS — BP 118/72 | HR 79 | Resp 16

## 2021-03-17 DIAGNOSIS — K219 Gastro-esophageal reflux disease without esophagitis: Secondary | ICD-10-CM

## 2021-03-17 DIAGNOSIS — J3089 Other allergic rhinitis: Secondary | ICD-10-CM | POA: Diagnosis not present

## 2021-03-17 DIAGNOSIS — J45901 Unspecified asthma with (acute) exacerbation: Secondary | ICD-10-CM

## 2021-03-17 DIAGNOSIS — J441 Chronic obstructive pulmonary disease with (acute) exacerbation: Secondary | ICD-10-CM

## 2021-03-17 MED ORDER — BREZTRI AEROSPHERE 160-9-4.8 MCG/ACT IN AERO: 2.0000 | INHALATION_SPRAY | Freq: Two times a day (BID) | RESPIRATORY_TRACT | 5 refills | Status: DC

## 2021-03-17 NOTE — Progress Notes (Signed)
[place] [facility] [place] [facility] [place]   Follow-up Note  Referring Provider: Maggie Schwalbe, PA-C Primary Provider: Maggie Schwalbe, PA-C Date of Office Visit: 03/17/2021  Subjective:   Bethany Sosa (DOB: 05/01/1955) is a 66 y.o. female who returns to the [facility] on 03/17/2021 in re-evaluation of the following:  HPI: [name] returns to this clinic in reevaluation of COPD/asthma overlap, allergic rhinitis, and LPR.  Her last visit to this clinic was on 17 February 2021 at which point time she was doing wonderful while using just Spiriva as her controller agent.  Unfortunately, about 1 week ago, after being exposed to leave burning smoke, she developed coughing and wheezing and shortness of breath without any nasal symptoms and without any fever and without any chest pain and without any associated systemic or constitutional symptoms.  She has been using her nebulizer several times per day.  She believes that her reflux is under good control.  Allergies as of 03/17/2021       Reactions   Codeine    GI issues        Medication List    acetaminophen 500 MG tablet Commonly known as: TYLENOL Take 1,000 mg by mouth every 8 (eight) hours as needed for moderate pain.   albuterol (2.5 MG/3ML) 0.083% nebulizer solution Commonly known as: PROVENTIL Take 2.5 mg by nebulization every 4 (four) hours as needed for wheezing or shortness of breath.   albuterol 108 (90 Base) MCG/ACT inhaler Commonly known as: VENTOLIN HFA Can inhale two puffs every four to six hours as needed for cough or wheeze.   aspirin 81 MG EC tablet Take 81 mg by mouth daily. Swallow whole.   atorvastatin 80 MG tablet Commonly known as: LIPITOR Take 80 mg by mouth daily.   clopidogrel 75 MG tablet Commonly known as: PLAVIX Take 75 mg by mouth daily.   COLLAGEN PO Take 1 capsule by mouth in the morning, at noon, and at bedtime.   famotidine 20 MG  tablet Commonly known as: PEPCID Take 20 mg by mouth 2 (two) times daily.   hydrOXYzine 25 MG tablet Commonly known as: ATARAX Take 25 mg by mouth every 6 (six) hours as needed for anxiety.   levETIRAcetam 500 MG tablet Commonly known as: KEPPRA Take 1 tablet twice a day. Take additional 1 tablet after dialysis.   levothyroxine 200 MCG tablet Commonly known as: SYNTHROID Take 200 mcg by mouth daily before breakfast.   lidocaine-prilocaine cream Commonly known as: EMLA Apply 1 application topically as needed (port access).   metoprolol tartrate 25 MG tablet Commonly known as: LOPRESSOR Take 25 mg by mouth 2 (two) times daily.   MIRCERA IJ Mircera   nitroGLYCERIN 0.4 MG SL tablet Commonly known as: NITROSTAT Place 0.4 mg under the tongue every 5 (five) minutes as needed for chest pain.   omeprazole 40 MG capsule Commonly known as: PRILOSEC TAKE 1 CAPSULE BY MOUTH IN THE MORNING AS DIRECTED   ondansetron 4 MG tablet Commonly known as: ZOFRAN Take 4 mg by mouth 2 (two) times daily.   traMADol 50 MG tablet Commonly known as: Ultram Take 1 tablet (50 mg total) by mouth every 6 (six) hours as needed.   vitamin B-12 500 MCG tablet Commonly known as: CYANOCOBALAMIN Take 500 mcg by mouth daily.    Past Medical History:  Diagnosis Date   Allergic rhinitis    Anxiety    Arthritis    Asthma  Carotid artery occlusion    bilateral ICA occlusions by neck CTA, diagnosed 04/2016   COPD (chronic obstructive pulmonary disease) (HCC)    Coronary artery disease    ESRD (end stage renal disease) ([place])    hemodialysis initiated 04/09/20 Dialysis on M/W/F   GERD (gastroesophageal reflux disease)    History of hysterectomy    Hypothyroidism    Myocardial infarction [facility])    NSTEMI, s/p DES SVG-OM1 04/30/20   Seizures ([place])    Stroke ([place]) 04/23/2016   acute/subacute left frontal CVA    Past Surgical History:  Procedure Laterality Date   A/V FISTULAGRAM N/A 12/16/2020    Procedure: A/V FISTULAGRAM;  Surgeon: Marty Heck, MD;  Location: [facility];  Service: Cardiovascular;  Laterality: N/A;   AV FISTULA PLACEMENT Left 10/19/2020   Procedure: LEFT BRACHIOCEPHALIC ARTERIOVENOUS (AV) FISTULA CREATION;  Surgeon: Elam Dutch, MD;  Location: [facility];  Service: Vascular;  Laterality: Left;   CAROTID ENDARTERECTOMY Right 06/13/2005   CORONARY ARTERY BYPASS GRAFT  06/13/2005   CABG: LIMA-LAD, SVG-OM, SVG-RCA-PDA 06/13/05   LIGATION OF COMPETING BRANCHES OF ARTERIOVENOUS FISTULA Left 12/31/2020   Procedure: SIDE BRANCH LIGATION x3;  Surgeon: Marty Heck, MD;  Location: [facility];  Service: Vascular;  Laterality: Left;   open heart surgery     REVISON OF ARTERIOVENOUS FISTULA Left 12/31/2020   Procedure: LEFT ARM ARTERIOVENOUS FISTULA REVISION;  Surgeon: Marty Heck, MD;  Location: [facility];  Service: Vascular;  Laterality: Left;    Review of systems negative except as noted in HPI / PMHx or noted below:  Review of Systems  Constitutional: Negative.   HENT: Negative.    Eyes: Negative.   Respiratory: Negative.    Cardiovascular: Negative.   Gastrointestinal: Negative.   Genitourinary: Negative.   Musculoskeletal: Negative.   Skin: Negative.   Neurological: Negative.   Endo/Heme/Allergies: Negative.   Psychiatric/Behavioral: Negative.      Objective:   Vitals:   03/17/21 1120  BP: 118/72  Pulse: 79  Resp: 16  SpO2: 95%          Physical Exam Constitutional:      Appearance: She is not diaphoretic.  HENT:     Head: Normocephalic.     Right Ear: Tympanic membrane, ear canal and external ear normal.     Left Ear: Tympanic membrane, ear canal and external ear normal.     Nose: Nose normal. No mucosal edema or rhinorrhea.     Mouth/Throat:     Pharynx: Uvula midline. No oropharyngeal exudate.  Eyes:     Conjunctiva/sclera: Conjunctivae normal.  Neck:     Thyroid: No thyromegaly.     Trachea: Trachea normal. No  tracheal tenderness or tracheal deviation.  Cardiovascular:     Rate and Rhythm: Normal rate and regular rhythm.     Heart sounds: Normal heart sounds, S1 normal and S2 normal. No murmur heard. Pulmonary:     Effort: No respiratory distress.     Breath sounds: Normal breath sounds. No stridor. No wheezing (Bilateral expiratory wheezing posterior lung fields) or rales.  Lymphadenopathy:     Head:     Right side of head: No tonsillar adenopathy.     Left side of head: No tonsillar adenopathy.     Cervical: No cervical adenopathy.  Skin:    Findings: No erythema or rash.     Nails: There is no clubbing.  Neurological:     Mental Status: She is alert.  Diagnostics:    Spirometry was performed and demonstrated an FEV1 of 0.66 at 34 % of predicted.  Assessment and Plan:   1. Asthma with COPD with exacerbation ([place])   2. Other allergic rhinitis   3. LPRD (laryngopharyngeal reflux disease)     1. Use the following medications:   A. Prednisone 10 mg - 2 tablets now + 2 tablets this afternoon  B. Prednisone 10 mg - 2 tablets daily for 7 days starting tomorrow  C. Breztri - 2 inhalations 2 times per day  D. Do NOT use Spiriva  E. Duoneb administered in clinic today  2. Continue reflux treatment:   A. Minimize caffeine and chocolate consumption  B. omeprazole 40 mg 1 time per day in morning  3. If needed:   A. Proair HFA or albuterol nebulization   B. Nasacort - 1 spray each nostril 1 time per day  C. antihistamine  4. Return in 2 weeks or earlier if problem  [name] has developed a significant inflammatory flare of her lower airway and this does coincide with her exposure to leaf burning smoke.  We will treat her with anti-inflammatory agents as noted above and regroup with her in 2 weeks or earlier if there is a problem.  She will continue to treat her reflux as noted above.  Allena Katz, MD Allergy / Immunology [place]

## 2021-03-17 NOTE — Patient Instructions (Addendum)
°  1. Use the following medications:   A. Prednisone 10 mg - 2 tablets now + 2 tablets this afternoon  B. Prednisone 10 mg - 2 tablets daily for 7 days starting tomorrow  C. Breztri - 2 inhalations 2 times per day  D. Do NOT use Spiriva  E. Duoneb administered in clinic today  2. Continue reflux treatment:   A. Minimize caffeine and chocolate consumption  B. omeprazole 40 mg 1 time per day in morning  3. If needed:   A. Proair HFA or albuterol nebulization   B. Nasacort - 1 spray each nostril 1 time per day  C. antihistamine  4. Return in 2 weeks or earlier if problem

## 2021-03-21 ENCOUNTER — Encounter: Payer: Self-pay | Admitting: Allergy and Immunology

## 2021-03-29 ENCOUNTER — Other Ambulatory Visit: Payer: Self-pay

## 2021-03-29 DIAGNOSIS — Z992 Dependence on renal dialysis: Secondary | ICD-10-CM

## 2021-03-29 DIAGNOSIS — N186 End stage renal disease: Secondary | ICD-10-CM

## 2021-03-31 ENCOUNTER — Ambulatory Visit (INDEPENDENT_AMBULATORY_CARE_PROVIDER_SITE_OTHER): Payer: Medicare HMO | Admitting: Allergy and Immunology

## 2021-03-31 ENCOUNTER — Encounter: Payer: Self-pay | Admitting: Allergy and Immunology

## 2021-03-31 ENCOUNTER — Other Ambulatory Visit: Payer: Self-pay

## 2021-03-31 VITALS — BP 112/54 | HR 76 | Resp 16

## 2021-03-31 DIAGNOSIS — J449 Chronic obstructive pulmonary disease, unspecified: Secondary | ICD-10-CM | POA: Diagnosis not present

## 2021-03-31 DIAGNOSIS — K219 Gastro-esophageal reflux disease without esophagitis: Secondary | ICD-10-CM

## 2021-03-31 DIAGNOSIS — J3089 Other allergic rhinitis: Secondary | ICD-10-CM

## 2021-03-31 NOTE — Progress Notes (Signed)
- [place]   Follow-up Note  Referring Provider: Maggie Schwalbe, PA-C Primary Provider: Maggie Schwalbe, PA-C Date of Office Visit: 03/31/2021  Subjective:   Bethany Sosa (DOB: 09-Aug-1955) is a 66 y.o. female who returns to the [facility] on 03/31/2021 in re-evaluation of the following:  HPI: Nohea presents to this clinic in reevaluation of COPD/asthma overlap, allergic rhinitis, and LPR.  I last saw her in this clinic on 17 March 2021 at which point in time she had an acute flare of her respiratory tract issue requiring the administration of systemic steroids.  Fortunately, she responded quite well to her therapy and she is basically back to baseline.  She does not require a short acting bronchodilator.  She can exert herself without too much problem to the extent that her musculoskeletal issues allow her to do so.  She has had no problems with her nose.  She has had no problems with reflux.  Allergies as of 03/31/2021       Reactions   Codeine    GI issues        Medication List    acetaminophen 500 MG tablet Commonly known as: TYLENOL Take 1,000 mg by mouth every 8 (eight) hours as needed for moderate pain.   albuterol (2.5 MG/3ML) 0.083% nebulizer solution Commonly known as: PROVENTIL Take 2.5 mg by nebulization every 4 (four) hours as needed for wheezing or shortness of breath.   albuterol 108 (90 Base) MCG/ACT inhaler Commonly known as: VENTOLIN HFA Can inhale two puffs every four to six hours as needed for cough or wheeze.   aspirin 81 MG EC tablet Take 81 mg by mouth daily. Swallow whole.   atorvastatin 80 MG tablet Commonly known as: LIPITOR Take 80 mg by mouth daily.   Breztri Aerosphere 160-9-4.8 MCG/ACT Aero Generic drug: Budeson-Glycopyrrol-Formoterol Inhale 2 puffs into the lungs in the morning and at bedtime.   clopidogrel 75 MG tablet Commonly known as: PLAVIX Take 75 mg  by mouth daily.   COLLAGEN PO Take 1 capsule by mouth in the morning, at noon, and at bedtime.   famotidine 20 MG tablet Commonly known as: PEPCID Take 20 mg by mouth 2 (two) times daily.   hydrOXYzine 25 MG tablet Commonly known as: ATARAX Take 25 mg by mouth every 6 (six) hours as needed for anxiety.   levETIRAcetam 500 MG tablet Commonly known as: KEPPRA Take 1 tablet twice a day. Take additional 1 tablet after dialysis.   levothyroxine 200 MCG tablet Commonly known as: SYNTHROID Take 200 mcg by mouth daily before breakfast.   lidocaine-prilocaine cream Commonly known as: EMLA Apply 1 application topically as needed (port access).   metoprolol tartrate 25 MG tablet Commonly known as: LOPRESSOR Take 25 mg by mouth 2 (two) times daily.   MIRCERA IJ Mircera   nitroGLYCERIN 0.4 MG SL tablet Commonly known as: NITROSTAT Place 0.4 mg under the tongue every 5 (five) minutes as needed for chest pain.   omeprazole 40 MG capsule Commonly known as: PRILOSEC TAKE 1 CAPSULE BY MOUTH IN THE MORNING AS DIRECTED   ondansetron 4 MG tablet Commonly known as: ZOFRAN Take 4 mg by mouth 2 (two) times daily.   sevelamer carbonate 800 MG tablet Commonly known as: RENVELA Take 800 mg by mouth 5 (five) times daily.   traMADol 50 MG tablet Commonly known as: Ultram Take 1 tablet (50 mg total) by mouth every 6 (  six) hours as needed.   vitamin B-12 500 MCG tablet Commonly known as: CYANOCOBALAMIN Take 500 mcg by mouth daily.    Past Medical History:  Diagnosis Date   Allergic rhinitis    Anxiety    Arthritis    Asthma    Carotid artery occlusion    bilateral ICA occlusions by neck CTA, diagnosed 04/2016   COPD (chronic obstructive pulmonary disease) (HCC)    Coronary artery disease    ESRD (end stage renal disease) ([place])    hemodialysis initiated 04/09/20 Dialysis on M/W/F   GERD (gastroesophageal reflux disease)    History of hysterectomy    Hypothyroidism    Myocardial  infarction [facility])    NSTEMI, s/p DES SVG-OM1 04/30/20   Seizures ([place])    Stroke ([place]) 04/23/2016   acute/subacute left frontal CVA    Past Surgical History:  Procedure Laterality Date   A/V FISTULAGRAM N/A 12/16/2020   Procedure: A/V FISTULAGRAM;  Surgeon: Marty Heck, MD;  Location: [facility] [facility];  Service: Cardiovascular;  Laterality: N/A;   AV FISTULA PLACEMENT Left 10/19/2020   Procedure: LEFT BRACHIOCEPHALIC ARTERIOVENOUS (AV) FISTULA CREATION;  Surgeon: Elam Dutch, MD;  Location: [facility];  Service: Vascular;  Laterality: Left;   CAROTID ENDARTERECTOMY Right 06/13/2005   CORONARY ARTERY BYPASS GRAFT  06/13/2005   CABG: LIMA-LAD, SVG-OM, SVG-RCA-PDA 06/13/05   LIGATION OF COMPETING BRANCHES OF ARTERIOVENOUS FISTULA Left 12/31/2020   Procedure: SIDE BRANCH LIGATION x3;  Surgeon: Marty Heck, MD;  Location: [facility];  Service: Vascular;  Laterality: Left;   open heart surgery     REVISON OF ARTERIOVENOUS FISTULA Left 12/31/2020   Procedure: LEFT ARM ARTERIOVENOUS FISTULA REVISION;  Surgeon: Marty Heck, MD;  Location: [facility];  Service: Vascular;  Laterality: Left;    Review of systems negative except as noted in HPI / PMHx or noted below:  Review of Systems  Constitutional: Negative.   HENT: Negative.    Eyes: Negative.   Respiratory: Negative.    Cardiovascular: Negative.   Gastrointestinal: Negative.   Genitourinary: Negative.   Musculoskeletal: Negative.   Skin: Negative.   Neurological: Negative.   Endo/Heme/Allergies: Negative.   Psychiatric/Behavioral: Negative.      Objective:   Vitals:   03/31/21 1054  BP: (!) 112/54  Pulse: 76  Resp: 16  SpO2: 96%          Physical Exam Constitutional:      Appearance: She is not diaphoretic.  HENT:     Head: Normocephalic.     Right Ear: Tympanic membrane, ear canal and external ear normal.     Left Ear: Tympanic membrane, ear canal and external ear normal.     Nose: Nose normal. No  mucosal edema or rhinorrhea.     Mouth/Throat:     Pharynx: Uvula midline. No oropharyngeal exudate.  Eyes:     Conjunctiva/sclera: Conjunctivae normal.  Neck:     Thyroid: No thyromegaly.     Trachea: Trachea normal. No tracheal tenderness or tracheal deviation.  Cardiovascular:     Rate and Rhythm: Normal rate and regular rhythm.     Heart sounds: Normal heart sounds, S1 normal and S2 normal. No murmur heard. Pulmonary:     Effort: No respiratory distress.     Breath sounds: Normal breath sounds. No stridor. No wheezing or rales.  Lymphadenopathy:     Head:     Right side of head: No tonsillar adenopathy.     Left side of  head: No tonsillar adenopathy.     Cervical: No cervical adenopathy.  Skin:    Findings: No erythema or rash.     Nails: There is no clubbing.  Neurological:     Mental Status: She is alert.    Diagnostics:    Spirometry was performed and demonstrated an FEV1 of 0.78 at 41 % of predicted.  Assessment and Plan:   1. Asthma with COPD ([place])   2. Other allergic rhinitis   3. LPRD (laryngopharyngeal reflux disease)     1.  Continue to treat and prevent inflammation:   A. Breztri - 2 inhalations 1-2 times per day  2. Continue reflux treatment:   A. Minimize caffeine and chocolate consumption  B. omeprazole 40 mg 1 time per day in morning  3. If needed:   A. Proair HFA or albuterol nebulization   B. Nasacort - 1 spray each nostril 1 time per day  C. antihistamine  4. Return in 10 weeks or earlier if problem.  Taper medications???  Bethany Sosa is doing very well.  I would like for her to remain on a combination inhaler for at least the next 10 weeks.  It should be noted that prior to her exacerbation addressed 2 weeks ago she was really doing very well for a prolonged period in time while just using Spiriva as her only controller agent.  We may be able to have her revert back to using just Spiriva pending her response to treatment at the end of 12 weeks of  combination treatment.  Allena Katz, MD Allergy / Immunology [place]

## 2021-03-31 NOTE — Patient Instructions (Signed)
°  1.  Continue to treat and prevent inflammation:   A. Breztri - 2 inhalations 1-2 times per day  2. Continue reflux treatment:   A. Minimize caffeine and chocolate consumption  B. omeprazole 40 mg 1 time per day in morning  3. If needed:   A. Proair HFA or albuterol nebulization   B. Nasacort - 1 spray each nostril 1 time per day  C. antihistamine  4. Return in 10 weeks or earlier if problem.  Taper medications???

## 2021-04-04 ENCOUNTER — Encounter: Payer: Self-pay | Admitting: Allergy and Immunology

## 2021-04-12 ENCOUNTER — Ambulatory Visit (INDEPENDENT_AMBULATORY_CARE_PROVIDER_SITE_OTHER): Payer: Medicare HMO | Admitting: Physician Assistant

## 2021-04-12 ENCOUNTER — Other Ambulatory Visit: Payer: Self-pay

## 2021-04-12 ENCOUNTER — Ambulatory Visit (HOSPITAL_COMMUNITY)
Admission: RE | Admit: 2021-04-12 | Discharge: 2021-04-12 | Disposition: A | Discharge status: Home / Self Care | Payer: Medicare HMO | Source: Ambulatory Visit | Attending: Vascular Surgery | Admitting: Vascular Surgery

## 2021-04-12 VITALS — BP 125/68 | HR 69 | Temp 97.9°F | Ht 59.0 in | Wt 138.5 lb

## 2021-04-12 DIAGNOSIS — Z992 Dependence on renal dialysis: Secondary | ICD-10-CM

## 2021-04-12 DIAGNOSIS — N186 End stage renal disease: Secondary | ICD-10-CM

## 2021-04-12 NOTE — Progress Notes (Signed)
°  POST OPERATIVE OFFICE NOTE    CC:  F/u for surgery  HPI:  This is a 66 y.o. female who is s/p L brachiocephalic fistula side branch ligation 12/2020.  Fistula was initially created 10/2020 but was slow to mature.  She is dialyzing via [facility] in [place] on a MWF schedule.  She denies signs and symptoms of steal syndrome.  Allergies  Allergen Reactions   Codeine     GI issues    Current Outpatient Medications  Medication Sig Dispense Refill   acetaminophen (TYLENOL) 500 MG tablet Take 1,000 mg by mouth every 8 (eight) hours as needed for moderate pain.     albuterol (PROVENTIL) (2.5 MG/3ML) 0.083% nebulizer solution Take 2.5 mg by nebulization every 4 (four) hours as needed for wheezing or shortness of breath.     albuterol (VENTOLIN HFA) 108 (90 Base) MCG/ACT inhaler Can inhale two puffs every four to six hours as needed for cough or wheeze. 18 g 0   aspirin 81 MG EC tablet Take 81 mg by mouth daily. Swallow whole.     atorvastatin (LIPITOR) 80 MG tablet Take 80 mg by mouth daily.     Budeson-Glycopyrrol-Formoterol (BREZTRI AEROSPHERE) 160-9-4.8 MCG/ACT AERO Inhale 2 puffs into the lungs in the morning and at bedtime. 10.7 g 5   clopidogrel (PLAVIX) 75 MG tablet Take 75 mg by mouth daily.     COLLAGEN PO Take 1 capsule by mouth in the morning, at noon, and at bedtime.     famotidine (PEPCID) 20 MG tablet Take 20 mg by mouth 2 (two) times daily.     hydrOXYzine (ATARAX/VISTARIL) 25 MG tablet Take 25 mg by mouth every 6 (six) hours as needed for anxiety.     levETIRAcetam (KEPPRA) 500 MG tablet Take 1 tablet twice a day. Take additional 1 tablet after dialysis. 75 tablet 11   levothyroxine (SYNTHROID) 200 MCG tablet Take 200 mcg by mouth daily before breakfast.     lidocaine-prilocaine (EMLA) cream Apply 1 application topically as needed (port access).     metoprolol tartrate (LOPRESSOR) 25 MG tablet Take 25 mg by mouth 2 (two) times daily.     nitroGLYCERIN (NITROSTAT) 0.4 MG SL  tablet Place 0.4 mg under the tongue every 5 (five) minutes as needed for chest pain.     omeprazole (PRILOSEC) 40 MG capsule TAKE 1 CAPSULE BY MOUTH IN THE MORNING AS DIRECTED 90 capsule 0   ondansetron (ZOFRAN) 4 MG tablet Take 4 mg by mouth 2 (two) times daily.     vitamin B-12 (CYANOCOBALAMIN) 500 MCG tablet Take 500 mcg by mouth daily.     No current facility-administered medications for this visit.     ROS:  See HPI  Physical Exam:  Vitals:   04/12/21 1023  BP: 125/68  Pulse: 69  Temp: 97.9 F (36.6 C)  Weight: 138 lb 8 oz (62.8 kg)  Height: 4\' 11"  (1.499 m)    Incision:  L arm incisions healed Extremities:  palpable thrill through cephalic vein fistula   Assessment/Plan:  This is a 66 y.o. female who is s/p L brachiocephalic fistula side branch ligation  - Patent L brachiocephalic fistula with palpable thrill; duplex shows a widely patent fistula without flow limiting stenosis - Ok to access fistula starting 04/18/21 - TDC can be removed when Nephrology is comfortable with the performance of the fistula -Follow up prn   Dagoberto Ligas, PA-C [facility] [phone]  Clinic MD:  Carlis Abbott

## 2021-05-18 NOTE — Patient Instructions (Signed)
DUE TO COVID-19 ONLY ONE VISITOR  (aged 68 and older)  IS ALLOWED TO COME WITH YOU AND STAY IN THE WAITING ROOM ONLY DURING PRE OP AND PROCEDURE.   **NO VISITORS ARE ALLOWED IN THE SHORT STAY AREA OR RECOVERY ROOM!!**  IF YOU WILL BE ADMITTED INTO THE HOSPITAL YOU ARE ALLOWED ONLY TWO SUPPORT PEOPLE DURING VISITATION HOURS ONLY (7 AM -8PM)   The support person(s) must pass our screening, gel in and out, and wear a mask at all times, including in the patients room. Patients must also wear a mask when staff or their support person are in the room. Visitors GUEST BADGE MUST BE WORN VISIBLY  One adult visitor may remain with you overnight and MUST be in the room by 8 P.M.     COVID SWAB TESTING MUST BE COMPLETED ON:  05/27/20    (*ARRIVE AT YOUR APPOINTMENT TIME STAFF IS NOT HERE BEFORE 8AM!!!*)    Site: [facility] [address] [place] Enter: Main Entrance have a seat in the waiting area to the right of main entrance (DO NOT [place]!!!!!) Dial: [phone] to alert staff you have arrived  You are not required to quarantine, however you are required to wear a well-fitted mask when you are out and around people not in your household.  Hand Hygiene often Do NOT share personal items Notify your provider if you are in close contact with someone who has COVID or you develop fever 100.4 or greater, new onset of sneezing, cough, sore throat, shortness of breath or body aches.  [address] Sosa, [address], must go inside of the hospital, NOT A DRIVE THRU!  (Must self quarantine after testing. Follow instructions on handout.)       Your procedure is scheduled on: 05/31/21   Report to [facility] Main Entrance    Report to admitting at : 8:45 AM   Call this number if you have problems the morning of surgery [phone]   Do not eat food :After Midnight.   After Midnight you may have the  following liquids until : 8:30 AM DAY OF SURGERY  Water Black Coffee (sugar ok, NO MILK/CREAM OR CREAMERS)  Tea (sugar ok, NO MILK/CREAM OR CREAMERS) regular and decaf                             Plain Jell-O (NO RED)                                           Fruit ices (not with fruit pulp, NO RED)                                     Popsicles (NO RED)                                                                  Juice: apple, WHITE grape, WHITE cranberry Sports drinks like Gatorade (NO RED) Clear broth(vegetable,chicken,beef)  Drink Ensure drink AT : 8:30 AM the day of surgery.     The day of surgery:  Drink ONE (1) Pre-Surgery Clear Ensure or G2 at AM the morning of surgery. Drink in one sitting. Do not sip.  This drink was given to you during your hospital  pre-op appointment visit. Nothing else to drink after completing the  Pre-Surgery Clear Ensure or G2.          If you have questions, please contact your surgeons office.   Oral Hygiene is also important to reduce your risk of infection.                                    Remember - BRUSH YOUR TEETH THE MORNING OF SURGERY WITH YOUR REGULAR TOOTHPASTE   Do NOT smoke after Midnight   Take these medicines the morning of surgery with A SIP OF WATER: kepra,metoprolol,synthroid,famotidine,omeprazole.Use inhalers as usual.Hydroxyzine as needed.  DO NOT TAKE ANY ORAL DIABETIC MEDICATIONS DAY OF YOUR SURGERY  Bring CPAP mask and tubing day of surgery.                              You may not have any metal on your body including hair pins, jewelry, and body piercing             Do not wear make-up, lotions, powders, perfumes/cologne, or deodorant  Do not wear nail polish including gel and S&S, artificial/acrylic nails, or any other type of covering on natural nails including finger and toenails. If you have artificial nails, gel coating, etc. that needs to be removed by a nail salon please have this removed prior  to surgery or surgery may need to be canceled/ delayed if the surgeon/ anesthesia feels like they are unable to be safely monitored.   Do not shave  48 hours prior to surgery.   Do not bring valuables to the hospital. [address].   Contacts, dentures or bridgework may not be worn into surgery.   Bring small overnight bag day of surgery.    Patients discharged on the day of surgery will not be allowed to drive home.  Someone NEEDS to stay with you for the first 24 hours after anesthesia.   Special Instructions: Bring a copy of your healthcare power of attorney and living will documents         the day of surgery if you haven't scanned them before.              Please read over the following fact sheets you were given: IF YOU HAVE QUESTIONS ABOUT YOUR PRE-OP INSTRUCTIONS PLEASE CALL [phone]     [facility] - Preparing for Surgery Before surgery, you can play an important role.  Because skin is not sterile, your skin needs to be as free of germs as possible.  You can reduce the number of germs on your skin by washing with CHG (chlorahexidine gluconate) soap before surgery.  CHG is an antiseptic cleaner which kills germs and bonds with the skin to continue killing germs even after washing. Please DO NOT use if you have an allergy to CHG or antibacterial soaps.  If your skin becomes reddened/irritated stop using the CHG and inform your nurse when you  arrive at Short Stay. Do not shave (including legs and underarms) for at least 48 hours prior to the first CHG shower.  You may shave your face/neck. Please follow these instructions carefully:  1.  Shower with CHG Soap the night before surgery and the  morning of Surgery.  2.  If you choose to wash your hair, wash your hair first as usual with your  normal  shampoo.  3.  After you shampoo, rinse your hair and body thoroughly to remove the  shampoo.                           4.  Use CHG as you would  any other liquid soap.  You can apply chg directly  to the skin and wash                       Gently with a scrungie or clean washcloth.  5.  Apply the CHG Soap to your body ONLY FROM THE NECK DOWN.   Do not use on face/ open                           Wound or open sores. Avoid contact with eyes, ears mouth and genitals (private parts).                       Wash face,  Genitals (private parts) with your normal soap.             6.  Wash thoroughly, paying special attention to the area where your surgery  will be performed.  7.  Thoroughly rinse your body with warm water from the neck down.  8.  DO NOT shower/wash with your normal soap after using and rinsing off  the CHG Soap.                9.  Pat yourself dry with a clean towel.            10.  Wear clean pajamas.            11.  Place clean sheets on your bed the night of your first shower and do not  sleep with pets. Day of Surgery : Do not apply any lotions/deodorants the morning of surgery.  Please wear clean clothes to the hospital/surgery center.  FAILURE TO FOLLOW THESE INSTRUCTIONS MAY RESULT IN THE CANCELLATION OF YOUR SURGERY PATIENT SIGNATURE_________________________________  NURSE SIGNATURE__________________________________  ________________________________________________________________________   Bethany Sosa  An incentive spirometer is a tool that can help keep your lungs clear and active. This tool measures how well you are filling your lungs with each breath. Taking long deep breaths may help reverse or decrease the chance of developing breathing (pulmonary) problems (especially infection) following: A long period of time when you are unable to move or be active. BEFORE THE PROCEDURE  If the spirometer includes an indicator to show your best effort, your nurse or respiratory therapist will set it to a desired goal. If possible, sit up straight or lean slightly forward. Try not to slouch. Hold the incentive  spirometer in an upright position. INSTRUCTIONS FOR USE  Sit on the edge of your bed if possible, or sit up as far as you can in bed or on a chair. Hold the incentive spirometer in an upright position. Breathe out normally. Place the mouthpiece in your mouth and  seal your lips tightly around it. Breathe in slowly and as deeply as possible, raising the piston or the ball toward the top of the column. Hold your breath for 3-5 seconds or for as long as possible. Allow the piston or ball to fall to the bottom of the column. Remove the mouthpiece from your mouth and breathe out normally. Rest for a few seconds and repeat Steps 1 through 7 at least 10 times every 1-2 hours when you are awake. Take your time and take a few normal breaths between deep breaths. The spirometer may include an indicator to show your best effort. Use the indicator as a goal to work toward during each repetition. After each set of 10 deep breaths, practice coughing to be sure your lungs are clear. If you have an incision (the cut made at the time of surgery), support your incision when coughing by placing a pillow or rolled up towels firmly against it. Once you are able to get out of bed, walk around indoors and cough well. You may stop using the incentive spirometer when instructed by your caregiver.  RISKS AND COMPLICATIONS Take your time so you do not get dizzy or light-headed. If you are in pain, you may need to take or ask for pain medication before doing incentive spirometry. It is harder to take a deep breath if you are having pain. AFTER USE Rest and breathe slowly and easily. It can be helpful to keep track of a log of your progress. Your caregiver can provide you with a simple table to help with this. If you are using the spirometer at home, follow these instructions: Dawn IF:  You are having difficultly using the spirometer. You have trouble using the spirometer as often as instructed. Your pain  medication is not giving enough relief while using the spirometer. You develop fever of 100.5 F (38.1 C) or higher. SEEK IMMEDIATE MEDICAL CARE IF:  You cough up bloody sputum that had not been present before. You develop fever of 102 F (38.9 C) or greater. You develop worsening pain at or near the incision site. MAKE SURE YOU:  Understand these instructions. Will watch your condition. Will get help right away if you are not doing well or get worse. Document Released: 07/10/2006 Document Revised: 05/22/2011 Document Reviewed: 09/10/2006 [facility] Patient Information 2014 [place], [place].   ________________________________________________________________________

## 2021-05-19 ENCOUNTER — Other Ambulatory Visit: Payer: Self-pay

## 2021-05-19 ENCOUNTER — Encounter (HOSPITAL_COMMUNITY): Payer: Self-pay

## 2021-05-19 ENCOUNTER — Encounter (HOSPITAL_COMMUNITY)
Admission: RE | Admit: 2021-05-19 | Discharge: 2021-05-19 | Disposition: A | Discharge status: Home / Self Care | Payer: Medicare HMO | Source: Ambulatory Visit | Attending: Orthopedic Surgery | Admitting: Orthopedic Surgery

## 2021-05-19 DIAGNOSIS — Z20822 Contact with and (suspected) exposure to covid-19: Secondary | ICD-10-CM | POA: Insufficient documentation

## 2021-05-19 DIAGNOSIS — M1611 Unilateral primary osteoarthritis, right hip: Secondary | ICD-10-CM | POA: Insufficient documentation

## 2021-05-19 DIAGNOSIS — Z01812 Encounter for preprocedural laboratory examination: Secondary | ICD-10-CM | POA: Insufficient documentation

## 2021-05-19 DIAGNOSIS — Z8673 Personal history of transient ischemic attack (TIA), and cerebral infarction without residual deficits: Secondary | ICD-10-CM | POA: Diagnosis not present

## 2021-05-19 DIAGNOSIS — J449 Chronic obstructive pulmonary disease, unspecified: Secondary | ICD-10-CM | POA: Insufficient documentation

## 2021-05-19 DIAGNOSIS — Z01818 Encounter for other preprocedural examination: Secondary | ICD-10-CM

## 2021-05-19 DIAGNOSIS — Z951 Presence of aortocoronary bypass graft: Secondary | ICD-10-CM | POA: Diagnosis not present

## 2021-05-19 DIAGNOSIS — I2510 Atherosclerotic heart disease of native coronary artery without angina pectoris: Secondary | ICD-10-CM | POA: Diagnosis not present

## 2021-05-19 DIAGNOSIS — I252 Old myocardial infarction: Secondary | ICD-10-CM | POA: Diagnosis not present

## 2021-05-19 HISTORY — DX: Cardiac murmur, unspecified: R01.1

## 2021-05-19 NOTE — Progress Notes (Signed)
For Short Stay: ?[place] SWAB appointment date: DOS ?Date of COVID positive in last 90 days: N/A ?COVID Vaccine: Pfizer x 3: 2021 ?Bowel Prep reminder: N/A ? ? ?For Anesthesia: ?PCP - Reinaldo Nodal: PAC. LOV: 04/14/21 ?Cardiologist - Dr. Ed Blalock. LOV: 02/28/21 ? ?Chest x-ray -  ?EKG - 10/20/20 ?Stress Test -  ?ECHO -  ?Cardiac Cath -  ?Pacemaker/ICD device last checked: ?Pacemaker orders received: ?Device Rep notified: ? ?Spinal Cord Stimulator: ? ?Sleep Study -  ?CPAP -  ? ?Fasting Blood Sugar -  ?Checks Blood Sugar _____ times a day ?Date and result of last Hgb A1c- 04/14/21: 4.7 ? ?Blood Thinner Instructions: Plavix will be held 5 days before surgery as per Dr. Alvan Dame instructions.Aspirin will continue. ?Aspirin Instructions: ?Last Dose: ? ?Activity level: Can go up a flight of stairs and activities of daily living without stopping and without chest pain and/or shortness of breath ?  Able to exercise without chest pain and/or shortness of breath ?  Unable to go up a flight of stairs without chest pain and/or shortness of breath ?   ? ?Anesthesia review: Hx: Stroke,CAD,CABG,COPD,ESRD: HD (M_W_F),Seizures(last was more than 5 years ago) ? ?Patient denies shortness of breath, fever, cough and chest pain at PAT appointment ? ? ?Patient verbalized understanding of instructions that were given to them at the PAT appointment. Patient was also instructed that they will need to review over the PAT instructions again at home before surgery.  ?

## 2021-05-19 NOTE — Progress Notes (Signed)
PAT interview was done over the phone.As per pt. She will be not able to pick up ensure and soup,instructions were done with pt.,recommendations to use dial soup for the two showers were given.Pt. lives in [place] and go to dialysis 3 times a week. ?

## 2021-05-20 NOTE — Progress Notes (Signed)
Anesthesia Chart Review ? ? Case: [id number] Date/Time: 05/31/21 1115  ? Procedure: TOTAL HIP ARTHROPLASTY ANTERIOR APPROACH (Right: Hip)  ? Anesthesia type: Spinal  ? Pre-op diagnosis: Right hip osteoarthitis  ? Location: [facility] ROOM 10 / [facility]  ? Surgeons: Paralee Cancel, MD  ? ?  ? ? ?DISCUSSION:65 y.o. former smoker with h/o COPD, CAD (CABG), Stroke, ESRD on dialysis MWF, right hip OA scheduled for above procedure 05/31/2021 with Dr. Paralee Cancel.  ? ?Pt seen by cardiology 02/14/2021 for preoperative evaluation. Per OV note, "She wants to have hip replacement surgery as well as possible renal transplant in the future and asked about cardiac risk assessment. ?She has a history of CAD, CABG, NSTEMI, and DES. She is not having angina. ?She has a history of EF 40-50% but no symptoms of CHF. ?History of stroke. ?Not on insulin. ?Renal failure, on HD. ?Therefore her risk of major cardiac event is ~15%. I will order echo to see if LV function has improved. She has been revascularized and is not having angina or CHF so I do not see that we can modify her risk further." ? ?Echo 02/28/2021 with EF 50-55%, mild to moderate pulmonary hypertension.  ? ?Anticipate pt can proceed with planned procedure barring acute status change.   ?VS: There were no vitals taken for this visit. ? ?PROVIDERS: ?Nodal, Alphonzo Dublin, PA-C is PCP  ?  ? ?LABS:  labs DOS ?(all labs ordered are listed, but only abnormal results are displayed) ? ?Labs Reviewed - No data to display ? ? ?IMAGES: ? ? ?EKG: ?10/20/2020 ?Rate 97 bpm  ?Sinus rhythm with occasional premautre ventricular complexes ?Minimal voltage criteria for LVH, may be normal variant ?ST & T wave abnormality consider inferior ischemia ? ?CV: ?Echo 02/28/2021 ?SUMMARY  ?Technically difficult study  ?Global LV systolic function is preserved  ?There is basal LV inferior wall akinesis  ?LV ejection fraction = 50-55%.  ?The aortic valve is not well visualized.  ?There is mild aortic stenosis.  ?There  is no aortic regurgitation.  ?There is mild to moderate mitral annular calcification.  ?There is trace mitral regurgitation.  ?There is mild tricuspid regurgitation.  ?Estimated right ventricular systolic pressure is 43 mmHg.  ?Mild to moderate pulmonary hypertension.  ?Probably no significant change in comparison with the prior study  ?noted  ?Past Medical History:  ?Diagnosis Date  ? Allergic rhinitis   ? Anxiety   ? Arthritis   ? Asthma   ? Carotid artery occlusion   ? bilateral ICA occlusions by neck CTA, diagnosed 04/2016  ? COPD (chronic obstructive pulmonary disease) ([place])   ? Coronary artery disease   ? ESRD (end stage renal disease) ([place])   ? hemodialysis initiated 04/09/20 Dialysis on M/W/F  ? GERD (gastroesophageal reflux disease)   ? Heart murmur   ? History of hysterectomy   ? Hypothyroidism   ? Myocardial infarction [facility])   ? NSTEMI, s/p DES SVG-OM1 04/30/20  ? Seizures ([place])   ? Stroke [facility]) 04/23/2016  ? acute/subacute left frontal CVA  ? ? ?Past Surgical History:  ?Procedure Laterality Date  ? A/V FISTULAGRAM N/A 12/16/2020  ? Procedure: A/V FISTULAGRAM;  Surgeon: Marty Heck, MD;  Location: [place] [facility];  Service: Cardiovascular;  Laterality: N/A;  ? APPENDECTOMY    ? AV FISTULA PLACEMENT Left 10/19/2020  ? Procedure: LEFT BRACHIOCEPHALIC ARTERIOVENOUS (AV) FISTULA CREATION;  Surgeon: Elam Dutch, MD;  Location: [place];  Service: Vascular;  Laterality: Left;  ?  CAROTID ENDARTERECTOMY Right 06/13/2005  ? CORONARY ARTERY BYPASS GRAFT  06/13/2005  ? CABG: LIMA-LAD, SVG-OM, SVG-RCA-PDA 06/13/05  ? LIGATION OF COMPETING BRANCHES OF ARTERIOVENOUS FISTULA Left 12/31/2020  ? Procedure: SIDE BRANCH LIGATION x3;  Surgeon: Marty Heck, MD;  Location: [place];  Service: Vascular;  Laterality: Left;  ? open heart surgery    ? REVISON OF ARTERIOVENOUS FISTULA Left 12/31/2020  ? Procedure: LEFT ARM ARTERIOVENOUS FISTULA REVISION;  Surgeon: Marty Heck, MD;  Location: [place];   Service: Vascular;  Laterality: Left;  ? ? ?MEDICATIONS: ? acetaminophen (TYLENOL) 500 MG tablet  ? albuterol (PROVENTIL) (2.5 MG/3ML) 0.083% nebulizer solution  ? albuterol (VENTOLIN HFA) 108 (90 Base) MCG/ACT inhaler  ? aspirin 81 MG EC tablet  ? atorvastatin (LIPITOR) 80 MG tablet  ? Budeson-Glycopyrrol-Formoterol (BREZTRI AEROSPHERE) 160-9-4.8 MCG/ACT AERO  ? clopidogrel (PLAVIX) 75 MG tablet  ? COLLAGEN PO  ? famotidine (PEPCID) 20 MG tablet  ? hydrOXYzine (ATARAX/VISTARIL) 25 MG tablet  ? levETIRAcetam (KEPPRA) 500 MG tablet  ? levothyroxine (SYNTHROID) 200 MCG tablet  ? lidocaine-prilocaine (EMLA) cream  ? metoprolol tartrate (LOPRESSOR) 25 MG tablet  ? nitroGLYCERIN (NITROSTAT) 0.4 MG SL tablet  ? omeprazole (PRILOSEC) 40 MG capsule  ? ondansetron (ZOFRAN) 4 MG tablet  ? vitamin B-12 (CYANOCOBALAMIN) 500 MCG tablet  ? ?No current facility-administered medications for this encounter.  ? ?Konrad Felix Ward, PA-C ?[facility]-Surgical [facility] ?[phone] ? ? ? ? ? ? ? ?

## 2021-05-20 NOTE — Anesthesia Preprocedure Evaluation (Signed)
Anesthesia Evaluation    Airway        Dental   Pulmonary former smoker,           Cardiovascular      Neuro/Psych    GI/Hepatic   Endo/Other    Renal/GU      Musculoskeletal   Abdominal   Peds  Hematology   Anesthesia Other Findings   Reproductive/Obstetrics                             Anesthesia Physical Anesthesia Plan  ASA:   Anesthesia Plan:    Post-op Pain Management:    Induction:   PONV Risk Score and Plan:   Airway Management Planned:   Additional Equipment:   Intra-op Plan:   Post-operative Plan:   Informed Consent:   Plan Discussed with:   Anesthesia Plan Comments: (See PAT note 05/19/2021, Konrad Felix Ward, PA-C)        Anesthesia Quick Evaluation

## 2021-05-26 ENCOUNTER — Ambulatory Visit: Payer: Medicare HMO | Admitting: Neurology

## 2021-05-26 NOTE — H&P (Signed)
TOTAL HIP ADMISSION H&P ? ?Patient is admitted for right total hip arthroplasty. ? ?Subjective: ? ?Chief Complaint: right hip pain ? ?HPI: KALYNN DECLERCQ, 66 y.o. female, has a history of pain and functional disability in the right hip(s) due to arthritis and patient has failed non-surgical conservative treatments for greater than 12 weeks to include NSAID's and/or analgesics, corticosteriod injections, and activity modification.  Onset of symptoms was gradual starting 2 years ago with gradually worsening course since that time.The patient noted no past surgery on the right hip(s).  Patient currently rates pain in the right hip at 9 out of 10 with activity. Patient has worsening of pain with activity and weight bearing, pain that interfers with activities of daily living, and pain with passive range of motion. Patient has evidence of joint space narrowing by imaging studies. This condition presents safety issues increasing the risk of falls  There is no current active infection. ? ?Patient Active Problem List  ? Diagnosis Date Noted  ? Status post coronary artery stent placement 08/22/2020  ? Complication of vascular dialysis catheter 08/04/2020  ? End stage renal disease ([place]) 07/27/2020  ? Hypokalemia 07/27/2020  ? Pain, unspecified 07/27/2020  ? Muscle weakness (generalized) 06/07/2020  ? Unspecified severe protein-calorie malnutrition ([place]) 05/14/2020  ? Allergy, unspecified, initial encounter 05/07/2020  ? Anaphylactic shock, unspecified, initial encounter 05/07/2020  ? Anemia, unspecified 05/07/2020  ? Coagulation defect, unspecified ([place]) 05/07/2020  ? Disease characterized by destruction of skeletal muscle 05/07/2020  ? Encounter for screening for respiratory tuberculosis 05/07/2020  ? Secondary hyperparathyroidism of renal origin ([place]) 05/07/2020  ? Type 2 diabetes mellitus with unspecified complications ([place]) [id number]  ? COVID-19 05/04/2020  ? Ischemic cardiomyopathy 05/04/2020  ? Long-term use of  aspirin therapy 05/04/2020  ? S/P CABG (coronary artery bypass graft) 05/04/2020  ? Fatigue 03/31/2020  ? Iron deficiency anemia due to chronic blood loss 03/31/2020  ? Laceration of neck 03/07/2020  ? Need for Tdap vaccination 03/07/2020  ? History of hip replacement 09/09/2019  ? Morbid obesity ([place]) 08/03/2019  ? Osteoarthritis of right hip 11/29/2018  ? Pedal edema 11/21/2018  ? Osteoporotic hip fracture ([place]) 10/15/2018  ? Sciatica 10/15/2018  ? Spondylolisthesis, congenital 10/15/2018  ? Lower respiratory infection (e.g., bronchitis, pneumonia, pneumonitis, pulmonitis) 03/14/2018  ? Acute right-sided low back pain without sciatica 01/09/2018  ? LPRD (laryngopharyngeal reflux disease) 07/06/2017  ? Abnormal urinalysis 06/25/2017  ? Essential hypertension 06/25/2017  ? Hypertensive heart disease with congestive heart failure ([place]) 11/02/2016  ? Localization-related (focal) (partial) symptomatic epilepsy and epileptic syndromes with complex partial seizures, not intractable, without status epilepticus ([place]) 08/15/2016  ? Hx of ischemic right ACA stroke 08/15/2016  ? UTI (urinary tract infection) 06/25/2016  ? Orthostatic hypotension 06/24/2016  ? Seizure ([place]) 06/24/2016  ? Acute kidney injury ([place]) 06/24/2016  ? Leukocytosis 06/24/2016  ? Seizure-like activity ([place]) 06/24/2016  ? AKI (acute kidney injury) ([place])   ? Dehydration   ? MCI (mild cognitive impairment) with memory loss 06/23/2016  ? Cerebrovascular accident (CVA) ([place]) 04/28/2016  ? 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 Gait disturbance, post-stroke 04/28/2016  ? GERD (gastroesophageal reflux disease) 12/07/2015  ? Hypothyroidism 05/29/2015  ? CAD (coronary artery disease) 05/29/2015  ? Hyperlipidemia 05/29/2015  ? Pain of right hip joint 05/29/2015  ? COPD (chronic obstructive pulmonary disease) ([place]) 11/19/2014  ? Moderate persistent asthma 11/19/2014  ? Other allergic rhinitis 11/19/2014  ? Peripheral vascular disease ([place]) 08/06/2012  ?  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 ?Past Medical History:  ?Diagnosis Date  ? Allergic rhinitis   ? Anxiety   ? Arthritis   ? Asthma   ? Carotid artery occlusion   ? bilateral ICA occlusions by neck CTA, diagnosed 04/2016  ? COPD (chronic obstructive pulmonary disease) ([place])   ? Coronary artery disease   ? ESRD (end stage renal disease) ([place])   ? hemodialysis initiated 04/09/20 Dialysis on M/W/F  ? GERD (gastroesophageal reflux disease)   ? Heart murmur   ? History of hysterectomy   ? Hypothyroidism   ? Myocardial infarction [facility])   ? NSTEMI, s/p DES SVG-OM1 04/30/20  ? Seizures ([place])   ? Stroke [facility]) 04/23/2016  ? acute/subacute left frontal CVA  ?  ?Past Surgical History:  ?Procedure Laterality Date  ? A/V FISTULAGRAM N/A 12/16/2020  ? Procedure: A/V FISTULAGRAM;  Surgeon: Marty Heck, MD;  Location: [place] [facility];  Service: Cardiovascular;  Laterality: N/A;  ? APPENDECTOMY    ? AV FISTULA PLACEMENT Left 10/19/2020  ? Procedure: LEFT BRACHIOCEPHALIC ARTERIOVENOUS (AV) FISTULA CREATION;  Surgeon: Elam Dutch, MD;  Location: [facility] OR;  Service: Vascular;  Laterality: Left;  ? CAROTID ENDARTERECTOMY Right 06/13/2005  ? CORONARY ARTERY BYPASS GRAFT  06/13/2005  ? CABG: LIMA-LAD, SVG-OM, SVG-RCA-PDA 06/13/05  ? LIGATION OF COMPETING BRANCHES OF ARTERIOVENOUS FISTULA Left 12/31/2020  ? Procedure: SIDE BRANCH LIGATION x3;  Surgeon: Marty Heck, MD;  Location: [place];  Service: Vascular;  Laterality: Left;  ? open heart surgery    ? REVISON OF ARTERIOVENOUS FISTULA Left 12/31/2020  ? Procedure: LEFT ARM ARTERIOVENOUS FISTULA REVISION;  Surgeon: Marty Heck, MD;  Location: [place];  Service: Vascular;  Laterality: Left;  ?  ?No current facility-administered medications for this encounter.  ? ?Current Outpatient Medications  ?Medication Sig Dispense Refill Last Dose  ? acetaminophen (TYLENOL) 500 MG tablet Take 1,000 mg by mouth every 8 (eight) hours  as needed for moderate pain.     ? albuterol (PROVENTIL) (2.5 MG/3ML) 0.083% nebulizer solution Take 2.5 mg by nebulization every 4 (four) hours as needed for wheezing or shortness of breath.     ? albuterol (VENTOLIN HFA) 108 (90 Base) MCG/ACT inhaler Can inhale two puffs every four to six hours as needed for cough or wheeze. 18 g 0   ? aspirin 81 MG EC tablet Take 81 mg by mouth daily. Swallow whole.     ? atorvastatin (LIPITOR) 80 MG tablet Take 80 mg by mouth daily.     ? Budeson-Glycopyrrol-Formoterol (BREZTRI AEROSPHERE) 160-9-4.8 MCG/ACT AERO Inhale 2 puffs into the lungs in the morning and at bedtime. 10.7 g 5   ? clopidogrel (PLAVIX) 75 MG tablet Take 75 mg by mouth daily.     ? COLLAGEN PO Take 1 capsule by mouth in the morning, at noon, and at bedtime.     ? famotidine (PEPCID) 20 MG tablet Take 20 mg by mouth 2 (two) times daily.     ? hydrOXYzine (ATARAX/VISTARIL) 25 MG tablet Take 25 mg by mouth every 6 (six) hours as needed for anxiety.     ? levETIRAcetam (KEPPRA) 500 MG tablet Take 1 tablet twice a day. Take additional 1 tablet after dialysis. 75 tablet 11   ? levothyroxine (SYNTHROID) 200 MCG tablet Take 200 mcg by mouth daily before breakfast.     ? lidocaine-prilocaine (EMLA) cream Apply 1 application topically as needed (port access).     ?  metoprolol tartrate (LOPRESSOR) 25 MG tablet Take 25 mg by mouth 2 (two) times daily.     ? nitroGLYCERIN (NITROSTAT) 0.4 MG SL tablet Place 0.4 mg under the tongue every 5 (five) minutes as needed for chest pain.     ? omeprazole (PRILOSEC) 40 MG capsule TAKE 1 CAPSULE BY MOUTH IN THE MORNING AS DIRECTED 90 capsule 0   ? ondansetron (ZOFRAN) 4 MG tablet Take 4 mg by mouth 2 (two) times daily.     ? vitamin B-12 (CYANOCOBALAMIN) 500 MCG tablet Take 500 mcg by mouth daily.     ? ?Allergies  ?Allergen Reactions  ? Codeine   ?  GI issues  ?  ?Social History  ? ?Tobacco Use  ? Smoking status: Former  ?  Types: Cigarettes  ?  Quit date: 2019  ?  Years since  quitting: 4.2  ? Smokeless tobacco: Never  ?Substance Use Topics  ? Alcohol use: No  ?  ?Family History  ?Problem Relation Age of Onset  ? Asthma Father   ?  ? ?Review of Systems  ?Constitutional:  Negative for

## 2021-05-31 ENCOUNTER — Observation Stay (HOSPITAL_COMMUNITY): Payer: Medicare HMO

## 2021-05-31 ENCOUNTER — Ambulatory Visit (HOSPITAL_COMMUNITY): Payer: Medicare HMO | Admitting: Physician Assistant

## 2021-05-31 ENCOUNTER — Ambulatory Visit (HOSPITAL_BASED_OUTPATIENT_CLINIC_OR_DEPARTMENT_OTHER): Payer: Medicare HMO | Admitting: Certified Registered Nurse Anesthetist

## 2021-05-31 ENCOUNTER — Ambulatory Visit (HOSPITAL_COMMUNITY): Payer: Medicare HMO

## 2021-05-31 ENCOUNTER — Other Ambulatory Visit: Payer: Self-pay

## 2021-05-31 ENCOUNTER — Encounter (HOSPITAL_COMMUNITY)
Admission: RE | Disposition: A | Discharge status: Home / Self Care | Payer: Self-pay | Source: Ambulatory Visit | Attending: Orthopedic Surgery

## 2021-05-31 ENCOUNTER — Encounter (HOSPITAL_COMMUNITY): Payer: Self-pay | Admitting: Orthopedic Surgery

## 2021-05-31 ENCOUNTER — Observation Stay (HOSPITAL_COMMUNITY)
Admission: RE | Admit: 2021-05-31 | Discharge: 2021-06-01 | Disposition: A | Discharge status: Home / Self Care | Payer: Medicare HMO | Source: Ambulatory Visit | Attending: Orthopedic Surgery | Admitting: Orthopedic Surgery

## 2021-05-31 DIAGNOSIS — Z79899 Other long term (current) drug therapy: Secondary | ICD-10-CM | POA: Insufficient documentation

## 2021-05-31 DIAGNOSIS — Z8616 Personal history of COVID-19: Secondary | ICD-10-CM | POA: Insufficient documentation

## 2021-05-31 DIAGNOSIS — I509 Heart failure, unspecified: Secondary | ICD-10-CM | POA: Diagnosis not present

## 2021-05-31 DIAGNOSIS — E1122 Type 2 diabetes mellitus with diabetic chronic kidney disease: Secondary | ICD-10-CM | POA: Insufficient documentation

## 2021-05-31 DIAGNOSIS — J45909 Unspecified asthma, uncomplicated: Secondary | ICD-10-CM | POA: Diagnosis not present

## 2021-05-31 DIAGNOSIS — Z8673 Personal history of transient ischemic attack (TIA), and cerebral infarction without residual deficits: Secondary | ICD-10-CM | POA: Diagnosis not present

## 2021-05-31 DIAGNOSIS — Z87891 Personal history of nicotine dependence: Secondary | ICD-10-CM | POA: Insufficient documentation

## 2021-05-31 DIAGNOSIS — E039 Hypothyroidism, unspecified: Secondary | ICD-10-CM

## 2021-05-31 DIAGNOSIS — J449 Chronic obstructive pulmonary disease, unspecified: Secondary | ICD-10-CM | POA: Diagnosis not present

## 2021-05-31 DIAGNOSIS — I2510 Atherosclerotic heart disease of native coronary artery without angina pectoris: Secondary | ICD-10-CM | POA: Diagnosis not present

## 2021-05-31 DIAGNOSIS — Z7982 Long term (current) use of aspirin: Secondary | ICD-10-CM | POA: Diagnosis not present

## 2021-05-31 DIAGNOSIS — I350 Nonrheumatic aortic (valve) stenosis: Secondary | ICD-10-CM | POA: Diagnosis not present

## 2021-05-31 DIAGNOSIS — Z96641 Presence of right artificial hip joint: Secondary | ICD-10-CM

## 2021-05-31 DIAGNOSIS — Z951 Presence of aortocoronary bypass graft: Secondary | ICD-10-CM | POA: Diagnosis not present

## 2021-05-31 DIAGNOSIS — I1311 Hypertensive heart and chronic kidney disease without heart failure, with stage 5 chronic kidney disease, or end stage renal disease: Secondary | ICD-10-CM | POA: Insufficient documentation

## 2021-05-31 DIAGNOSIS — M1611 Unilateral primary osteoarthritis, right hip: Principal | ICD-10-CM | POA: Insufficient documentation

## 2021-05-31 DIAGNOSIS — Z992 Dependence on renal dialysis: Secondary | ICD-10-CM | POA: Insufficient documentation

## 2021-05-31 DIAGNOSIS — Z01818 Encounter for other preprocedural examination: Secondary | ICD-10-CM

## 2021-05-31 DIAGNOSIS — N186 End stage renal disease: Secondary | ICD-10-CM | POA: Insufficient documentation

## 2021-05-31 HISTORY — PX: TOTAL HIP ARTHROPLASTY: SHX124

## 2021-05-31 LAB — COMPREHENSIVE METABOLIC PANEL
ALT: 13 U/L (ref 0–44)
AST: 19 U/L (ref 15–41)
Albumin: 4 g/dL (ref 3.5–5.0)
Alkaline Phosphatase: 144 U/L — ABNORMAL HIGH (ref 38–126)
Anion gap: 10 (ref 5–15)
BUN: 38 mg/dL — ABNORMAL HIGH (ref 8–23)
CO2: 27 mmol/L (ref 22–32)
Calcium: 9.3 mg/dL (ref 8.9–10.3)
Chloride: 98 mmol/L (ref 98–111)
Creatinine, Ser: 3.33 mg/dL — ABNORMAL HIGH (ref 0.44–1.00)
GFR, Estimated: 15 mL/min — ABNORMAL LOW (ref >60)
Glucose, Bld: 82 mg/dL (ref 70–99)
Potassium: 3.7 mmol/L (ref 3.5–5.1)
Sodium: 135 mmol/L (ref 135–145)
Total Bilirubin: 0.8 mg/dL (ref 0.3–1.2)
Total Protein: 7.4 g/dL (ref 6.5–8.1)

## 2021-05-31 LAB — CBC
HCT: 29.5 % — ABNORMAL LOW (ref 36.0–46.0)
Hemoglobin: 9.9 g/dL — ABNORMAL LOW (ref 12.0–15.0)
MCH: 33.7 pg (ref 26.0–34.0)
MCHC: 33.6 g/dL (ref 30.0–36.0)
MCV: 100.3 fL — ABNORMAL HIGH (ref 80.0–100.0)
Platelets: 281 10*3/uL (ref 150–400)
RBC: 2.94 MIL/uL — ABNORMAL LOW (ref 3.87–5.11)
RDW: 12.9 % (ref 11.5–15.5)
WBC: 8.7 10*3/uL (ref 4.0–10.5)
nRBC: 0 % (ref 0.0–0.2)

## 2021-05-31 LAB — TYPE AND SCREEN
ABO/RH(D): O POS
Antibody Screen: POSITIVE

## 2021-05-31 SURGERY — ARTHROPLASTY, HIP, TOTAL, ANTERIOR APPROACH
Anesthesia: Spinal | Site: Hip | Laterality: Right

## 2021-05-31 MED ORDER — METOCLOPRAMIDE HCL 5 MG/ML IJ SOLN: 5.0000 mg | Freq: Three times a day (TID) | INTRAMUSCULAR | Status: DC | PRN

## 2021-05-31 MED ORDER — FAMOTIDINE 20 MG PO TABS: 20.0000 mg | ORAL_TABLET | Freq: Two times a day (BID) | ORAL | Status: DC

## 2021-05-31 MED ORDER — SODIUM CHLORIDE 0.9 % IV SOLN: INTRAVENOUS | Status: DC

## 2021-05-31 MED ORDER — PHENYLEPHRINE HCL-NACL 20-0.9 MG/250ML-% IV SOLN
INTRAVENOUS | Status: DC | PRN
  Administered 2021-05-31: 35 ug/min via INTRAVENOUS

## 2021-05-31 MED ORDER — METOPROLOL TARTRATE 25 MG PO TABS
25.0000 mg | ORAL_TABLET | Freq: Two times a day (BID) | ORAL | Status: DC
  Administered 2021-05-31 – 2021-06-01 (×2): 25 mg via ORAL
  Filled 2021-05-31 (×3): qty 1

## 2021-05-31 MED ORDER — PROPOFOL 10 MG/ML IV BOLUS
INTRAVENOUS | Status: DC | PRN
  Administered 2021-05-31: 20 mg via INTRAVENOUS

## 2021-05-31 MED ORDER — UMECLIDINIUM BROMIDE 62.5 MCG/ACT IN AEPB
1.0000 | INHALATION_SPRAY | Freq: Every day | RESPIRATORY_TRACT | Status: DC
  Filled 2021-05-31: qty 7

## 2021-05-31 MED ORDER — ALBUTEROL SULFATE HFA 108 (90 BASE) MCG/ACT IN AERS: 2.0000 | INHALATION_SPRAY | RESPIRATORY_TRACT | Status: DC | PRN

## 2021-05-31 MED ORDER — ASPIRIN 81 MG PO CHEW
81.0000 mg | CHEWABLE_TABLET | Freq: Two times a day (BID) | ORAL | Status: DC
  Administered 2021-05-31 – 2021-06-01 (×2): 81 mg via ORAL
  Filled 2021-05-31 (×2): qty 1

## 2021-05-31 MED ORDER — HYDROMORPHONE HCL 1 MG/ML IJ SOLN: 0.5000 mg | INTRAMUSCULAR | Status: DC | PRN

## 2021-05-31 MED ORDER — FAMOTIDINE 20 MG PO TABS: 20.0000 mg | ORAL_TABLET | Freq: Every day | ORAL | Status: DC

## 2021-05-31 MED ORDER — DOCUSATE SODIUM 100 MG PO CAPS
100.0000 mg | ORAL_CAPSULE | Freq: Two times a day (BID) | ORAL | Status: DC
  Administered 2021-05-31 – 2021-06-01 (×2): 100 mg via ORAL
  Filled 2021-05-31 (×3): qty 1

## 2021-05-31 MED ORDER — CEFAZOLIN SODIUM-DEXTROSE 2-4 GM/100ML-% IV SOLN: 2.0000 g | Freq: Four times a day (QID) | INTRAVENOUS | Status: DC

## 2021-05-31 MED ORDER — LEVETIRACETAM 500 MG PO TABS
500.0000 mg | ORAL_TABLET | Freq: Two times a day (BID) | ORAL | Status: DC
  Administered 2021-05-31 – 2021-06-01 (×2): 500 mg via ORAL
  Filled 2021-05-31 (×2): qty 1

## 2021-05-31 MED ORDER — PROPOFOL 1000 MG/100ML IV EMUL
INTRAVENOUS | Status: AC
  Filled 2021-05-31: qty 100

## 2021-05-31 MED ORDER — SODIUM CHLORIDE 0.9 % IV SOLN: INTRAVENOUS | Status: DC | PRN

## 2021-05-31 MED ORDER — METOCLOPRAMIDE HCL 5 MG PO TABS: 5.0000 mg | ORAL_TABLET | Freq: Three times a day (TID) | ORAL | Status: DC | PRN

## 2021-05-31 MED ORDER — DEXAMETHASONE SODIUM PHOSPHATE 10 MG/ML IJ SOLN
INTRAMUSCULAR | Status: AC
  Filled 2021-05-31: qty 1

## 2021-05-31 MED ORDER — DEXAMETHASONE SODIUM PHOSPHATE 10 MG/ML IJ SOLN
10.0000 mg | Freq: Once | INTRAMUSCULAR | Status: AC
  Administered 2021-06-01: 10 mg via INTRAVENOUS
  Filled 2021-05-31: qty 1

## 2021-05-31 MED ORDER — HYDROXYZINE HCL 25 MG PO TABS: 25.0000 mg | ORAL_TABLET | Freq: Four times a day (QID) | ORAL | Status: DC | PRN

## 2021-05-31 MED ORDER — STERILE WATER FOR IRRIGATION IR SOLN
Status: DC | PRN
  Administered 2021-05-31: 20000 mL

## 2021-05-31 MED ORDER — DEXAMETHASONE SODIUM PHOSPHATE 10 MG/ML IJ SOLN
8.0000 mg | Freq: Once | INTRAMUSCULAR | Status: AC
  Administered 2021-05-31: 8 mg via INTRAVENOUS

## 2021-05-31 MED ORDER — LIDOCAINE HCL (PF) 2 % IJ SOLN
INTRAMUSCULAR | Status: AC
  Filled 2021-05-31: qty 5

## 2021-05-31 MED ORDER — ONDANSETRON HCL 4 MG/2ML IJ SOLN: 4.0000 mg | Freq: Four times a day (QID) | INTRAMUSCULAR | Status: DC | PRN

## 2021-05-31 MED ORDER — TRANEXAMIC ACID-NACL 1000-0.7 MG/100ML-% IV SOLN
1000.0000 mg | INTRAVENOUS | Status: AC
  Administered 2021-05-31: 1000 mg via INTRAVENOUS
  Filled 2021-05-31: qty 100

## 2021-05-31 MED ORDER — HYDROMORPHONE HCL 2 MG PO TABS
2.0000 mg | ORAL_TABLET | ORAL | Status: DC | PRN
  Administered 2021-05-31 – 2021-06-01 (×4): 4 mg via ORAL
  Filled 2021-05-31 (×4): qty 2

## 2021-05-31 MED ORDER — ACETAMINOPHEN 10 MG/ML IV SOLN: 1000.0000 mg | Freq: Once | INTRAVENOUS | Status: DC | PRN

## 2021-05-31 MED ORDER — FAMOTIDINE 20 MG PO TABS
10.0000 mg | ORAL_TABLET | Freq: Every day | ORAL | Status: DC
  Administered 2021-06-01: 10 mg via ORAL
  Filled 2021-05-31: qty 1

## 2021-05-31 MED ORDER — ALBUTEROL SULFATE (2.5 MG/3ML) 0.083% IN NEBU: 2.5000 mg | INHALATION_SOLUTION | RESPIRATORY_TRACT | Status: DC | PRN

## 2021-05-31 MED ORDER — ACETAMINOPHEN 325 MG PO TABS: 325.0000 mg | ORAL_TABLET | Freq: Four times a day (QID) | ORAL | Status: DC | PRN

## 2021-05-31 MED ORDER — LEVOTHYROXINE SODIUM 100 MCG PO TABS
200.0000 ug | ORAL_TABLET | Freq: Every day | ORAL | Status: DC
  Administered 2021-06-01: 200 ug via ORAL
  Filled 2021-05-31: qty 2

## 2021-05-31 MED ORDER — PHENYLEPHRINE 40 MCG/ML (10ML) SYRINGE FOR IV PUSH (FOR BLOOD PRESSURE SUPPORT)
PREFILLED_SYRINGE | INTRAVENOUS | Status: AC
  Filled 2021-05-31: qty 10

## 2021-05-31 MED ORDER — ATORVASTATIN CALCIUM 40 MG PO TABS
80.0000 mg | ORAL_TABLET | Freq: Every day | ORAL | Status: DC
  Administered 2021-06-01: 80 mg via ORAL
  Filled 2021-05-31: qty 2

## 2021-05-31 MED ORDER — ONDANSETRON HCL 4 MG/2ML IJ SOLN
INTRAMUSCULAR | Status: AC
  Filled 2021-05-31: qty 2

## 2021-05-31 MED ORDER — ONDANSETRON HCL 4 MG PO TABS: 4.0000 mg | ORAL_TABLET | Freq: Four times a day (QID) | ORAL | Status: DC | PRN

## 2021-05-31 MED ORDER — FLUTICASONE FUROATE-VILANTEROL 200-25 MCG/ACT IN AEPB
1.0000 | INHALATION_SPRAY | Freq: Every day | RESPIRATORY_TRACT | Status: DC
  Filled 2021-05-31: qty 28

## 2021-05-31 MED ORDER — METHOCARBAMOL 500 MG IVPB - SIMPLE MED
500.0000 mg | Freq: Four times a day (QID) | INTRAVENOUS | Status: DC | PRN
  Filled 2021-05-31: qty 50

## 2021-05-31 MED ORDER — LACTATED RINGERS IV SOLN: INTRAVENOUS | Status: DC

## 2021-05-31 MED ORDER — ORAL CARE MOUTH RINSE: 15.0000 mL | Freq: Once | OROMUCOSAL | Status: AC

## 2021-05-31 MED ORDER — POLYETHYLENE GLYCOL 3350 17 G PO PACK: 17.0000 g | PACK | Freq: Every day | ORAL | Status: DC | PRN

## 2021-05-31 MED ORDER — CEFAZOLIN SODIUM-DEXTROSE 2-4 GM/100ML-% IV SOLN
2.0000 g | INTRAVENOUS | Status: AC
  Administered 2021-05-31: 2 g via INTRAVENOUS
  Filled 2021-05-31: qty 100

## 2021-05-31 MED ORDER — SODIUM CHLORIDE 0.9 % IR SOLN
Status: DC | PRN
  Administered 2021-05-31: 1000 mL

## 2021-05-31 MED ORDER — TRANEXAMIC ACID-NACL 1000-0.7 MG/100ML-% IV SOLN
1000.0000 mg | Freq: Once | INTRAVENOUS | Status: AC
  Administered 2021-05-31: 1000 mg via INTRAVENOUS
  Filled 2021-05-31: qty 100

## 2021-05-31 MED ORDER — BUDESON-GLYCOPYRROL-FORMOTEROL 160-9-4.8 MCG/ACT IN AERO: 2.0000 | INHALATION_SPRAY | Freq: Two times a day (BID) | RESPIRATORY_TRACT | Status: DC

## 2021-05-31 MED ORDER — MENTHOL 3 MG MT LOZG: 1.0000 | LOZENGE | OROMUCOSAL | Status: DC | PRN

## 2021-05-31 MED ORDER — METHOCARBAMOL 500 MG PO TABS
500.0000 mg | ORAL_TABLET | Freq: Four times a day (QID) | ORAL | Status: DC | PRN
  Administered 2021-05-31 – 2021-06-01 (×3): 500 mg via ORAL
  Filled 2021-05-31 (×3): qty 1

## 2021-05-31 MED ORDER — BISACODYL 10 MG RE SUPP: 10.0000 mg | Freq: Every day | RECTAL | Status: DC | PRN

## 2021-05-31 MED ORDER — POVIDONE-IODINE 10 % EX SWAB
2.0000 "application " | Freq: Once | CUTANEOUS | Status: AC
  Administered 2021-05-31: 2 via TOPICAL

## 2021-05-31 MED ORDER — HYDROMORPHONE HCL 1 MG/ML IJ SOLN: 0.2500 mg | INTRAMUSCULAR | Status: DC | PRN

## 2021-05-31 MED ORDER — LACTATED RINGERS IV SOLN
INTRAVENOUS | Status: DC
Start: 2021-05-31 — End: 2021-05-31

## 2021-05-31 MED ORDER — CHLORHEXIDINE GLUCONATE 0.12 % MT SOLN
15.0000 mL | Freq: Once | OROMUCOSAL | Status: AC
  Administered 2021-05-31: 15 mL via OROMUCOSAL

## 2021-05-31 MED ORDER — ONDANSETRON HCL 4 MG/2ML IJ SOLN
INTRAMUSCULAR | Status: DC | PRN
  Administered 2021-05-31: 4 mg via INTRAVENOUS

## 2021-05-31 MED ORDER — FERROUS SULFATE 325 (65 FE) MG PO TABS
325.0000 mg | ORAL_TABLET | Freq: Three times a day (TID) | ORAL | Status: DC
  Administered 2021-05-31 – 2021-06-01 (×3): 325 mg via ORAL
  Filled 2021-05-31 (×3): qty 1

## 2021-05-31 MED ORDER — PANTOPRAZOLE SODIUM 40 MG PO TBEC
40.0000 mg | DELAYED_RELEASE_TABLET | Freq: Every day | ORAL | Status: DC
  Administered 2021-06-01: 40 mg via ORAL
  Filled 2021-05-31: qty 1

## 2021-05-31 MED ORDER — PROPOFOL 500 MG/50ML IV EMUL
INTRAVENOUS | Status: DC | PRN
  Administered 2021-05-31: 50 ug/kg/min via INTRAVENOUS

## 2021-05-31 MED ORDER — DIPHENHYDRAMINE HCL 12.5 MG/5ML PO ELIX: 12.5000 mg | ORAL_SOLUTION | ORAL | Status: DC | PRN

## 2021-05-31 MED ORDER — CLOPIDOGREL BISULFATE 75 MG PO TABS
75.0000 mg | ORAL_TABLET | Freq: Every day | ORAL | Status: DC
  Administered 2021-06-01: 75 mg via ORAL
  Filled 2021-05-31: qty 1

## 2021-05-31 MED ORDER — LIDOCAINE HCL (PF) 2 % IJ SOLN
INTRAMUSCULAR | Status: DC | PRN
  Administered 2021-05-31: 20 mg via INTRADERMAL

## 2021-05-31 MED ORDER — ONDANSETRON HCL 4 MG/2ML IJ SOLN: 4.0000 mg | Freq: Once | INTRAMUSCULAR | Status: DC | PRN

## 2021-05-31 MED ORDER — PHENOL 1.4 % MT LIQD: 1.0000 | OROMUCOSAL | Status: DC | PRN

## 2021-05-31 MED ORDER — CEFAZOLIN SODIUM-DEXTROSE 2-4 GM/100ML-% IV SOLN
2.0000 g | Freq: Three times a day (TID) | INTRAVENOUS | Status: AC
  Administered 2021-05-31: 2 g via INTRAVENOUS
  Filled 2021-05-31: qty 100

## 2021-05-31 MED ORDER — BUPIVACAINE IN DEXTROSE 0.75-8.25 % IT SOLN
INTRATHECAL | Status: DC | PRN
  Administered 2021-05-31: 1.4 mL via INTRATHECAL

## 2021-05-31 SURGICAL SUPPLY — 43 items
ADH SKN CLS APL DERMABOND .7 (GAUZE/BANDAGES/DRESSINGS) ×1
BAG COUNTER SPONGE SURGICOUNT (BAG) IMPLANT
BAG DECANTER FOR FLEXI CONT (MISCELLANEOUS) IMPLANT
BAG SPEC THK2 15X12 ZIP CLS (MISCELLANEOUS)
BAG SPNG CNTER NS LX DISP (BAG)
BAG ZIPLOCK 12X15 (MISCELLANEOUS) IMPLANT
BLADE SAG 18X100X1.27 (BLADE) ×3 IMPLANT
COVER PERINEAL POST (MISCELLANEOUS) ×3 IMPLANT
COVER SURGICAL LIGHT HANDLE (MISCELLANEOUS) ×3 IMPLANT
CUP ACETBLR 52 OD PINNACLE (Hips) ×1 IMPLANT
DERMABOND ADVANCED (GAUZE/BANDAGES/DRESSINGS) ×1
DERMABOND ADVANCED .7 DNX12 (GAUZE/BANDAGES/DRESSINGS) ×2 IMPLANT
DRAPE FOOT SWITCH (DRAPES) ×3 IMPLANT
DRAPE STERI IOBAN 125X83 (DRAPES) ×3 IMPLANT
DRAPE U-SHAPE 47X51 STRL (DRAPES) ×6 IMPLANT
DRESSING AQUACEL AG SP 3.5X10 (GAUZE/BANDAGES/DRESSINGS) ×2 IMPLANT
DRSG AQUACEL AG SP 3.5X10 (GAUZE/BANDAGES/DRESSINGS) ×2
DURAPREP 26ML APPLICATOR (WOUND CARE) ×3 IMPLANT
ELECT REM PT RETURN 15FT ADLT (MISCELLANEOUS) ×3 IMPLANT
ELIMINATOR HOLE APEX DEPUY (Hips) ×1 IMPLANT
GLOVE SURG ENC MOIS LTX SZ6 (GLOVE) ×3 IMPLANT
GLOVE SURG ENC MOIS LTX SZ7 (GLOVE) ×3 IMPLANT
GLOVE SURG UNDER LTX SZ6.5 (GLOVE) ×6 IMPLANT
GLOVE SURG UNDER POLY LF SZ7.5 (GLOVE) ×3 IMPLANT
GOWN STRL REUS W/ TWL LRG LVL3 (GOWN DISPOSABLE) ×4 IMPLANT
GOWN STRL REUS W/TWL LRG LVL3 (GOWN DISPOSABLE) ×4
HEAD CERAMIC 36 PLUS5 (Hips) ×1 IMPLANT
HOLDER FOLEY CATH W/STRAP (MISCELLANEOUS) ×3 IMPLANT
KIT TURNOVER KIT A (KITS) IMPLANT
LINER NEUTRAL 52X36MM PLUS 4 (Liner) ×1 IMPLANT
PACK ANTERIOR HIP CUSTOM (KITS) ×3 IMPLANT
SCREW 6.5MMX40MM (Screw) ×1 IMPLANT
SPONGE T-LAP 18X18 ~~LOC~~+RFID (SPONGE) ×9 IMPLANT
STEM FEM ACTIS STD SZ4 (Stem) ×1 IMPLANT
SUT MNCRL AB 4-0 PS2 18 (SUTURE) ×3 IMPLANT
SUT STRATAFIX 0 PDS 27 VIOLET (SUTURE) ×2
SUT VIC AB 1 CT1 36 (SUTURE) ×9 IMPLANT
SUT VIC AB 2-0 CT1 27 (SUTURE) ×4
SUT VIC AB 2-0 CT1 TAPERPNT 27 (SUTURE) ×4 IMPLANT
SUTURE STRATFX 0 PDS 27 VIOLET (SUTURE) ×2 IMPLANT
TRAY FOLEY MTR SLVR 16FR STAT (SET/KITS/TRAYS/PACK) IMPLANT
TUBE SUCTION HIGH CAP CLEAR NV (SUCTIONS) ×3 IMPLANT
WATER STERILE IRR 1000ML POUR (IV SOLUTION) ×3 IMPLANT

## 2021-05-31 NOTE — Anesthesia Postprocedure Evaluation (Signed)
Anesthesia Post Note ? ?Patient: Bethany Sosa ? ?Procedure(s) Performed: TOTAL HIP ARTHROPLASTY ANTERIOR APPROACH (Right: Hip) ? ?  ? ?Patient location during evaluation: PACU ?Anesthesia Type: Spinal ?Level of consciousness: oriented and awake and alert ?Pain management: pain level controlled ?Vital Signs Assessment: post-procedure vital signs reviewed and stable ?Respiratory status: spontaneous breathing, respiratory function stable and patient connected to nasal cannula oxygen ?Cardiovascular status: blood pressure returned to baseline and stable ?Postop Assessment: no headache, no backache and no apparent nausea or vomiting ?Anesthetic complications: no ? ? ?No notable events documented. ? ?Last Vitals:  ?Vitals:  ? 05/31/21 1400 05/31/21 1415  ?BP: (!) 168/61 (!) 172/63  ?Pulse: 65 67  ?Resp: 19 17  ?Temp:    ?SpO2: 100% 100%  ?  ?Last Pain:  ?Vitals:  ? 05/31/21 1345  ?TempSrc:   ?PainSc: 0-No pain  ? ? ?  ?  ?  ?  ?  ?  ? ?Adrinne Sze S ? ? ? ? ?

## 2021-05-31 NOTE — Discharge Instructions (Signed)

## 2021-05-31 NOTE — Transfer of Care (Signed)
Immediate Anesthesia Transfer of Care Note ? ?Patient: Bethany Sosa ? ?Procedure(s) Performed: TOTAL HIP ARTHROPLASTY ANTERIOR APPROACH (Right: Hip) ? ?Patient Location: PACU ? ?Anesthesia Type:Spinal ? ?Level of Consciousness: awake, alert  and oriented ? ?Airway & Oxygen Therapy: Patient Spontanous Breathing and Patient connected to face mask oxygen ? ?Post-op Assessment: Report given to RN and Post -op Vital signs reviewed and stable ? ?Post vital signs: Reviewed and stable ? ?Last Vitals:  ?Vitals Value Taken Time  ?BP 104/50 05/31/21 1249  ?Temp    ?Pulse 70 05/31/21 1252  ?Resp 15 05/31/21 1252  ?SpO2 100 % 05/31/21 1252  ?Vitals shown include unvalidated device data. ? ?Last Pain:  ?Vitals:  ? 05/31/21 0941  ?TempSrc:   ?PainSc: 8   ?   ? ?  ? ?Complications: No notable events documented. ?

## 2021-05-31 NOTE — Anesthesia Procedure Notes (Signed)
Procedure Name: [place] ?Date/Time: 05/31/2021 11:09 AM ?Performed by: Maxwell Caul, CRNA ?Pre-anesthesia Checklist: Patient identified, Emergency Drugs available, Suction available and Patient being monitored ?Patient Re-evaluated:Patient Re-evaluated prior to induction ?Oxygen Delivery Method: Simple face mask ? ? ? ? ?
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
## 2021-05-31 NOTE — Anesthesia Procedure Notes (Signed)
Spinal ? ?Patient location during procedure: OR ?End time: 05/31/2021 11:15 AM ?Reason for block: surgical anesthesia ?Staffing ?Performed: resident/CRNA  ?Anesthesiologist: Myrtie Soman, MD ?Resident/CRNA: Maxwell Caul, CRNA ?Preanesthetic Checklist ?Completed: patient identified, IV checked, site marked, risks and benefits discussed, surgical consent, monitors and equipment checked, pre-op evaluation and timeout performed ?Spinal Block ?Patient position: sitting ?Prep: DuraPrep ?Patient monitoring: heart rate, cardiac monitor, continuous pulse ox and blood pressure ?Approach: midline ?Location: L3-4 ?Injection technique: single-shot ?Needle ?Needle type: Pencan  ?Needle gauge: 24 G ?Needle length: 10 cm ?Assessment ?Sensory level: T4 ?Events: CSF return ?Additional Notes ?IV functioning, monitors applied to pt. Expiration date of kit checked and confirmed to be in date. Sterile prep and drape, hand hygiene and sterile gloves used. Pt was positioned and spine was prepped in sterile fashion. Skin was anesthetized with lidocaine. Free flow of clear CSF obtained prior to injecting local anesthetic into CSF x 1 attempt. Spinal needle aspirated freely following injection. Needle was carefully withdrawn, and pt tolerated procedure well. Loss of motor and sensory on exam post injection. Dr Kalman Shan at bedside for entire placement. ? ? ? ?

## 2021-05-31 NOTE — Interval H&P Note (Signed)
History and Physical Interval Note: ? ?05/31/2021 ?10:00 AM ? ?Bethany Sosa  has presented today for surgery, with the diagnosis of Right hip osteoarthitis.  The various methods of treatment have been discussed with the patient and family. After consideration of risks, benefits and other options for treatment, the patient has consented to  Procedure(s): ?TOTAL HIP ARTHROPLASTY ANTERIOR APPROACH (Right) as a surgical intervention.  The patient's history has been reviewed, patient examined, no change in status, stable for surgery.  I have reviewed the patient's chart and labs.  Questions were answered to the patient's satisfaction.   ? ? ?Mauri Pole ? ? ?

## 2021-05-31 NOTE — Op Note (Signed)
? NAME:  Bethany Sosa NO.: [id number]  ?   ? MEDICAL RECORD NO.: [id number]  ?   ? FACILITY:  [facility]  ?   ? PHYSICIAN:  Mauri Pole  DATE OF BIRTH:  1955/04/04 ?   ? DATE OF PROCEDURE:  05/31/2021 ?   ?                             OPERATIVE REPORT  ?   ?   ? PREOPERATIVE DIAGNOSIS: Right  hip osteoarthritis.  ?   ? POSTOPERATIVE DIAGNOSIS:  Right hip osteoarthritis.  ?   ? PROCEDURE:  Right total hip replacement through an anterior approach  ? utilizing DePuy THR system, component size 52 mm pinnacle cup, a size 36+4 neutral  ? Altrex liner, a size 4 standard Actis stem with a 36+5 delta ceramic  ? ball.  ?   ? SURGEON:  Pietro Cassis. Alvan Dame, M.D.  ?   ? ASSISTANT:  Costella Hatcher, PA-C ?   ? ANESTHESIA:  Spinal.  ?   ? SPECIMENS:  None.  ?   ? COMPLICATIONS:  None.  ?   ? BLOOD LOSS:  250 cc ?   ? DRAINS:  None.  ?   ? INDICATION OF THE PROCEDURE:  Bethany Sosa is a 66 y.o. female who had  ? presented to office for evaluation of right hip pain.  Radiographs revealed  ? progressive degenerative changes with bone-on-bone  ? articulation of the  hip joint, including subchondral cystic changes and osteophytes.  The patient had painful limited range of  ? motion significantly affecting their overall quality of life and function.  The patient was failing to   ? respond to conservative measures including medications and/or injections and activity modification and at this point was ready  ? to proceed with more definitive measures.  Consent was obtained for  ? benefit of pain relief.  Specific risks of infection, DVT, component  ? failure, dislocation, neurovascular injury, and need for revision surgery were reviewed in the office as well discussion of  ? the anterior versus posterior approach were reviewed. ?   ? PROCEDURE IN DETAIL:  The patient was brought to operative theater.  ? Once adequate anesthesia, preoperative antibiotics, 2 gm of Ancef, 1 gm of Tranexamic Acid,  and 10 mg of Decadron were administered, the patient was positioned supine on the [employer] table.  Once the patient was safely positioned with adequate padding of boney prominences we predraped out the hip, and used fluoroscopy to confirm orientation of the pelvis.  ?   ? The right hip was then prepped and draped from proximal iliac crest to  ? mid thigh with a shower curtain technique.  ?   ? Time-out was performed identifying the patient, planned procedure, and the appropriate extremity.   ? ? An incision was then made 2 cm lateral to the  ? anterior superior iliac spine extending over the orientation of the  ? tensor fascia lata muscle and sharp dissection was carried down to the  ? fascia of the muscle.  ?   ? The fascia was then incised.  The muscle belly was identified and swept  ? laterally and retractor placed along the superior neck.  Following  ? cauterization of the circumflex vessels and removing some  pericapsular  ? fat, a second cobra retractor was placed on the inferior neck.  A T-capsulotomy was made along the line of the  ? superior neck to the trochanteric fossa, then extended proximally and  ? distally.  Tag sutures were placed and the retractors were then placed  ? intracapsular.  We then identified the trochanteric fossa and  ? orientation of my neck cut and then made a neck osteotomy with the femur on traction.  The femoral  ? head was removed without difficulty or complication.  Traction was let  ? off and retractors were placed posterior and anterior around the  ? acetabulum.  ?   ? The labrum and foveal tissue were debrided.  I began reaming with a 47 mm  ? reamer and reamed up to 51 mm reamer with good bony bed preparation and a 52 mm ? cup was chosen.  The final 52 mm Pinnacle cup was then impacted under fluoroscopy to confirm the depth of penetration and orientation with respect to  ? Abduction and forward flexion.  A screw was placed into the ilium followed by the hole eliminator.  The  final  ? 36+4 neutral Altrex liner was impacted with good visualized rim fit.  The cup was positioned anatomically within the acetabular portion of the pelvis.  ?   ? At this point, the femur was rolled to 100 degrees.  Further capsule was  ? released off the inferior aspect of the femoral neck.  I then  ? released the superior capsule proximally.  With the leg in a neutral position the hook was placed laterally  ? along the femur under the vastus lateralis origin and elevated manually and then held in position using the hook attachment on the bed.  The leg was then extended and adducted with the leg rolled to 100  ? degrees of external rotation.  Retractors were placed along the medial calcar and posteriorly over the greater trochanter.  Once the proximal femur was fully  ? exposed, I used a box osteotome to set orientation.  I then began  ? broaching with the starting chili pepper broach and passed this by hand and then broached up to 4.  With the 4 broach in place I chose a standard neck and did several trial reductions.  The offset was appropriate, leg lengths  ? appeared to be equal best matched with the +5 head ball trial confirmed radiographically.  ? Given these findings, I went ahead and dislocated the hip, repositioned all  ? retractors and positioned the right hip in the extended and abducted position.  ?The final 4 standard Actis stem was  ? chosen and it was impacted down to the level of neck cut.  Based on this  ? and the trial reductions, a final 36+5 delta ceramic ball was chosen and  ? impacted onto a clean and dry trunnion, and the hip was reduced.  The  ? hip had been irrigated throughout the case again at this point.  I did  ? reapproximate the superior capsular leaflet to the anterior leaflet  ? using #1 Vicryl.  The fascia of the  ? tensor fascia lata muscle was then reapproximated using #1 Vicryl and #0 Stratafix sutures.  The  ? remaining wound was closed with 2-0 Vicryl and running 4-0  Monocryl.  ? The hip was cleaned, dried, and dressed sterilely using Dermabond and  ? Aquacel dressing.  The patient was then brought  ? to recovery  room in stable condition tolerating the procedure well.  ? ? Costella Hatcher, PA-C was present for the entirety of the case involved from  ? preoperative positioning, perioperative retractor management, general  ? facilitation of the case, as well as primary wound closure as assistant.  ?   ?   ?   ? Pietro Cassis Alvan Dame, M.D.  ?   ?   ?05/31/2021 12:22 PM  ?   ?   ?

## 2021-06-01 ENCOUNTER — Encounter (HOSPITAL_COMMUNITY): Payer: Self-pay | Admitting: Orthopedic Surgery

## 2021-06-01 DIAGNOSIS — M1611 Unilateral primary osteoarthritis, right hip: Secondary | ICD-10-CM | POA: Diagnosis not present

## 2021-06-01 LAB — BASIC METABOLIC PANEL
Anion gap: 12 (ref 5–15)
BUN: 47 mg/dL — ABNORMAL HIGH (ref 8–23)
CO2: 21 mmol/L — ABNORMAL LOW (ref 22–32)
Calcium: 8.8 mg/dL — ABNORMAL LOW (ref 8.9–10.3)
Chloride: 101 mmol/L (ref 98–111)
Creatinine, Ser: 3.77 mg/dL — ABNORMAL HIGH (ref 0.44–1.00)
GFR, Estimated: 13 mL/min — ABNORMAL LOW (ref >60)
Glucose, Bld: 120 mg/dL — ABNORMAL HIGH (ref 70–99)
Potassium: 3.9 mmol/L (ref 3.5–5.1)
Sodium: 134 mmol/L — ABNORMAL LOW (ref 135–145)

## 2021-06-01 LAB — CBC
HCT: 25.6 % — ABNORMAL LOW (ref 36.0–46.0)
Hemoglobin: 8.7 g/dL — ABNORMAL LOW (ref 12.0–15.0)
MCH: 34 pg (ref 26.0–34.0)
MCHC: 34 g/dL (ref 30.0–36.0)
MCV: 100 fL (ref 80.0–100.0)
Platelets: 221 10*3/uL (ref 150–400)
RBC: 2.56 MIL/uL — ABNORMAL LOW (ref 3.87–5.11)
RDW: 12.7 % (ref 11.5–15.5)
WBC: 12.1 10*3/uL — ABNORMAL HIGH (ref 4.0–10.5)
nRBC: 0 % (ref 0.0–0.2)

## 2021-06-01 MED ORDER — CEFADROXIL 500 MG PO CAPS: 500.0000 mg | ORAL_CAPSULE | Freq: Every day | ORAL | 0 refills | Status: AC

## 2021-06-01 MED ORDER — HYDROMORPHONE HCL 2 MG PO TABS: 2.0000 mg | ORAL_TABLET | Freq: Four times a day (QID) | ORAL | 0 refills | Status: DC | PRN

## 2021-06-01 MED ORDER — METHOCARBAMOL 500 MG PO TABS: 500.0000 mg | ORAL_TABLET | Freq: Three times a day (TID) | ORAL | 0 refills | Status: DC | PRN

## 2021-06-01 MED ORDER — POLYETHYLENE GLYCOL 3350 17 G PO PACK: 17.0000 g | PACK | Freq: Every day | ORAL | 0 refills | Status: DC | PRN

## 2021-06-01 MED ORDER — DOCUSATE SODIUM 100 MG PO CAPS: 100.0000 mg | ORAL_CAPSULE | Freq: Two times a day (BID) | ORAL | 0 refills | Status: DC

## 2021-06-01 NOTE — TOC Transition Note (Signed)
Transition of Care (TOC) - CM/SW Discharge Note ? ? ?Patient Details  ?Name: Bethany Sosa ?MRN: [id number] ?Date of Birth: Jan 23, 1956 ? ?Transition of Care (TOC) CM/SW Contact:  ?Demani Weyrauch, LCSW ?Phone Number: ?06/01/2021, 10:14 AM ? ? ?Clinical Narrative:    ?Met with pt and confirming she has all needed DME at home. Plan HEP.  No TOC needs. ? ? ?Final next level of care: Home/Self Care ?Barriers to Discharge: No Barriers Identified ? ? ?Patient Goals and CMS Choice ?Patient states their goals for this hospitalization and ongoing recovery are:: return home ?  ?  ? ?Discharge Placement ?  ?           ?  ?  ?  ?  ? ?Discharge Plan and Services ?  ?  ?           ?DME Arranged: N/A ?DME Agency: NA ?  ?  ?  ?  ?  ?  ?  ?  ? ?Social Determinants of Health (SDOH) Interventions ?  ? ? ?Readmission Risk Interventions ?   ? View : No data to display.  ?  ?  ?  ? ? ? ? ? ?

## 2021-06-01 NOTE — Progress Notes (Signed)
Physical Therapy Treatment ?Patient Details ?Name: Bethany Sosa ?MRN: [id number] ?DOB: May 09, 1955 ?Today's Date: 06/01/2021 ? ? ?History of Present Illness Patient is 66 y.o. female s/p Rt THA anterior approach on 05/31/21 with PMH significant for OA, anxiety, asthma, COPD, CAD s/p CABG in 2007, ESRD, GERD, hypothyroidism, NSTEMI in 2022, CVA in 2018. ? ?  ?PT Comments  ? ? Patient making good progress with mobility and had significant reduced pain this date. Pt required min guard/supervision for bed mobility and transfers with RW and min cues for safe walker management during gait. Pt ambulated ~80' with min guard from daughter and supervision from therapist; no over LOB noted throughout. Pt completed stair negotiation with safe sequencing and daughter provided safe guarding as assist with walker. EOS pt educated on HEP for ROM, strengthening, and circulation. She is mobilizing at safe level for discharge home with assist from family. Will continue to progress during acute stay. ? ?  ?Recommendations for follow up therapy are one component of a multi-disciplinary discharge planning process, led by the attending physician.  Recommendations may be updated based on patient status, additional functional criteria and insurance authorization. ? ?Follow Up Recommendations ? Follow physician's recommendations for discharge plan and follow up therapies ?  ?  ?Assistance Recommended at Discharge Intermittent Supervision/Assistance  ?Patient can return home with the following A little help with walking and/or transfers;A little help with bathing/dressing/bathroom;Assistance with cooking/housework;Assist for transportation;Help with stairs or ramp for entrance ?  ?Equipment Recommendations ? None recommended by PT  ?  ?Recommendations for Other Services   ? ? ?  ?Precautions / Restrictions Precautions ?Precautions: Fall ?Restrictions ?Weight Bearing Restrictions: No ?Other Position/Activity Restrictions: WBAT  ?  ? ?Mobility ?  Bed Mobility ?Overal bed mobility: Needs Assistance ?Bed Mobility: Supine to Sit, Sit to Supine ?  ?  ?Supine to sit: HOB elevated, Min guard, Supervision ?  ?  ?General bed mobility comments: guard/supervision, HOB elevated slightly, no assist needed ?  ? ?Transfers ?Overall transfer level: Needs assistance ?Equipment used: Rolling walker (2 wheels) ?Transfers: Sit to/from Stand ?Sit to Stand: Min guard, Supervision ?  ?  ?  ?  ?  ?General transfer comment: guard/supervision for power up from EOB. no assist needed to rise or steady. pt demonstrated safe reach back to sit in recliner. ?  ? ?Ambulation/Gait ?Ambulation/Gait assistance: Min guard, Supervision ?Gait Distance (Feet): 80 Feet ?Assistive device: Rolling walker (2 wheels) ?Gait Pattern/deviations: Decreased stride length, Step-through pattern ?Gait velocity: decr ?  ?  ?General Gait Details: pt with much improved tolerance. min VCs at start for proixmity to RW and pt maintained safe walker management throughout with step to pattern. No LOB noted. pt's daughter present and provided safe guarding/assist with supervision from therapist. ? ? ?Stairs ?Stairs: Yes ?Stairs assistance: Min guard, Supervision ?Stair Management: Two rails, Step to pattern, Forwards ?Number of Stairs: 3 ?General stair comments: cues for sequencing "up with good, down with bad" and for assist from family. Pt's daughter provided safe guarding with cues and supervision from therapist. no overt LOB noted. ? ? ?Wheelchair Mobility ?  ? ?Modified Rankin (Stroke Patients Only) ?  ? ? ?  ?Balance Overall balance assessment: Needs assistance ?Sitting-balance support: Feet supported ?Sitting balance-Leahy Scale: Good ?  ?  ?Standing balance support: Reliant on assistive device for balance, During functional activity, Bilateral upper extremity supported ?Standing balance-Leahy Scale: Poor ?  ?  ?  ?  ?  ?  ?  ?  ?  ?  ?  ?  ?  ? ?  ?  Cognition Arousal/Alertness: Awake/alert ?Behavior During  Therapy: [facility] for tasks assessed/performed ?Overall Cognitive Status: Within Functional Limits for tasks assessed ?  ?  ?  ?  ?  ?  ?  ?  ?  ?  ?  ?  ?  ?  ?  ?  ?  ?  ?  ? ?  ?Exercises Total Joint Exercises ?Ankle Circles/Pumps: AROM, Both, 10 reps, Seated ?Quad Sets: AROM, Right, 5 reps, Seated ?Short Arc Quad: AROM, Right, 5 reps, Seated ?Heel Slides: AROM, Right, 5 reps, Seated ?Hip ABduction/ADduction: AROM, Right, 5 reps, Seated ? ?  ?General Comments   ?  ?  ? ?Pertinent Vitals/Pain Pain Assessment ?Pain Assessment: 0-10 ?Pain Score: 0-No pain ?Pain Location: Rt hip and body ?Pain Descriptors / Indicators: Aching, Discomfort ?Pain Intervention(s): Monitored during session, Limited activity within patient's tolerance  ? ? ?Home Living   ?  ?  ?  ?  ?  ?  ?  ?  ?  ?   ?  ?Prior Function    ?  ?  ?   ? ?PT Goals (current goals can now be found in the care plan section) Acute Rehab PT Goals ?Patient Stated Goal: stop hurting ?PT Goal Formulation: With patient ?Time For Goal Achievement: 06/07/21 ?Potential to Achieve Goals: Good ?Progress towards PT goals: Progressing toward goals ? ?  ?Frequency ? ? ? 7X/week ? ? ? ?  ?PT Plan Current plan remains appropriate  ? ? ?Co-evaluation   ?  ?  ?  ?  ? ?  ?AM-PAC PT "6 Clicks" Mobility   ?Outcome Measure ? Help needed turning from your back to your side while in a flat bed without using bedrails?: A Little ?Help needed moving from lying on your back to sitting on the side of a flat bed without using bedrails?: A Little ?Help needed moving to and from a bed to a chair (including a wheelchair)?: A Little ?Help needed standing up from a chair using your arms (e.g., wheelchair or bedside chair)?: A Little ?Help needed to walk in hospital room?: A Little ?Help needed climbing 3-5 steps with a railing? : A Little ?6 Click Score: 18 ? ?  ?End of Session Equipment Utilized During Treatment: Gait belt ?Activity Tolerance: Patient tolerated treatment well ?Patient left: in  bed;with call bell/phone within reach;with bed alarm set;with SCD's reapplied ?Nurse Communication: Mobility status ?PT Visit Diagnosis: Muscle weakness (generalized) (M62.81);Difficulty in walking, not elsewhere classified (R26.2);Pain ?Pain - Right/Left: Right ?Pain - part of body: Hip (body aches generally) ?  ? ? ?Time: 8756-4332 ?PT Time Calculation (min) (ACUTE ONLY): 23 min ? ?Charges:  $Gait Training: 8-22 mins ?$Therapeutic Exercise: 8-22 mins          ?          ? ?Gwynneth Albright PT, DPT ?Acute Rehabilitation Services ?Office [phone] ?Pager [phone]  ? ? ?Jacques Navy ?06/01/2021, 12:29 PM ? ?

## 2021-06-01 NOTE — Progress Notes (Signed)
? ?  Subjective: ?1 Day Post-Op Procedure(s) (LRB): ?TOTAL HIP ARTHROPLASTY ANTERIOR APPROACH (Right) ?Patient reports pain as mild.   ?Patient seen in rounds with Dr. Alvan Dame. ?Patient is resting in bed with family at the bedside. She reports she is doing well. No acute events overnight. ?We will start therapy today.  ? ?Objective: ?Vital signs in last 24 hours: ?Temp:  [97.5 ?F (36.4 ?C)-99.6 ?F (37.6 ?C)] 98.5 ?F (36.9 ?C) (03/22 8250) ?Pulse Rate:  [60-96] 81 (03/22 0552) ?Resp:  [10-21] 18 (03/22 0552) ?BP: (104-168)/(50-61) 152/53 (03/22 0552) ?SpO2:  [92 %-100 %] 92 % (03/22 0552) ?Weight:  [66.2 kg] 66.2 kg (03/21 0941) ? ?Intake/Output from previous day: ? ?Intake/Output Summary (Last 24 hours) at 06/01/2021 0752 ?Last data filed at 06/01/2021 5397 ?Gross per 24 hour  ?Intake 1387.5 ml  ?Output 1850 ml  ?Net -462.5 ml  ?  ? ?Intake/Output this shift: ?No intake/output data recorded. ? ?Labs: ?Recent Labs  ?  05/31/21 ?6734 06/01/21 ?0319  ?HGB 9.9* 8.7*  ? ?Recent Labs  ?  05/31/21 ?1937 06/01/21 ?0319  ?WBC 8.7 12.1*  ?RBC 2.94* 2.56*  ?HCT 29.5* 25.6*  ?PLT 281 221  ? ?Recent Labs  ?  05/31/21 ?9024 06/01/21 ?0319  ?NA 135 134*  ?K 3.7 3.9  ?CL 98 101  ?CO2 27 21*  ?BUN 38* 47*  ?CREATININE 3.33* 3.77*  ?GLUCOSE 82 120*  ?CALCIUM 9.3 8.8*  ? ?No results for input(s): LABPT, INR in the last 72 hours. ? ?Exam: ?General - Patient is Alert and Oriented ?Extremity - Neurologically intact ?Sensation intact distally ?Intact pulses distally ?Dorsiflexion/Plantar flexion intact ?Dressing - dressing C/D/I ?Motor Function - intact, moving foot and toes well on exam.  ? ?Past Medical History:  ?Diagnosis Date  ? Allergic rhinitis   ? Anxiety   ? Arthritis   ? Asthma   ? Carotid artery occlusion   ? bilateral ICA occlusions by neck CTA, diagnosed 04/2016  ? COPD (chronic obstructive pulmonary disease) ([place])   ? Coronary artery disease   ? ESRD (end stage renal disease) ([place])   ? hemodialysis initiated 04/09/20 Dialysis on  M/W/F  ? GERD (gastroesophageal reflux disease)   ? Heart murmur   ? History of hysterectomy   ? Hypothyroidism   ? Myocardial infarction [facility])   ? NSTEMI, s/p DES [id number] 04/30/20  ? Seizures ([place])   ? Stroke [facility]) 04/23/2016  ? acute/subacute left frontal CVA  ? ? ?Assessment/Plan: ?1 Day Post-Op Procedure(s) (LRB): ?TOTAL HIP ARTHROPLASTY ANTERIOR APPROACH (Right) ?Principal Problem: ?  S/P total right hip arthroplasty ? ?Estimated body mass index is 29.49 kg/m? as calculated from the following: ?  Height as of this encounter: 4\' 11"  (1.499 m). ?  Weight as of this encounter: 66.2 kg. ?Advance diet ?Up with therapy ?D/C IV fluids ? ?DVT Prophylaxis - Aspirin ?Weight bearing as tolerated. ? ?ESRD on dialysis - Cr. 3.77 this AM.  ?Hgb stable at 8.7 this AM. ? ?Plan is to go Home after hospital stay. Plan for discharge today after meeting goals with therapy. She is scheduled for dialysis on Friday.  ? ? ?Follow up in the office in 2 weeks.  ? ?Griffith Citron, PA-C ?Orthopedic Surgery ?[phone] ?06/01/2021, 7:52 AM  ?

## 2021-06-01 NOTE — Plan of Care (Signed)
  Problem: Activity: Goal: Ability to avoid complications of mobility impairment will improve Outcome: Progressing   Problem: Clinical Measurements: Goal: Postoperative complications will be avoided or minimized Outcome: Progressing   Problem: Pain Management: Goal: Pain level will decrease with appropriate interventions Outcome: Progressing   Problem: Health Behavior/Discharge Planning: Goal: Ability to manage health-related needs will improve Outcome: Progressing   

## 2021-06-01 NOTE — Plan of Care (Signed)
Discharge instructions given to the patient including medications. ?

## 2021-06-09 ENCOUNTER — Ambulatory Visit: Payer: Medicare HMO | Admitting: Allergy and Immunology

## 2021-06-14 ENCOUNTER — Telehealth (INDEPENDENT_AMBULATORY_CARE_PROVIDER_SITE_OTHER): Payer: Medicare HMO | Admitting: Neurology

## 2021-06-14 ENCOUNTER — Encounter: Payer: Self-pay | Admitting: Neurology

## 2021-06-14 VITALS — Ht 60.0 in | Wt 139.0 lb

## 2021-06-14 DIAGNOSIS — G40209 Localization-related (focal) (partial) symptomatic epilepsy and epileptic syndromes with complex partial seizures, not intractable, without status epilepticus: Secondary | ICD-10-CM | POA: Diagnosis not present

## 2021-06-14 MED ORDER — LEVETIRACETAM 500 MG PO TABS: ORAL_TABLET | ORAL | 11 refills | Status: DC

## 2021-06-14 NOTE — Discharge Summary (Signed)
Patient ID: ?Bethany Sosa ?MRN: [id number] ?DOB/AGE: [birth year] 66 y.o. ? ?Admit date: 05/31/2021 ?Discharge date: 06/01/2021 ? ?Admission Diagnoses:  ?Right hip osteoarthritis ? ?Discharge Diagnoses:  ?Principal Problem: ?  S/P total right hip arthroplasty ? ? ?Past Medical History:  ?Diagnosis Date  ? Allergic rhinitis   ? Anxiety   ? Arthritis   ? Asthma   ? Carotid artery occlusion   ? bilateral ICA occlusions by neck CTA, diagnosed 04/2016  ? COPD (chronic obstructive pulmonary disease) ([place])   ? Coronary artery disease   ? ESRD (end stage renal disease) ([place])   ? hemodialysis initiated 04/09/20 Dialysis on M/W/F  ? GERD (gastroesophageal reflux disease)   ? Heart murmur   ? History of hysterectomy   ? Hypothyroidism   ? Myocardial infarction [facility])   ? NSTEMI, s/p DES [id number] 04/30/20  ? Seizures ([place])   ? Stroke [facility]) 04/23/2016  ? acute/subacute left frontal CVA  ? ? ?Surgeries: Procedure(s): ?TOTAL HIP ARTHROPLASTY ANTERIOR APPROACH on 05/31/2021 ?  ?Consultants:  ? ?Discharged Condition: Improved ? ?Hospital Course: Bethany Sosa is an 66 y.o. female who was admitted 05/31/2021 for operative treatment ofS/P total right hip arthroplasty. Patient has severe unremitting pain that affects sleep, daily activities, and work/hobbies. After pre-op clearance the patient was taken to the operating room on 05/31/2021 and underwent  Procedure(s): ?TOTAL HIP ARTHROPLASTY ANTERIOR APPROACH.   ? ?Patient was given perioperative antibiotics:  ?Anti-infectives (From admission, onward)  ? ? Start     Dose/Rate Route Frequency Ordered Stop  ? 06/01/21 0000  cefadroxil (DURICEF) 500 MG capsule       ? 500 mg Oral Daily 06/01/21 0803 06/04/21 2359  ? 05/31/21 1830  ceFAZolin (ANCEF) IVPB 2g/100 mL premix       ? 2 g ?200 mL/hr over 30 Minutes Intravenous Every 8 hours 05/31/21 1518 06/01/21 1149  ? 05/31/21 1700  ceFAZolin (ANCEF) IVPB 2g/100 mL premix  Status:  Discontinued       ? 2 g ?200 mL/hr over 30 Minutes Intravenous Every 6  hours 05/31/21 1442 05/31/21 1518  ? 05/31/21 0915  ceFAZolin (ANCEF) IVPB 2g/100 mL premix       ? 2 g ?200 mL/hr over 30 Minutes Intravenous On call to O.R. 05/31/21 0909 06/01/21 1150  ? ?  ?  ? ?Patient was given sequential compression devices, early ambulation, and chemoprophylaxis to prevent DVT. Patient worked with PT and was meeting their goals regarding safe ambulation and transfers. ? ?Patient benefited maximally from hospital stay and there were no complications.   ? ?Recent vital signs: No data found.  ? ?Recent laboratory studies: No results for input(s): WBC, HGB, HCT, PLT, NA, K, CL, CO2, BUN, CREATININE, GLUCOSE, INR, CALCIUM in the last 72 hours. ? ?Invalid input(s): PT, 2 ? ? ?Discharge Medications:   ?Allergies as of 06/01/2021   ? ?   Reactions  ? Other Nausea And Vomiting  ? Codeine   ? GI issues  ? ?  ? ?  ?Medication List  ?  ? ?TAKE these medications   ? ?acetaminophen 500 MG tablet ?Commonly known as: TYLENOL ?Take 1,000 mg by mouth every 8 (eight) hours as needed for moderate pain. ?  ?albuterol (2.5 MG/3ML) 0.083% nebulizer solution ?Commonly known as: PROVENTIL ?Take 2.5 mg by nebulization every 4 (four) hours as needed for wheezing or shortness of breath. ?  ?albuterol 108 (90 Base) MCG/ACT inhaler ?Commonly known as: VENTOLIN HFA ?Can inhale two puffs  every four to six hours as needed for cough or wheeze. ?  ?aspirin 81 MG EC tablet ?Take 81 mg by mouth daily. Swallow whole. ?  ?atorvastatin 80 MG tablet ?Commonly known as: LIPITOR ?Take 80 mg by mouth daily. ?  ?Breztri Aerosphere 160-9-4.8 MCG/ACT Aero ?Generic drug: Budeson-Glycopyrrol-Formoterol ?Inhale 2 puffs into the lungs in the morning and at bedtime. ?  ?clopidogrel 75 MG tablet ?Commonly known as: PLAVIX ?Take 75 mg by mouth daily. ?  ?COLLAGEN PO ?Take 1 capsule by mouth in the morning, at noon, and at bedtime. ?  ?docusate sodium 100 MG capsule ?Commonly known as: COLACE ?Take 1 capsule (100 mg total) by mouth 2 (two) times  daily. ?  ?famotidine 20 MG tablet ?Commonly known as: PEPCID ?Take 20 mg by mouth 2 (two) times daily. ?  ?HYDROmorphone 2 MG tablet ?Commonly known as: DILAUDID ?Take 1-2 tablets (2-4 mg total) by mouth every 6 (six) hours as needed for severe pain. ?  ?hydrOXYzine 25 MG tablet ?Commonly known as: ATARAX ?Take 25 mg by mouth every 6 (six) hours as needed for anxiety. ?  ?levETIRAcetam 500 MG tablet ?Commonly known as: KEPPRA ?Take 1 tablet twice a day. Take additional 1 tablet after dialysis. ?  ?levothyroxine 200 MCG tablet ?Commonly known as: SYNTHROID ?Take 200 mcg by mouth daily before breakfast. ?  ?lidocaine-prilocaine cream ?Commonly known as: EMLA ?Apply 1 application topically as needed (port access). ?  ?methocarbamol 500 MG tablet ?Commonly known as: ROBAXIN ?Take 1 tablet (500 mg total) by mouth every 8 (eight) hours as needed for muscle spasms. ?  ?metoprolol tartrate 25 MG tablet ?Commonly known as: LOPRESSOR ?Take 25 mg by mouth 2 (two) times daily. ?  ?nitroGLYCERIN 0.4 MG SL tablet ?Commonly known as: NITROSTAT ?Place 0.4 mg under the tongue every 5 (five) minutes as needed for chest pain. ?  ?omeprazole 40 MG capsule ?Commonly known as: PRILOSEC ?TAKE 1 CAPSULE BY MOUTH IN THE MORNING AS DIRECTED ?  ?ondansetron 4 MG tablet ?Commonly known as: ZOFRAN ?Take 4 mg by mouth 2 (two) times daily. ?  ?polyethylene glycol 17 g packet ?Commonly known as: MIRALAX / GLYCOLAX ?Take 17 g by mouth daily as needed for mild constipation. ?  ?sevelamer carbonate 800 MG tablet ?Commonly known as: RENVELA ?Take 800 mg by mouth with breakfast, with lunch, and with evening meal. ?  ?vitamin B-12 500 MCG tablet ?Commonly known as: CYANOCOBALAMIN ?Take 500 mcg by mouth daily. ?  ? ?  ? ?ASK your doctor about these medications   ? ?cefadroxil 500 MG capsule ?Commonly known as: DURICEF ?Take 1 capsule (500 mg total) by mouth daily for 3 doses. Take 1 capsule by mouth daily on M/W/F after dialysis for 1 week ?Ask about:  Should I take this medication? ?  ? ?  ? ?  ?  ? ? ?  ?Discharge Care Instructions  ?(From admission, onward)  ?  ? ? ?  ? ?  Start     Ordered  ? 06/01/21 0000  Change dressing       ?Comments: Maintain surgical dressing until follow up in the clinic. If the edges start to pull up, may reinforce with tape. If the dressing is no longer working, may remove and cover with gauze and tape, but must keep the area dry and clean.  Call with any questions or concerns.  ? 06/01/21 0803  ? ?  ?  ? ?  ? ? ?Diagnostic Studies: DG Pelvis Portable ? ?  Result Date: 05/31/2021 ?CLINICAL DATA:  Right hip replacement. EXAM: PORTABLE PELVIS 1-2 VIEWS COMPARISON:  CT abdomen pelvis dated April 12, 2020. FINDINGS: The right hip demonstrates a total arthroplasty without evidence of hardware failure or complication. There is no fracture or dislocation. The alignment is anatomic. Post-surgical changes noted in the surrounding soft tissues. Prior left total hip arthroplasty. IMPRESSION: 1. Interval right total hip arthroplasty without acute postoperative complication. Electronically Signed   By: Titus Dubin M.D.   On: 05/31/2021 14:25  ? ?DG C-Arm 1-60 Min-No Report ? ?Result Date: 05/31/2021 ?Fluoroscopy was utilized by the requesting physician.  No radiographic interpretation.  ? ?DG C-Arm 1-60 Min-No Report ? ?Result Date: 05/31/2021 ?Fluoroscopy was utilized by the requesting physician.  No radiographic interpretation.  ? ?DG HIP PORT UNILAT WITH PELVIS 1V RIGHT ? ?Result Date: 05/31/2021 ?CLINICAL DATA:  Right hip replacement EXAM: DG HIP (WITH OR WITHOUT PELVIS) 1V PORT RIGHT COMPARISON:  03/13/2019 FINDINGS: C-arm images in the operating room demonstrate right hip replacement. The acetabular cup is in good position. The femoral stem is in good position. The femoral head prosthesis does not appear to be in place. Normal alignment. No fracture. Pre-existing left hip replacement without complication. IMPRESSION: Right hip replacement.  Femoral head prosthesis not identified on the images obtained. Electronically Signed   By: Franchot Gallo M.D.   On: 05/31/2021 12:30   ? ?Disposition: Discharge disposition: 01-Home or Self Care ? ?

## 2021-06-14 NOTE — Progress Notes (Signed)
? ?Virtual Visit via Video Note ?The purpose of this virtual visit is to provide medical care while limiting exposure to the novel coronavirus.   ? ?Consent was obtained for video visit:  Yes.   ?Answered questions that patient had about telehealth interaction:  Yes.   ?I discussed the limitations, risks, security and privacy concerns of performing an evaluation and management service by telemedicine. I also discussed with the patient that there may be a patient responsible charge related to this service. The patient expressed understanding and agreed to proceed. ? ?Pt location: Home ?Physician Location: office ?Name of referring provider:  Nodal, Alphonzo Dublin, PA-C ?I connected with Bethany Sosa at patients initiation/request on 06/14/2021 at  8:30 AM EDT by video enabled telemedicine application and verified that I am speaking with the correct person using two identifiers. ?Pt MRN:  [id number] ?Pt DOB:  1955/12/01 ?Video Participants:  Bethany Sosa ? ? ?History of Present Illness:  ?The patient had a virtual video visit on 06/14/2021. She was last seen in the neurology clinic 4 months ago for focal post-stroke seizures. She is alone for today's visit. She reports doing well from a seizure standpoint with no seizures since 2020. She is on Levetiracetam 500mg  BID with additional dose after dialysis. She denies any staring/unresponsive episodes, gaps in time, olfactory/gustatory hallucinations, right-sided shaking, confusion. She had a right hip arthroplasty 2 weeks ago and states she is recovering well. She has occasional headaches, no dizziness, vision changes. Sleep is good. She had been driving up until the surgery.  ? ? ?History on Initial Assessment 08/14/2016: This is a pleasant 66 yo RH woman with a history of hyperlipidemia, CAD s/p CABG, bilateral carotid artery stenosis (?occlusion), left frontal stroke in February 2018, presenting for seizures. Her granddaughter reports the first episode occurred in  January 2018, she witnessed upper body shaking with difficulty speaking lasting a few minutes, then she was back to baseline. On 04/22/16, she felt that "something was wrong with me." She denied any focal weakness. Per PCP notes, she was "saying weird off the wall and repeating herself." She was admitted to [facility] for 3 days where she saw Teleneurology and was diagnosed with a stroke. She was reported to have complete blockage of both carotid arteries. Her family reports that after the stroke, she started having episodes of her right leg giving out on her. One time in March while at the store, she was using her walker and told her daughter she needed to sit down. She apparently passed out and slumped to her right side, unconscious for a couple of seconds. No shaking noted by daughter. Her granddaughter reports an episode at the kitchen table where she had shaking of the right leg and arm for a split second. She felt this coming on, like she could not work her leg. Her granddaughter reported she would not talk until the shaking stopped, then denied any post-event weakness or confusion. It appears she would have these right-sided episodes every couple of weeks. No associated tongue bite or incontinence. The last one occurred 06/23/16, she was not feeling good that day and was helped to the bedside commode. She used the commode then her head went back and she passed out for a few seconds. When she came to, EMS was around her. She did not recall having any focal weakness. She had an MRI without contrast on 06/24/16 which I personally reviewed, there was some continued restricted diffusion in the left ACA territory similar to  prior MRI in February, extending to the level of the anterior genu of the corpus callosum. No new infarcts. The temporal lobes were symmetric. She was discharged home on Keppra 500mg  BID. Since then, her family denies any further right-sided symptoms or loss of consciousness. She denies any  side effects on the Keppra. She denies any olfactory/gustatory hallucinations, deja vu, rising epigastric sensation, gaps in time, staring/unresponsive episodes. She had memory changes after the stroke and reports there are still some bits and pieces that she cannot remember. Prior to the stroke, she was driving without difficulties, no missed bills payments (her daughter has been in charge of finances since the stroke). Her daughter has been helping with medications since the stroke, prior to this she was forgetting every now and then.  ?  ?She denies any headaches, dizziness, diplopia, dysarthria/dysphagia, neck/back pain, bowel/bladder dysfunction. It appears she was complaining of cough in September 2017 and had a chest xray which showed mild compression deformities in the mid-thoracic spine of indeterminate age. She was reporting back pain to her PCP and was referred to Ortho. She currently denies any back pain. She lives with her children. Her father had Alzheimer's disease. She denies any history of alcohol use.  She had a normal birth and early development.  There is no history of febrile convulsions, CNS infections such as meningitis/encephalitis, significant traumatic brain injury, neurosurgical procedures, or family history of seizures. ?  ?Current Outpatient Medications on File Prior to Visit  ?Medication Sig Dispense Refill  ? acetaminophen (TYLENOL) 500 MG tablet Take 1,000 mg by mouth every 8 (eight) hours as needed for moderate pain.    ? albuterol (PROVENTIL) (2.5 MG/3ML) 0.083% nebulizer solution Take 2.5 mg by nebulization every 4 (four) hours as needed for wheezing or shortness of breath.    ? albuterol (VENTOLIN HFA) 108 (90 Base) MCG/ACT inhaler Can inhale two puffs every four to six hours as needed for cough or wheeze. 18 g 0  ? aspirin 81 MG EC tablet Take 81 mg by mouth daily. Swallow whole.    ? atorvastatin (LIPITOR) 80 MG tablet Take 80 mg by mouth daily.    ?  Budeson-Glycopyrrol-Formoterol (BREZTRI AEROSPHERE) 160-9-4.8 MCG/ACT AERO Inhale 2 puffs into the lungs in the morning and at bedtime. 10.7 g 5  ? clopidogrel (PLAVIX) 75 MG tablet Take 75 mg by mouth daily.    ? COLLAGEN PO Take 1 capsule by mouth in the morning, at noon, and at bedtime.    ? docusate sodium (COLACE) 100 MG capsule Take 1 capsule (100 mg total) by mouth 2 (two) times daily. 10 capsule 0  ? famotidine (PEPCID) 20 MG tablet Take 20 mg by mouth 2 (two) times daily.    ? HYDROmorphone (DILAUDID) 2 MG tablet Take 1-2 tablets (2-4 mg total) by mouth every 6 (six) hours as needed for severe pain. 42 tablet 0  ? hydrOXYzine (ATARAX/VISTARIL) 25 MG tablet Take 25 mg by mouth every 6 (six) hours as needed for anxiety.    ? levETIRAcetam (KEPPRA) 500 MG tablet Take 1 tablet twice a day. Take additional 1 tablet after dialysis. 75 tablet 11  ? levothyroxine (SYNTHROID) 200 MCG tablet Take 200 mcg by mouth daily before breakfast.    ? lidocaine-prilocaine (EMLA) cream Apply 1 application topically as needed (port access).    ? methocarbamol (ROBAXIN) 500 MG tablet Take 1 tablet (500 mg total) by mouth every 8 (eight) hours as needed for muscle spasms. 40 tablet 0  ?  metoprolol tartrate (LOPRESSOR) 25 MG tablet Take 25 mg by mouth 2 (two) times daily.    ? nitroGLYCERIN (NITROSTAT) 0.4 MG SL tablet Place 0.4 mg under the tongue every 5 (five) minutes as needed for chest pain.    ? omeprazole (PRILOSEC) 40 MG capsule TAKE 1 CAPSULE BY MOUTH IN THE MORNING AS DIRECTED 90 capsule 0  ? ondansetron (ZOFRAN) 4 MG tablet Take 4 mg by mouth 2 (two) times daily.    ? sevelamer carbonate (RENVELA) 800 MG tablet Take 800 mg by mouth with breakfast, with lunch, and with evening meal.    ? vitamin B-12 (CYANOCOBALAMIN) 500 MCG tablet Take 500 mcg by mouth daily.    ? ?No current facility-administered medications on file prior to visit.  ? ? ? ?Observations/Objective:   ?Vitals:  ? 06/14/21 0805  ?Weight: 139 lb (63 kg)   ?Height: 5' (1.524 m)  ? ?GEN:  The patient appears stated age and is in NAD. ? ?Neurological examination: Patient is awake, alert. No aphasia or dysarthria. Intact fluency and comprehension. Cranial nerves: Extraocular movements intact

## 2021-06-14 NOTE — Patient Instructions (Signed)
Good to see you. Continue Levetiracetam (Keppra) 500mg  twice a day with additional dose after dialysis. Follow-up in 8 months, call for any changes.  ? ? ?Seizure Precautions: ?1. If medication has been prescribed for you to prevent seizures, take it exactly as directed.  Do not stop taking the medicine without talking to your doctor first, even if you have not had a seizure in a long time.  ? ?2. Avoid activities in which a seizure would cause danger to yourself or to others.  Don't operate dangerous machinery, swim alone, or climb in high or dangerous places, such as on ladders, roofs, or girders.  Do not drive unless your doctor says you may. ? ?3. If you have any warning that you may have a seizure, lay down in a safe place where you can't hurt yourself.   ? ?4.  No driving for 6 months from last seizure, as per [facility].   Please refer to the following link on the [place] website for more information: http://www.epilepsyfoundation.org/answerplace/Social/driving/drivingu.cfm  ? ?5.  Maintain good sleep hygiene. Avoid alcohol. ? ?6.  Contact your doctor if you have any problems that may be related to the medicine you are taking. ? ?7.  Call 911 and bring the patient back to the ED if: ?      ? A.  The seizure lasts longer than 5 minutes.      ? B.  The patient doesn't awaken shortly after the seizure ? C.  The patient has new problems such as difficulty seeing, speaking or moving ? D.  The patient was injured during the seizure ? E.  The patient has a temperature over 102 F (39C) ? F.  The patient vomited and now is having trouble breathing ?      ? ?

## 2021-08-19 ENCOUNTER — Encounter: Payer: Self-pay | Admitting: Neurology

## 2021-09-03 ENCOUNTER — Other Ambulatory Visit: Payer: Self-pay | Admitting: Allergy and Immunology

## 2022-02-17 ENCOUNTER — Ambulatory Visit: Payer: Medicare HMO | Admitting: Neurology

## 2022-02-20 ENCOUNTER — Ambulatory Visit (INDEPENDENT_AMBULATORY_CARE_PROVIDER_SITE_OTHER): Payer: Medicare HMO | Admitting: Neurology

## 2022-02-20 ENCOUNTER — Encounter: Payer: Self-pay | Admitting: Neurology

## 2022-02-20 VITALS — BP 127/72 | HR 87 | Ht 60.0 in | Wt 155.8 lb

## 2022-02-20 DIAGNOSIS — G40209 Localization-related (focal) (partial) symptomatic epilepsy and epileptic syndromes with complex partial seizures, not intractable, without status epilepticus: Secondary | ICD-10-CM | POA: Diagnosis not present

## 2022-02-20 MED ORDER — LEVETIRACETAM 500 MG PO TABS: ORAL_TABLET | ORAL | 3 refills | Status: DC

## 2022-02-20 NOTE — Patient Instructions (Signed)
Good to see you doing well. Continue Levetiracetam 500mg  twice a day and after dialysis. Follow-up in 1 year, call for any changes.   Seizure Precautions: 1. If medication has been prescribed for you to prevent seizures, take it exactly as directed.  Do not stop taking the medicine without talking to your doctor first, even if you have not had a seizure in a long time.   2. Avoid activities in which a seizure would cause danger to yourself or to others.  Don't operate dangerous machinery, swim alone, or climb in high or dangerous places, such as on ladders, roofs, or girders.  Do not drive unless your doctor says you may.  3. If you have any warning that you may have a seizure, lay down in a safe place where you can't hurt yourself.    4.  No driving for 6 months from last seizure, as per [facility].   Please refer to the following link on the [place] website for more information: http://www.epilepsyfoundation.org/answerplace/Social/driving/drivingu.cfm   5.  Maintain good sleep hygiene. Avoid alcohol.  6.  Contact your doctor if you have any problems that may be related to the medicine you are taking.  7.  Call 911 and bring the patient back to the ED if:        A.  The seizure lasts longer than 5 minutes.       B.  The patient doesn't awaken shortly after the seizure  C.  The patient has new problems such as difficulty seeing, speaking or moving  D.  The patient was injured during the seizure  E.  The patient has a temperature over 102 F (39C)  F.  The patient vomited and now is having trouble breathing

## 2022-02-20 NOTE — Progress Notes (Signed)
NEUROLOGY FOLLOW UP OFFICE NOTE  Bethany Sosa [id number] 09-21-1955  HISTORY OF PRESENT ILLNESS: I had the pleasure of seeing Bethany Sosa in follow-up in the neurology clinic on 02/20/2022.  The patient was last seen 8 months ago for focal post-stroke seizures. She is accompanied by her grandson who helps supplement the history today. Records and images were personally reviewed where available. Since her last visit, they continue to deny any seizures or seizure-like symptoms since 2020. No staring/unresponsive episodes, gaps in time, right-sided shaking, olfactory/gustatory hallucinations, focal numbness/tingling/weakness, myoclonic jerks. She had some numbness in both hands after sitting in a long movie recently. Her grandson sets her medications for her, no missed doses. She is on Levetiracetam 500mg  BID with additional dose after dialysis. He mostly does the driving, but lets her drive herself to dialysis. She denies any headaches, dizziness, vision changes, no falls. She gets 6 hours of sleep and naps during the day. Mood is good.    History on Initial Assessment 08/14/2016: This is a pleasant 66 yo RH woman with a history of hyperlipidemia, CAD s/p CABG, bilateral carotid artery stenosis (?occlusion), left frontal stroke in February 2018, presenting for seizures. Her granddaughter reports the first episode occurred in January 2018, she witnessed upper body shaking with difficulty speaking lasting a few minutes, then she was back to baseline. On 04/22/16, she felt that "something was wrong with me." She denied any focal weakness. Per PCP notes, she was "saying weird off the wall and repeating herself." She was admitted to [facility] for 3 days where she saw Teleneurology and was diagnosed with a stroke. She was reported to have complete blockage of both carotid arteries. Her family reports that after the stroke, she started having episodes of her right leg giving out on her. One time in  March while at the store, she was using her walker and told her daughter she needed to sit down. She apparently passed out and slumped to her right side, unconscious for a couple of seconds. No shaking noted by daughter. Her granddaughter reports an episode at the kitchen table where she had shaking of the right leg and arm for a split second. She felt this coming on, like she could not work her leg. Her granddaughter reported she would not talk until the shaking stopped, then denied any post-event weakness or confusion. It appears she would have these right-sided episodes every couple of weeks. No associated tongue bite or incontinence. The last one occurred 06/23/16, she was not feeling good that day and was helped to the bedside commode. She used the commode then her head went back and she passed out for a few seconds. When she came to, EMS was around her. She did not recall having any focal weakness. She had an MRI without contrast on 06/24/16 which I personally reviewed, there was some continued restricted diffusion in the left ACA territory similar to prior MRI in February, extending to the level of the anterior genu of the corpus callosum. No new infarcts. The temporal lobes were symmetric. She was discharged home on Keppra 500mg  BID. Since then, her family denies any further right-sided symptoms or loss of consciousness. She denies any side effects on the Keppra. She denies any olfactory/gustatory hallucinations, deja vu, rising epigastric sensation, gaps in time, staring/unresponsive episodes. She had memory changes after the stroke and reports there are still some bits and pieces that she cannot remember. Prior to the stroke, she was driving without difficulties, no missed bills  payments (her daughter has been in charge of finances since the stroke). Her daughter has been helping with medications since the stroke, prior to this she was forgetting every now and then.    She denies any headaches, dizziness,  diplopia, dysarthria/dysphagia, neck/back pain, bowel/bladder dysfunction. It appears she was complaining of cough in September 2017 and had a chest xray which showed mild compression deformities in the mid-thoracic spine of indeterminate age. She was reporting back pain to her PCP and was referred to Ortho. She currently denies any back pain. She lives with her children. Her father had Alzheimer's disease. She denies any history of alcohol use.  She had a normal birth and early development.  There is no history of febrile convulsions, CNS infections such as meningitis/encephalitis, significant traumatic brain injury, neurosurgical procedures, or family history of seizures.  PAST MEDICAL HISTORY: Past Medical History:  Diagnosis Date   Allergic rhinitis    Anxiety    Arthritis    Asthma    Carotid artery occlusion    bilateral ICA occlusions by neck CTA, diagnosed 04/2016   COPD (chronic obstructive pulmonary disease) (HCC)    Coronary artery disease    ESRD (end stage renal disease) ([place])    hemodialysis initiated 04/09/20 Dialysis on M/W/F   GERD (gastroesophageal reflux disease)    Heart murmur    History of hysterectomy    Hypothyroidism    Myocardial infarction [facility])    NSTEMI, s/p DES SVG-OM1 04/30/20   Seizures ([place])    Stroke ([place]) 04/23/2016   acute/subacute left frontal CVA    MEDICATIONS: Current Outpatient Medications on File Prior to Visit  Medication Sig Dispense Refill   acetaminophen (TYLENOL) 500 MG tablet Take 1,000 mg by mouth every 8 (eight) hours as needed for moderate pain.     aspirin 81 MG EC tablet Take 81 mg by mouth daily. Swallow whole.     atorvastatin (LIPITOR) 80 MG tablet Take 80 mg by mouth daily.     BREZTRI AEROSPHERE 160-9-4.8 MCG/ACT AERO INHALE 2 PUFFS IN THE MORNING AND AT BEDTIME 10.7 g 5   clopidogrel (PLAVIX) 75 MG tablet Take 75 mg by mouth daily.     COLLAGEN PO Take 1 capsule by mouth in the morning, at noon, and at bedtime.     docusate  sodium (COLACE) 100 MG capsule Take 1 capsule (100 mg total) by mouth 2 (two) times daily. 10 capsule 0   famotidine (PEPCID) 20 MG tablet Take 20 mg by mouth 2 (two) times daily.     hydrOXYzine (ATARAX/VISTARIL) 25 MG tablet Take 25 mg by mouth every 6 (six) hours as needed for anxiety.     levETIRAcetam (KEPPRA) 500 MG tablet Take 1 tablet twice a day. Take additional 1 tablet after dialysis. 75 tablet 11   levothyroxine (SYNTHROID) 200 MCG tablet Take 200 mcg by mouth daily before breakfast.     lidocaine-prilocaine (EMLA) cream Apply 1 application topically as needed (port access).     methocarbamol (ROBAXIN) 500 MG tablet Take 1 tablet (500 mg total) by mouth every 8 (eight) hours as needed for muscle spasms. 40 tablet 0   metoprolol tartrate (LOPRESSOR) 25 MG tablet Take 25 mg by mouth 2 (two) times daily.     nitroGLYCERIN (NITROSTAT) 0.4 MG SL tablet Place 0.4 mg under the tongue every 5 (five) minutes as needed for chest pain.     omeprazole (PRILOSEC) 40 MG capsule TAKE 1 CAPSULE BY MOUTH IN THE MORNING AS DIRECTED  90 capsule 0   ondansetron (ZOFRAN) 4 MG tablet Take 4 mg by mouth 2 (two) times daily.     sevelamer carbonate (RENVELA) 800 MG tablet Take 800 mg by mouth with breakfast, with lunch, and with evening meal.     vitamin B-12 (CYANOCOBALAMIN) 500 MCG tablet Take 500 mcg by mouth daily.     No current facility-administered medications on file prior to visit.    ALLERGIES: Allergies  Allergen Reactions   Other Nausea And Vomiting   Codeine     GI issues    FAMILY HISTORY: Family History  Problem Relation Age of Onset   Asthma Father     SOCIAL HISTORY: Social History   Socioeconomic History   Marital status: Widowed    Spouse name: Not on file   Number of children: Not on file   Years of education: Not on file   Highest education level: Not on file  Occupational History   Not on file  Tobacco Use   Smoking status: Former    Types: Cigarettes    Quit  date: 2019    Years since quitting: 4.9   Smokeless tobacco: Never  Vaping Use   Vaping Use: Never used  Substance and Sexual Activity   Alcohol use: Not Currently   Drug use: No   Sexual activity: Not Currently  Other Topics Concern   Not on file  Social History Narrative   Right handed    Social Determinants of Health   Financial Resource Strain: Not on file  Food Insecurity: Not on file  Transportation Needs: Not on file  Physical Activity: Not on file  Stress: Not on file  Social Connections: Not on file  Intimate Partner Violence: Not on file     PHYSICAL EXAM: Vitals:   02/20/22 1536  BP: 127/72  Pulse: 87  SpO2: 97%   General: No acute distress Head:  Normocephalic/atraumatic Skin/Extremities: No rash, no edema Neurological Exam: alert and awake. No aphasia or dysarthria. Fund of knowledge is appropriate.  Attention and concentration are normal.   Cranial nerves: Pupils equal, round. Extraocular movements intact with no nystagmus. Visual fields full.  No facial asymmetry.  Motor: Bulk and tone normal, muscle strength 5/5 throughout with no pronator drift.   Finger to nose testing intact.  Gait narrow-based and steady, no ataxia. No tremors.   IMPRESSION: This is a pleasant 66 yo RH woman with a history of hyperlipidemia, CAD s/p CABG, bilateral carotid artery stenosis (?occlusion), left ACA stroke in February 2018, with subsequent focal seizures. She describes a sensation of inability to control her right leg, with right leg and arm shaking. Family also described episodes of loss of consciousness with no clear shaking. EEG showed occasional left frontotemporal slowing, no epileptiform discharges. She continues to do well seizure-free since 2020 on Levetiracetam 500mg  BID with additional dose after HD, refills sent. She is aware of [place] driving laws to stop driving after a seizure until 6 months seizure-free. Follow-up in 1 year, call for any changes.   Thank you for  allowing me to participate in her care.  Please do not hesitate to call for any questions or concerns.    Ellouise Newer, M.D.   CCAgnes Lawrence, PA-C

## 2022-04-24 ENCOUNTER — Other Ambulatory Visit: Payer: Self-pay | Admitting: Allergy and Immunology

## 2022-05-27 ENCOUNTER — Other Ambulatory Visit: Payer: Self-pay | Admitting: Allergy and Immunology

## 2022-06-01 ENCOUNTER — Ambulatory Visit (INDEPENDENT_AMBULATORY_CARE_PROVIDER_SITE_OTHER): Payer: Medicare HMO | Admitting: Allergy and Immunology

## 2022-06-01 VITALS — BP 128/62 | HR 78 | Temp 98.0°F | Resp 18

## 2022-06-01 DIAGNOSIS — J3089 Other allergic rhinitis: Secondary | ICD-10-CM

## 2022-06-01 DIAGNOSIS — K219 Gastro-esophageal reflux disease without esophagitis: Secondary | ICD-10-CM

## 2022-06-01 DIAGNOSIS — J4489 Other specified chronic obstructive pulmonary disease: Secondary | ICD-10-CM | POA: Diagnosis not present

## 2022-06-01 NOTE — Progress Notes (Signed)
[facility] [place] [facility] [place]   Follow-up Note  Referring Provider: Maggie Schwalbe, PA-C Primary Provider: Maggie Schwalbe, PA-C Date of Office Visit: 06/01/2022  Subjective:   Bethany Sosa (DOB: 05-18-1955) is a 67 y.o. female who returns to the [facility] [place] on 06/01/2022 in re-evaluation of the following:  HPI: Bethany Sosa returns to the clinic in evaluation of COPD/asthma overlap, allergic rhinitis, LPR.  I last saw her in this clinic 31 March 2021.  She has had an excellent year regarding her respiratory tract while using her Breztri twice a day.  While doing so she rarely uses the short acting bronchodilator.  She has had no need to use any nasal steroids and occasionally uses an antihistamine.  She has not required a systemic steroid or an antibiotic for an airway issue.  She believes that her reflux is under excellent control while using over-the-counter Prevacid.  She obtained a flu vaccine this year.  Allergies as of 06/01/2022       Reactions   Pholcodine Other (See Comments)   GI intolerance    Codeine    GI issues        Medication List    acetaminophen 500 MG tablet Commonly known as: TYLENOL Take 1,000 mg by mouth every 8 (eight) hours as needed for moderate pain.   aspirin EC 81 MG tablet Take 81 mg by mouth daily. Swallow whole.   atorvastatin 80 MG tablet Commonly known as: LIPITOR Take 80 mg by mouth daily.   Breztri Aerosphere 160-9-4.8 MCG/ACT Aero Generic drug: Budeson-Glycopyrrol-Formoterol INHALE 2 PUFFS IN THE MORNING AND AT BEDTIME   clopidogrel 75 MG tablet Commonly known as: PLAVIX Take 75 mg by mouth daily.   COLLAGEN PO Take 1 capsule by mouth in the morning, at noon, and at bedtime.   docusate sodium 100 MG capsule Commonly known as: COLACE Take 1 capsule (100 mg total) by mouth 2 (two) times daily.   hydrOXYzine 25 MG tablet Commonly known as: ATARAX Take 25 mg by mouth  every 6 (six) hours as needed for anxiety.   lansoprazole 30 MG capsule Commonly known as: PREVACID Take 30 mg by mouth daily at 12 noon.   levETIRAcetam 500 MG tablet Commonly known as: KEPPRA Take 1 tablet twice a day. Take additional 1 tablet after dialysis.   levothyroxine 200 MCG tablet Commonly known as: SYNTHROID Take 200 mcg by mouth daily before breakfast.   lidocaine-prilocaine cream Commonly known as: EMLA Apply 1 application topically as needed (port access).   methocarbamol 500 MG tablet Commonly known as: ROBAXIN Take 1 tablet (500 mg total) by mouth every 8 (eight) hours as needed for muscle spasms.   metoprolol tartrate 25 MG tablet Commonly known as: LOPRESSOR Take 25 mg by mouth 2 (two) times daily.   nitroGLYCERIN 0.4 MG SL tablet Commonly known as: NITROSTAT Place 0.4 mg under the tongue every 5 (five) minutes as needed for chest pain.   ondansetron 4 MG tablet Commonly known as: ZOFRAN Take 4 mg by mouth 2 (two) times daily.   sevelamer carbonate 800 MG tablet Commonly known as: RENVELA Take 800 mg by mouth with breakfast, with lunch, and with evening meal.   vitamin B-12 500 MCG tablet Commonly known as: CYANOCOBALAMIN Take 500 mcg by mouth daily.     Past Medical History:  Diagnosis Date   Allergic rhinitis    Anxiety    Arthritis    Asthma  Carotid artery occlusion    bilateral ICA occlusions by neck CTA, diagnosed 04/2016   COPD (chronic obstructive pulmonary disease) (HCC)    Coronary artery disease    ESRD (end stage renal disease) ([place])    hemodialysis initiated 04/09/20 Dialysis on M/W/F   GERD (gastroesophageal reflux disease)    Heart murmur    History of hysterectomy    Hypothyroidism    Myocardial infarction [facility])    NSTEMI, s/p DES SVG-OM1 04/30/20   Seizures ([place])    Stroke ([place]) 04/23/2016   acute/subacute left frontal CVA    Past Surgical History:  Procedure Laterality Date   A/V FISTULAGRAM N/A 12/16/2020    Procedure: A/V FISTULAGRAM;  Surgeon: Marty Heck, MD;  Location: [place] [facility];  Service: Cardiovascular;  Laterality: N/A;   APPENDECTOMY     AV FISTULA PLACEMENT Left 10/19/2020   Procedure: LEFT BRACHIOCEPHALIC ARTERIOVENOUS (AV) FISTULA CREATION;  Surgeon: Elam Dutch, MD;  Location: [place];  Service: Vascular;  Laterality: Left;   CAROTID ENDARTERECTOMY Right 06/13/2005   CORONARY ARTERY BYPASS GRAFT  06/13/2005   CABG: LIMA-LAD, SVG-OM, SVG-RCA-PDA 06/13/05   LIGATION OF COMPETING BRANCHES OF ARTERIOVENOUS FISTULA Left 12/31/2020   Procedure: SIDE BRANCH LIGATION x3;  Surgeon: Marty Heck, MD;  Location: [place];  Service: Vascular;  Laterality: Left;   open heart surgery     REVISON OF ARTERIOVENOUS FISTULA Left 12/31/2020   Procedure: LEFT ARM ARTERIOVENOUS FISTULA REVISION;  Surgeon: Marty Heck, MD;  Location: [place];  Service: Vascular;  Laterality: Left;   TOTAL HIP ARTHROPLASTY Right 05/31/2021   Procedure: TOTAL HIP ARTHROPLASTY ANTERIOR APPROACH;  Surgeon: Paralee Cancel, MD;  Location: [facility];  Service: Orthopedics;  Laterality: Right;    Review of systems negative except as noted in HPI / PMHx or noted below:  Review of Systems  Constitutional: Negative.   HENT: Negative.    Eyes: Negative.   Respiratory: Negative.    Cardiovascular: Negative.   Gastrointestinal: Negative.   Genitourinary: Negative.   Musculoskeletal: Negative.   Skin: Negative.   Neurological: Negative.   Endo/Heme/Allergies: Negative.   Psychiatric/Behavioral: Negative.       Objective:   Vitals:   06/01/22 1026  BP: 128/62  Pulse: 78  Resp: 18  Temp: 98 F (36.7 C)  SpO2: 98%          Physical Exam Constitutional:      Appearance: She is not diaphoretic.  HENT:     Head: Normocephalic.     Right Ear: Tympanic membrane, ear canal and external ear normal.     Left Ear: Tympanic membrane, ear canal and external ear normal.     Nose: Nose normal. No  mucosal edema or rhinorrhea.     Mouth/Throat:     Pharynx: Uvula midline. No oropharyngeal exudate.  Eyes:     Conjunctiva/sclera: Conjunctivae normal.  Neck:     Thyroid: No thyromegaly.     Trachea: Trachea normal. No tracheal tenderness or tracheal deviation.  Cardiovascular:     Rate and Rhythm: Normal rate and regular rhythm.     Heart sounds: Normal heart sounds, S1 normal and S2 normal. No murmur heard. Pulmonary:     Effort: No respiratory distress.     Breath sounds: Normal breath sounds. No stridor. No wheezing or rales.  Lymphadenopathy:     Head:     Right side of head: No tonsillar adenopathy.     Left side of head: No tonsillar  adenopathy.     Cervical: No cervical adenopathy.  Skin:    Findings: No erythema or rash.     Nails: There is no clubbing.  Neurological:     Mental Status: She is alert.     Diagnostics:    Spirometry was performed and demonstrated an FEV1 of 0.84 at 43 % of predicted.  Assessment and Plan:   1. Asthma with COPD   2. Other allergic rhinitis   3. LPRD (laryngopharyngeal reflux disease)    1.  Continue to treat and prevent inflammation:   A. Breztri - 2 inhalations 2 times per day  2. Continue reflux treatment:   A. Minimize caffeine and chocolate consumption  B. Prevacid OTC - 1 time per day   3. If needed:   A. Proair HFA or albuterol nebulization   B. Antihistamine  4. Return in 1 year or earlier if problem.     Bethany Sosa appears to be doing wonderful on her current therapy and she will remain on Breztri to address her airway inflammation and Prevacid to address her reflux and assuming she does well with this plan I will see her back in this clinic in 1 year or earlier if there is a problem.  Allena Katz, MD Allergy / Immunology [place]

## 2022-06-01 NOTE — Patient Instructions (Addendum)
  1.  Continue to treat and prevent inflammation:   A. Breztri - 2 inhalations 2 times per day  2. Continue reflux treatment:   A. Minimize caffeine and chocolate consumption  B. Prevacid OTC - 1 time per day   3. If needed:   A. Proair HFA or albuterol nebulization   B. Antihistamine  4. Return in 1 year or earlier if problem.

## 2022-06-02 ENCOUNTER — Encounter: Payer: Self-pay | Admitting: Neurology

## 2022-06-05 ENCOUNTER — Encounter: Payer: Self-pay | Admitting: Allergy and Immunology

## 2022-06-06 ENCOUNTER — Other Ambulatory Visit: Payer: Self-pay

## 2022-06-06 MED ORDER — BREZTRI AEROSPHERE 160-9-4.8 MCG/ACT IN AERO: INHALATION_SPRAY | RESPIRATORY_TRACT | 5 refills | Status: DC

## 2022-09-30 IMAGING — XA DG HIP (WITH OR WITHOUT PELVIS) 1V PORT*R*
1 series · 4 of 4 positions shown · non-contrast
Comparison: 03/13/2019

CLINICAL DATA: Right hip replacement

EXAM:
DG HIP (WITH OR WITHOUT PELVIS) 1V PORT RIGHT

[Series 1: unknown protocol · 4 of 4 slices shown]
[im 1/4]
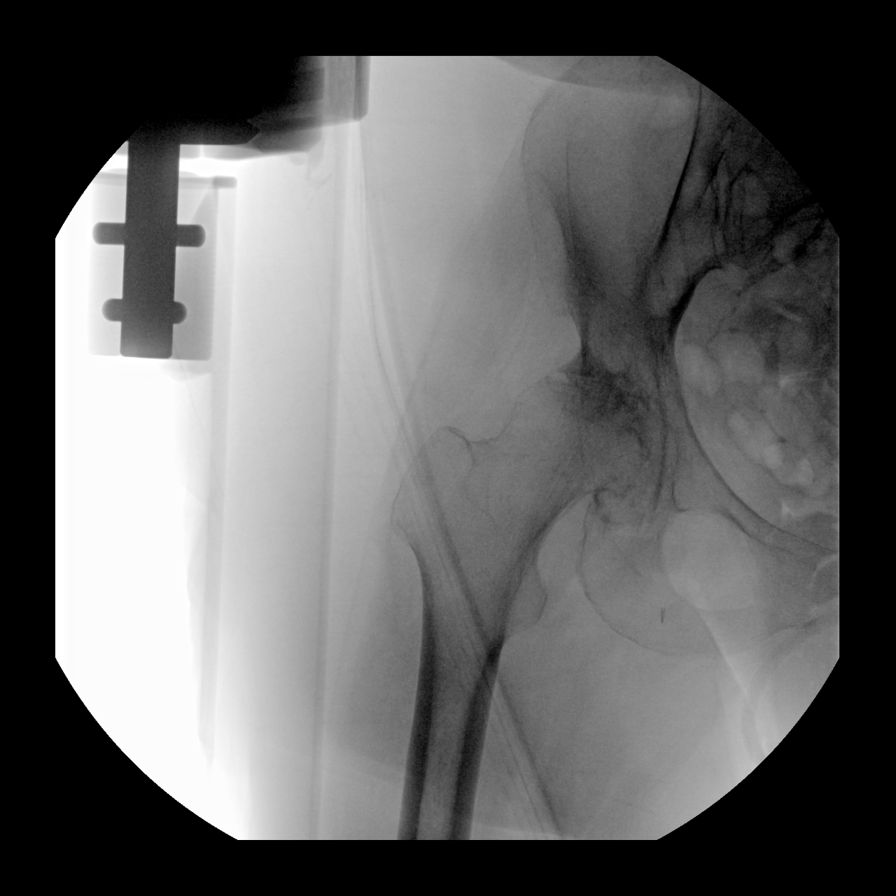
[im 2/4]
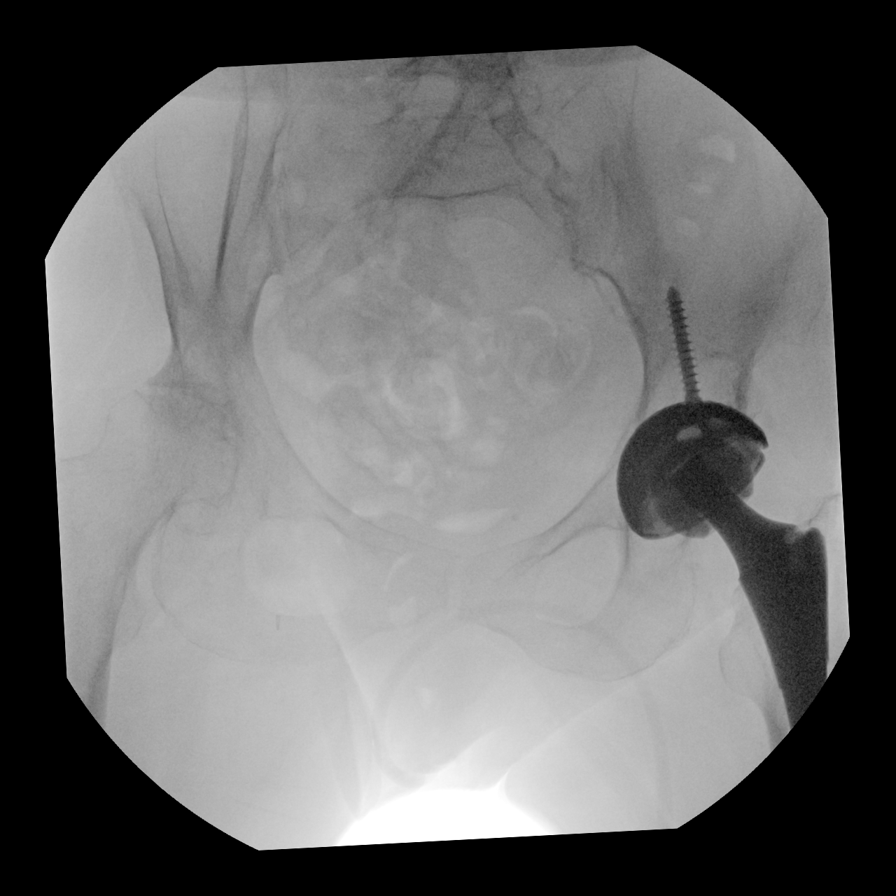
[im 3/4]
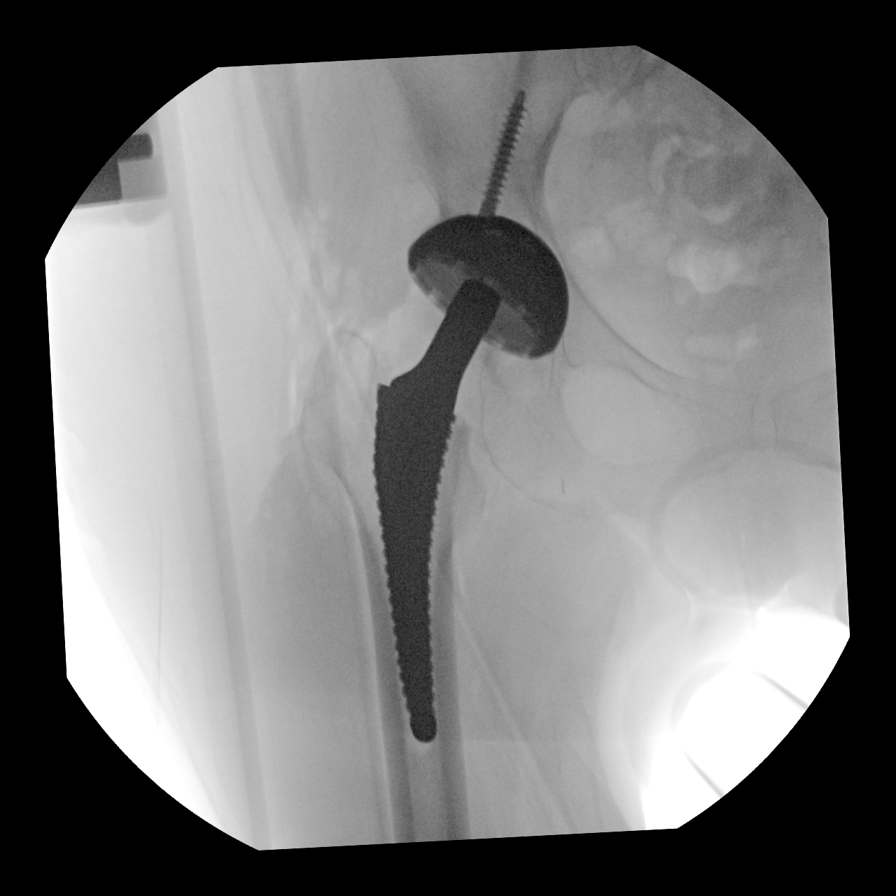
[im 4/4]
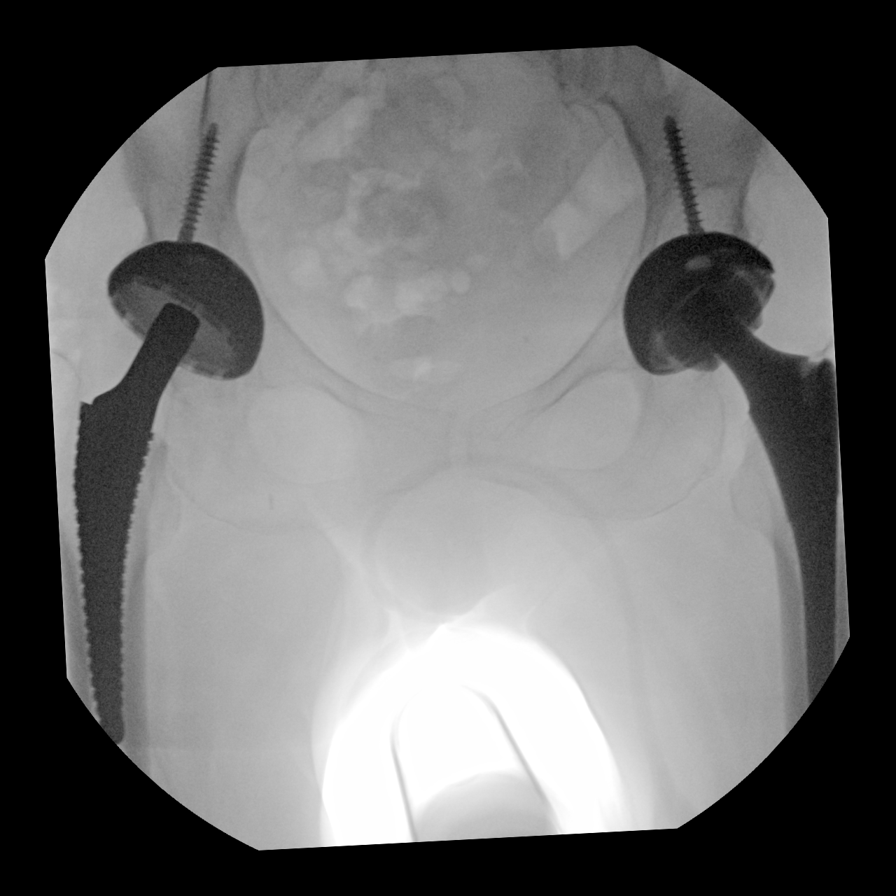

[4 of 4 positions shown; findings below may reference images not displayed]

FINDINGS: C-arm images in the operating room demonstrate right hip
replacement. The acetabular cup is in good position. The femoral
stem is in good position. The femoral head prosthesis does not
appear to be in place. Normal alignment. No fracture.

Pre-existing left hip replacement without complication.
IMPRESSION: Right hip replacement. Femoral head prosthesis not identified on the
images obtained.

## 2022-11-01 ENCOUNTER — Other Ambulatory Visit: Payer: Self-pay

## 2022-11-01 MED ORDER — ALBUTEROL SULFATE HFA 108 (90 BASE) MCG/ACT IN AERS: INHALATION_SPRAY | RESPIRATORY_TRACT | 1 refills | Status: DC

## 2022-11-11 ENCOUNTER — Other Ambulatory Visit: Payer: Self-pay | Admitting: Allergy and Immunology

## 2022-12-11 ENCOUNTER — Other Ambulatory Visit: Payer: Self-pay | Admitting: Allergy and Immunology

## 2023-02-12 ENCOUNTER — Ambulatory Visit: Payer: Medicare HMO | Admitting: Neurology

## 2023-02-20 ENCOUNTER — Ambulatory Visit (INDEPENDENT_AMBULATORY_CARE_PROVIDER_SITE_OTHER): Payer: Medicare HMO | Admitting: Neurology

## 2023-02-20 ENCOUNTER — Encounter: Payer: Self-pay | Admitting: Neurology

## 2023-02-20 VITALS — BP 111/63 | HR 84 | Ht 60.0 in | Wt 174.8 lb

## 2023-02-20 DIAGNOSIS — G40209 Localization-related (focal) (partial) symptomatic epilepsy and epileptic syndromes with complex partial seizures, not intractable, without status epilepticus: Secondary | ICD-10-CM

## 2023-02-20 MED ORDER — LEVETIRACETAM 500 MG PO TABS: ORAL_TABLET | ORAL | 3 refills | Status: DC

## 2023-02-20 NOTE — Progress Notes (Signed)
NEUROLOGY FOLLOW UP OFFICE NOTE  Bethany Sosa [id number] 07-02-1955  HISTORY OF PRESENT ILLNESS: I had the pleasure of seeing Bethany Sosa in follow-up in the neurology clinic on 02/20/2023.  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 She is again accompanied by her grandson who helps supplement the history today.  Records and images were personally reviewed where available. Since her last visit, she continues to do well seizure-free since 2020 on Levetiracetam 500mg  BID with additional dose after HD (M-W-F). She denies any staring/unresponsive episodes, gaps in time, right-sided shaking, olfactory/gustatory hallucinations, focal numbness/tingling/weakness, myoclonic jerks. No significant headaches, dizziness, vision changes, no falls. She has some back aches after standing for prolonged periods. She is on aspirin and Plavix for secondary stroke prevention. She usually gets 6 hours of sleep and naps sometimes. Mood is great. She does not drive. She lives with her grandson.    History on Initial Assessment 08/14/2016: This is a pleasant 67 yo RH woman with a history of hyperlipidemia, CAD s/p CABG, bilateral carotid artery stenosis (?occlusion), left frontal stroke in February 2018, presenting for seizures. Her granddaughter reports the first episode occurred in January 2018, she witnessed upper body shaking with difficulty speaking lasting a few minutes, then she was back to baseline. On 04/22/16, she felt that "something was wrong with me." She denied any focal weakness. Per PCP notes, she was "saying weird off the wall and repeating herself." She was admitted to [facility] for 3 days where she saw Teleneurology and was diagnosed with a stroke. She was reported to have complete blockage of both carotid arteries. Her family reports that after the stroke, she started having episodes of her right leg giving out on her. One time in March while at the store, she was  using her walker and told her daughter she needed to sit down. She apparently passed out and slumped to her right side, unconscious for a couple of seconds. No shaking noted by daughter. Her granddaughter reports an episode at the kitchen table where she had shaking of the right leg and arm for a split second. She felt this coming on, like she could not work her leg. Her granddaughter reported she would not talk until the shaking stopped, then denied any post-event weakness or confusion. It appears she would have these right-sided episodes every couple of weeks. No associated tongue bite or incontinence. The last one occurred 06/23/16, she was not feeling good that day and was helped to the bedside commode. She used the commode then her head went back and she passed out for a few seconds. When she came to, EMS was around her. She did not recall having any focal weakness. She had an MRI without contrast on 06/24/16 which I personally reviewed, there was some continued restricted diffusion in the left ACA territory similar to prior MRI in February, extending to the level of the anterior genu of the corpus callosum. No new infarcts. The temporal lobes were symmetric. She was discharged home on Keppra 500mg  BID. Since then, her family denies any further right-sided symptoms or loss of consciousness. She denies any side effects on the Keppra. She denies any olfactory/gustatory hallucinations, deja vu, rising epigastric sensation, gaps in time, staring/unresponsive episodes. She had memory changes after the stroke and reports there are still some bits and pieces that she cannot remember. Prior to the stroke, she was driving without difficulties, no missed bills payments (her daughter has been in charge  of finances since the stroke). Her daughter has been helping with medications since the stroke, prior to this she was forgetting every now and then.    She denies any headaches, dizziness, diplopia, dysarthria/dysphagia,  neck/back pain, bowel/bladder dysfunction. It appears she was complaining of cough in September 2017 and had a chest xray which showed mild compression deformities in the mid-thoracic spine of indeterminate age. She was reporting back pain to her PCP and was referred to Ortho. She currently denies any back pain. She lives with her children. Her father had Alzheimer's disease. She denies any history of alcohol use.  She had a normal birth and early development.  There is no history of febrile convulsions, CNS infections such as meningitis/encephalitis, significant traumatic brain injury, neurosurgical procedures, or family history of seizures.  PAST MEDICAL HISTORY: Past Medical History:  Diagnosis Date   Allergic rhinitis    Anxiety    Arthritis    Asthma    Carotid artery occlusion    bilateral ICA occlusions by neck CTA, diagnosed 04/2016   COPD (chronic obstructive pulmonary disease) (HCC)    Coronary artery disease    ESRD (end stage renal disease) (HCC)    hemodialysis initiated 04/09/20 Dialysis on M/W/F   GERD (gastroesophageal reflux disease)    Heart murmur    History of hysterectomy    Hypothyroidism    Myocardial infarction [facility])    NSTEMI, s/p DES SVG-OM1 04/30/20   Seizures (HCC)    Stroke (HCC) 04/23/2016   acute/subacute left frontal CVA    MEDICATIONS: Current Outpatient Medications on File Prior to Visit  Medication Sig Dispense Refill   acetaminophen (TYLENOL) 500 MG tablet Take 1,000 mg by mouth every 8 (eight) hours as needed for moderate pain.     aspirin 81 MG EC tablet Take 81 mg by mouth daily. Swallow whole.     atorvastatin (LIPITOR) 80 MG tablet Take 80 mg by mouth daily.     Budeson-Glycopyrrol-Formoterol (BREZTRI AEROSPHERE) 160-9-4.8 MCG/ACT AERO INHALE 2 PUFFS IN THE MORNING AND AT BEDTIME 11 g 5   clopidogrel (PLAVIX) 75 MG tablet Take 75 mg by mouth daily.     COLLAGEN PO Take 1 capsule by mouth in the morning, at noon, and at bedtime.     hydrOXYzine  (ATARAX/VISTARIL) 25 MG tablet Take 25 mg by mouth every 6 (six) hours as needed for anxiety.     lansoprazole (PREVACID) 30 MG capsule Take 30 mg by mouth daily at 12 noon.     levETIRAcetam (KEPPRA) 500 MG tablet Take 1 tablet twice a day. Take additional 1 tablet after dialysis. 225 tablet 3   levothyroxine (SYNTHROID) 200 MCG tablet Take 200 mcg by mouth daily before breakfast.     metoprolol tartrate (LOPRESSOR) 25 MG tablet Take 25 mg by mouth 2 (two) times daily.     nitroGLYCERIN (NITROSTAT) 0.4 MG SL tablet Place 0.4 mg under the tongue every 5 (five) minutes as needed for chest pain.     ondansetron (ZOFRAN) 4 MG tablet Take 4 mg by mouth 2 (two) times daily.     sevelamer carbonate (RENVELA) 800 MG tablet Take 800 mg by mouth with breakfast, with lunch, and with evening meal.     vitamin B-12 (CYANOCOBALAMIN) 500 MCG tablet Take 500 mcg by mouth daily.     No current facility-administered medications on file prior to visit.    ALLERGIES: Allergies  Allergen Reactions   Pholcodine Other (See Comments)    GI intolerance  Codeine     GI issues    FAMILY HISTORY: Family History  Problem Relation Age of Onset   Asthma Father     SOCIAL HISTORY: Social History   Socioeconomic History   Marital status: Widowed    Spouse name: Not on file   Number of children: Not on file   Years of education: Not on file   Highest education level: Not on file  Occupational History   Not on file  Tobacco Use   Smoking status: Former    Current packs/day: 0.00    Types: Cigarettes    Quit date: 2019    Years since quitting: 5.9   Smokeless tobacco: Never  Vaping Use   Vaping status: Never Used  Substance and Sexual Activity   Alcohol use: Not Currently   Drug use: No   Sexual activity: Not Currently  Other Topics Concern   Not on file  Social History Narrative   Right handed    Social Determinants of Health   Financial Resource Strain: Not on file  Food Insecurity: Not  on file  Transportation Needs: Not on file  Physical Activity: Not on file  Stress: Not on file  Social Connections: Not on file  Intimate Partner Violence: Not on file     PHYSICAL EXAM: Vitals:   02/20/23 0936  BP: 111/63  Pulse: 84  SpO2: 97%   General: No acute distress Head:  Normocephalic/atraumatic Skin/Extremities: No rash, no edema Neurological Exam: alert and awake. No aphasia or dysarthria. Fund of knowledge is appropriate.  Attention and concentration are normal.   Cranial nerves: Pupils equal, round. Extraocular movements intact with no nystagmus. Visual fields full.  No facial asymmetry.  Motor: Bulk and tone normal, muscle strength 5/5 throughout with no pronator drift.   Finger to nose testing intact.  Gait narrow-based and steady, no ataxia. No tremors.    IMPRESSION: This is a pleasant 67 yo RH woman with a history of hyperlipidemia, CAD s/p CABG, ESRD on HD, bilateral carotid artery stenosis (?occlusion), left ACA stroke in February 2018, with subsequent focal seizures. She describes a sensation of inability to control her right leg, with right leg and arm shaking. Family also described episodes of loss of consciousness with no clear shaking. EEG showed occasional left frontotemporal slowing, no epileptiform discharges. She has been seizure-free since 2020 on Levetiracetam 500mg  BID with additional dose after HD, refills sent. Continue aspirin, Plavix, control of vascular risk factors for secondary stroke prevention. She does not drive. Follow-up in 1 year, call for any changes.    Thank you for allowing me to participate in her care.  Please do not hesitate to call for any questions or concerns.    Patrcia Dolly, M.D.   CC: Leane Call, PA-C

## 2023-02-20 NOTE — Patient Instructions (Signed)
Always good to see you. Continue all your medications. Follow-up in 1 year, call for any changes. ° ° °Seizure Precautions: °1. If medication has been prescribed for you to prevent seizures, take it exactly as directed.  Do not stop taking the medicine without talking to your doctor first, even if you have not had a seizure in a long time.  ° °2. Avoid activities in which a seizure would cause danger to yourself or to others.  Don't operate dangerous machinery, swim alone, or climb in high or dangerous places, such as on ladders, roofs, or girders.  Do not drive unless your doctor says you may. ° °3. If you have any warning that you may have a seizure, lay down in a safe place where you can't hurt yourself.   ° °4.  No driving for 6 months from last seizure, as per [place] state law.   Please refer to the following link on the [employer]ile[employer] of [employer]'s website for more information: http://www.epilepsyfoundation.org/answerplace/Social/driving/drivingu.cfm  ° °5.  Maintain good sleep hygiene. Avoid alcohol. ° °6.  Contact your doctor if you have any problems that may be related to the medicine you are taking. ° °7.  Call 911 and bring the patient back to the ED if: °      ° A.  The seizure lasts longer than 5 minutes.      ° B.  The patient doesn't awaken shortly after the seizure ° C.  The patient has new problems such as difficulty seeing, speaking or moving ° D.  The patient was injured during the seizure ° E.  The patient has a temperature over 102 F (39C) ° F.  The patient vomited and now is having trouble breathing °      ° °

## 2023-04-03 ENCOUNTER — Other Ambulatory Visit: Payer: Self-pay | Admitting: Neurology

## 2023-05-26 ENCOUNTER — Other Ambulatory Visit: Payer: Self-pay | Admitting: Allergy and Immunology

## 2023-06-25 ENCOUNTER — Other Ambulatory Visit: Payer: Self-pay | Admitting: Allergy and Immunology

## 2023-07-21 ENCOUNTER — Other Ambulatory Visit: Payer: Self-pay | Admitting: Allergy and Immunology

## 2023-08-19 ENCOUNTER — Other Ambulatory Visit: Payer: Self-pay | Admitting: Allergy and Immunology

## 2023-08-22 ENCOUNTER — Other Ambulatory Visit: Payer: Self-pay | Admitting: Allergy and Immunology

## 2023-08-27 ENCOUNTER — Other Ambulatory Visit: Payer: Self-pay | Admitting: *Deleted

## 2023-08-27 MED ORDER — ALBUTEROL SULFATE HFA 108 (90 BASE) MCG/ACT IN AERS: INHALATION_SPRAY | RESPIRATORY_TRACT | 0 refills | Status: DC

## 2023-09-06 ENCOUNTER — Ambulatory Visit (INDEPENDENT_AMBULATORY_CARE_PROVIDER_SITE_OTHER): Admitting: Allergy and Immunology

## 2023-09-06 ENCOUNTER — Encounter: Payer: Self-pay | Admitting: Allergy and Immunology

## 2023-09-06 VITALS — BP 132/68 | HR 72 | Resp 12 | Ht <= 58 in | Wt 176.6 lb

## 2023-09-06 DIAGNOSIS — K219 Gastro-esophageal reflux disease without esophagitis: Secondary | ICD-10-CM | POA: Diagnosis not present

## 2023-09-06 DIAGNOSIS — J4489 Other specified chronic obstructive pulmonary disease: Secondary | ICD-10-CM

## 2023-09-06 DIAGNOSIS — J3089 Other allergic rhinitis: Secondary | ICD-10-CM | POA: Diagnosis not present

## 2023-09-06 NOTE — Progress Notes (Signed)
 [place] - [place] - [place] - [place] - [place]   Follow-up Note  Referring Provider: Hunter Mickey Browner, PA-C Primary Provider: Hunter Mickey Browner, PA-C Date of Office Visit: 09/06/2023  Subjective:   Bethany Sosa (DOB: July 04, 1955) is a 68 y.o. female who [facility] in re-evaluation of the following:  HPI: Mally returns to this clinic in evaluation of COPD/asthma overlap, allergic rhinitis, LPR.  I last saw her in this clinic 01 June 2022.  She has really done very well since her last visit not requiring a systemic steroid or antibiotic for any type of airway issue.  Rarely does she use the short acting bronchodilator.  She continues to use Breztri  twice a day.  She has had very little problems with reflux.  She uses her Prevacid as needed.  Allergies as of 09/06/2023       Reactions   Pholcodine Other (See Comments)   GI intolerance    Codeine    GI issues        Medication List    acetaminophen  500 MG tablet Commonly known as: TYLENOL  Take 1,000 mg by mouth every 8 (eight) hours as needed for moderate pain.   albuterol  108 (90 Base) MCG/ACT inhaler Commonly known as: Ventolin  HFA Inhale two puffs every 4-6 hours if needed for cough or wheeze.   aspirin  EC 81 MG tablet Take 81 mg by mouth daily. Swallow whole.   atorvastatin  80 MG tablet Commonly known as: LIPITOR Take 80 mg by mouth daily.   Breztri  Aerosphere 160-9-4.8 MCG/ACT Aero inhaler Generic drug: budesonide -glycopyrrolate-formoterol  INHALE 2 PUFFS IN THE MORNING AND 2 AT BEDTIME   clopidogrel  75 MG tablet Commonly known as: PLAVIX  Take 75 mg by mouth daily.   COLLAGEN PO Take 1 capsule by mouth in the morning, at noon, and at bedtime.   hydrOXYzine  25 MG tablet Commonly known as: ATARAX  Take 25 mg by mouth every 6 (six) hours as needed for anxiety.   lansoprazole 30 MG capsule Commonly known as: PREVACID Take 30 mg by mouth daily  at 12 noon.   levETIRAcetam  500 MG tablet Commonly known as: KEPPRA  TAKE 1 TABLET BY MOUTH TWICE DAILY -  TAKE  ADDITIONAL  TABLET  AFTER  DIALYSIS   levothyroxine  125 MCG tablet Commonly known as: SYNTHROID  Take 125 mcg by mouth every morning.   levothyroxine  200 MCG tablet Commonly known as: SYNTHROID  Take 200 mcg by mouth daily before breakfast.   metoprolol  succinate 25 MG 24 hr tablet Commonly known as: TOPROL -XL Take 25 mg by mouth at bedtime.   metoprolol  tartrate 25 MG tablet Commonly known as: LOPRESSOR  Take 25 mg by mouth 2 (two) times daily.   nitroGLYCERIN  0.4 MG SL tablet Commonly known as: NITROSTAT  Place 0.4 mg under the tongue every 5 (five) minutes as needed for chest pain.   ondansetron  4 MG tablet Commonly known as: ZOFRAN  Take 4 mg by mouth 2 (two) times daily.   sevelamer carbonate 800 MG tablet Commonly known as: RENVELA Take 800 mg by mouth with breakfast, with lunch, and with evening meal.   vitamin B-12 500 MCG tablet Commonly known as: CYANOCOBALAMIN Take 500 mcg by mouth daily.    Past Medical History:  Diagnosis Date   Allergic rhinitis    Anxiety    Arthritis    Asthma    Carotid artery occlusion    bilateral ICA occlusions by neck CTA, diagnosed 04/2016   COPD (chronic obstructive  pulmonary disease) (HCC)    Coronary artery disease    ESRD (end stage renal disease) (HCC)    hemodialysis initiated 04/09/20 Dialysis on M/W/F   GERD (gastroesophageal reflux disease)    Heart murmur    History of hysterectomy    Hypothyroidism    Myocardial infarction [facility])    NSTEMI, s/p DES SVG-OM1 04/30/20   Seizures (HCC)    Stroke (HCC) 04/23/2016   acute/subacute left frontal CVA    Past Surgical History:  Procedure Laterality Date   A/V FISTULAGRAM N/A 12/16/2020   Procedure: A/V FISTULAGRAM;  Surgeon: Gretta Lonni PARAS, MD;  Location: [facility] INVASIVE CV LAB;  Service: Cardiovascular;  Laterality: N/A;   APPENDECTOMY     AV FISTULA  PLACEMENT Left 10/19/2020   Procedure: LEFT BRACHIOCEPHALIC ARTERIOVENOUS (AV) FISTULA CREATION;  Surgeon: Harvey Carlin BRAVO, MD;  Location: [facility] OR;  Service: Vascular;  Laterality: Left;   CAROTID ENDARTERECTOMY Right 06/13/2005   CORONARY ARTERY BYPASS GRAFT  06/13/2005   CABG: LIMA-LAD, SVG-OM, SVG-RCA-PDA 06/13/05   LIGATION OF COMPETING BRANCHES OF ARTERIOVENOUS FISTULA Left 12/31/2020   Procedure: SIDE BRANCH LIGATION x3;  Surgeon: Gretta Lonni PARAS, MD;  Location: [facility] OR;  Service: Vascular;  Laterality: Left;   open heart surgery     REVISON OF ARTERIOVENOUS FISTULA Left 12/31/2020   Procedure: LEFT ARM ARTERIOVENOUS FISTULA REVISION;  Surgeon: Gretta Lonni PARAS, MD;  Location: [facility] OR;  Service: Vascular;  Laterality: Left;   TOTAL HIP ARTHROPLASTY Right 05/31/2021   Procedure: TOTAL HIP ARTHROPLASTY ANTERIOR APPROACH;  Surgeon: Ernie Cough, MD;  Location: [facility] ORS;  Service: Orthopedics;  Laterality: Right;    Review of systems negative except as noted in HPI / PMHx or noted below:  Review of Systems  Constitutional: Negative.   HENT: Negative.    Eyes: Negative.   Respiratory: Negative.    Cardiovascular: Negative.   Gastrointestinal: Negative.   Genitourinary: Negative.   Musculoskeletal: Negative.   Skin: Negative.   Neurological: Negative.   Endo/Heme/Allergies: Negative.   Psychiatric/Behavioral: Negative.       Objective:   Vitals:   09/06/23 0826  BP: 132/68  Pulse: 72  Resp: 12  SpO2: 96%   Height: 4' 10 (147.3 cm)  Weight: 176 lb 9.6 oz (80.1 kg)   Physical Exam Constitutional:      Appearance: She is not diaphoretic.  HENT:     Head: Normocephalic.     Right Ear: Tympanic membrane, ear canal and external ear normal.     Left Ear: Tympanic membrane, ear canal and external ear normal.     Nose: Nose normal. No mucosal edema or rhinorrhea.     Mouth/Throat:     Pharynx: Uvula midline. No oropharyngeal exudate.   Eyes:     Conjunctiva/sclera:  Conjunctivae normal.   Neck:     Thyroid: No thyromegaly.     Trachea: Trachea normal. No tracheal tenderness or tracheal deviation.   Cardiovascular:     Rate and Rhythm: Normal rate and regular rhythm.     Heart sounds: Normal heart sounds, S1 normal and S2 normal. No murmur heard. Pulmonary:     Effort: No respiratory distress.     Breath sounds: Normal breath sounds. No stridor. No wheezing or rales.  Lymphadenopathy:     Head:     Right side of head: No tonsillar adenopathy.     Left side of head: No tonsillar adenopathy.     Cervical: No cervical adenopathy.   Skin:  Findings: No erythema or rash.     Nails: There is no clubbing.   Neurological:     Mental Status: She is alert.     Diagnostics: Spirometry was performed and demonstrated an FEV1 of 0.98 at 55 % of predicted.  Assessment and Plan:   1. Asthma with COPD (HCC)   2. Other allergic rhinitis   3. LPRD (laryngopharyngeal reflux disease)     1.  Continue to treat and prevent inflammation:   A. Breztri  - 2 inhalations 2 times per day  2. Continue reflux treatment:   A. Minimize caffeine and chocolate consumption  B. Prevacid OTC - 1 time per day   3. If needed:   A. Proair  HFA or albuterol  nebulization   B. Antihistamine  4. Return in 1 year or earlier if problem.     5. Influenza = Tamiflu. Covid = molnupiravir / Paxlovid (renal dose)  Kynsie appears to be doing very well and she will continue on Breztri  utilized 1 or 2 times per day depending on disease activity and she will continue to use therapy directed against reflux as needed as noted above.  Assuming she does well with this plan I will see her back in this clinic in 1 year or earlier if there is a problem.  She does have end-stage renal disease requiring dialysis and should she be infected with COVID and require an antiviral agent she will need to check with her nephrologist about best choice for an antiviral agent.  Camellia Denis,  MD Allergy / Immunology [facility]

## 2023-09-06 NOTE — Patient Instructions (Addendum)
  1.  Continue to treat and prevent inflammation:   A. Breztri  - 2 inhalations 2 times per day  2. Continue reflux treatment:   A. Minimize caffeine and chocolate consumption  B. Prevacid OTC - 1 time per day   3. If needed:   A. Proair  HFA or albuterol  nebulization   B. Antihistamine  4. Return in 1 year or earlier if problem.     5. Influenza = Tamiflu. Covid = molnupiravir / Paxlovid (renal dose)

## 2023-09-10 ENCOUNTER — Encounter: Payer: Self-pay | Admitting: Allergy and Immunology

## 2023-09-17 ENCOUNTER — Other Ambulatory Visit: Payer: Self-pay | Admitting: Allergy and Immunology

## 2023-10-16 ENCOUNTER — Other Ambulatory Visit: Payer: Self-pay | Admitting: Allergy and Immunology

## 2023-11-28 ENCOUNTER — Other Ambulatory Visit: Payer: Self-pay | Admitting: Allergy and Immunology

## 2023-12-10 ENCOUNTER — Other Ambulatory Visit: Payer: Self-pay | Admitting: Allergy and Immunology

## 2023-12-10 DIAGNOSIS — J4489 Other specified chronic obstructive pulmonary disease: Secondary | ICD-10-CM

## 2024-01-08 ENCOUNTER — Other Ambulatory Visit: Payer: Self-pay | Admitting: Allergy and Immunology

## 2024-01-08 DIAGNOSIS — J4489 Other specified chronic obstructive pulmonary disease: Secondary | ICD-10-CM

## 2024-02-05 ENCOUNTER — Other Ambulatory Visit: Payer: Self-pay | Admitting: Allergy and Immunology

## 2024-02-05 DIAGNOSIS — J4489 Other specified chronic obstructive pulmonary disease: Secondary | ICD-10-CM

## 2024-03-10 ENCOUNTER — Other Ambulatory Visit: Payer: Self-pay | Admitting: Allergy and Immunology

## 2024-03-10 DIAGNOSIS — J4489 Other specified chronic obstructive pulmonary disease: Secondary | ICD-10-CM
